# Patient Record
Sex: Female | Born: 1950 | Race: White | Hispanic: No | Marital: Married | Smoking: Never smoker
Health system: Southern US, Community
[De-identification: ages and names within clinical notes are randomized; demographics above are authoritative.]

## PROBLEM LIST (undated history)

## (undated) DIAGNOSIS — N183 Chronic kidney disease, stage 3 (moderate): Secondary | ICD-10-CM

## (undated) DIAGNOSIS — D631 Anemia in chronic kidney disease: Secondary | ICD-10-CM

## (undated) DIAGNOSIS — D8989 Other specified disorders involving the immune mechanism, not elsewhere classified: Secondary | ICD-10-CM

## (undated) DIAGNOSIS — C801 Malignant (primary) neoplasm, unspecified: Secondary | ICD-10-CM

## (undated) DIAGNOSIS — N289 Disorder of kidney and ureter, unspecified: Secondary | ICD-10-CM

## (undated) DIAGNOSIS — I10 Essential (primary) hypertension: Secondary | ICD-10-CM

## (undated) DIAGNOSIS — Z7189 Other specified counseling: Secondary | ICD-10-CM

## (undated) HISTORY — PX: NO PAST SURGERIES: SHX2092

## (undated) HISTORY — DX: Other specified disorders involving the immune mechanism, not elsewhere classified: D89.89

## (undated) HISTORY — DX: Other specified counseling: Z71.89

## (undated) HISTORY — DX: Chronic kidney disease, stage 3 (moderate): N18.3

## (undated) HISTORY — DX: Anemia in chronic kidney disease: D63.1

---

## 2016-09-30 ENCOUNTER — Ambulatory Visit: Payer: Self-pay | Admitting: Hematology & Oncology

## 2016-09-30 ENCOUNTER — Ambulatory Visit: Payer: Medicare (Managed Care)

## 2016-09-30 ENCOUNTER — Ambulatory Visit (HOSPITAL_BASED_OUTPATIENT_CLINIC_OR_DEPARTMENT_OTHER): Payer: Medicare (Managed Care) | Admitting: Hematology & Oncology

## 2016-09-30 ENCOUNTER — Other Ambulatory Visit: Payer: Self-pay

## 2016-09-30 ENCOUNTER — Other Ambulatory Visit (HOSPITAL_BASED_OUTPATIENT_CLINIC_OR_DEPARTMENT_OTHER): Payer: Medicare (Managed Care)

## 2016-09-30 VITALS — BP 128/74 | HR 80 | Temp 97.7°F | Resp 16 | Ht 60.0 in | Wt 95.0 lb

## 2016-09-30 DIAGNOSIS — R634 Abnormal weight loss: Secondary | ICD-10-CM | POA: Diagnosis not present

## 2016-09-30 DIAGNOSIS — D72819 Decreased white blood cell count, unspecified: Secondary | ICD-10-CM | POA: Diagnosis not present

## 2016-09-30 DIAGNOSIS — F0390 Unspecified dementia without behavioral disturbance: Secondary | ICD-10-CM | POA: Diagnosis not present

## 2016-09-30 DIAGNOSIS — D709 Neutropenia, unspecified: Secondary | ICD-10-CM | POA: Diagnosis not present

## 2016-09-30 DIAGNOSIS — C9000 Multiple myeloma not having achieved remission: Secondary | ICD-10-CM | POA: Diagnosis not present

## 2016-09-30 DIAGNOSIS — Z992 Dependence on renal dialysis: Secondary | ICD-10-CM

## 2016-09-30 DIAGNOSIS — D649 Anemia, unspecified: Secondary | ICD-10-CM | POA: Diagnosis not present

## 2016-09-30 DIAGNOSIS — N19 Unspecified kidney failure: Secondary | ICD-10-CM

## 2016-09-30 LAB — CBC WITH DIFFERENTIAL (CANCER CENTER ONLY)
BASO#: 0 10*3/uL (ref 0.0–0.2)
BASO%: 0 % (ref 0.0–2.0)
EOS%: 0 % (ref 0.0–7.0)
Eosinophils Absolute: 0 10*3/uL (ref 0.0–0.5)
HEMATOCRIT: 23.5 % — AB (ref 34.8–46.6)
HGB: 7.7 g/dL — ABNORMAL LOW (ref 11.6–15.9)
LYMPH#: 0.4 10*3/uL — AB (ref 0.9–3.3)
LYMPH%: 25.2 % (ref 14.0–48.0)
MCH: 30 pg (ref 26.0–34.0)
MCHC: 32.8 g/dL (ref 32.0–36.0)
MCV: 91 fL (ref 81–101)
MONO#: 0.2 10*3/uL (ref 0.1–0.9)
MONO%: 11.9 % (ref 0.0–13.0)
NEUT%: 62.9 % (ref 39.6–80.0)
NEUTROS ABS: 0.9 10*3/uL — AB (ref 1.5–6.5)
Platelets: 91 10*3/uL — ABNORMAL LOW (ref 145–400)
RBC: 2.57 10*6/uL — ABNORMAL LOW (ref 3.70–5.32)
RDW: 13.5 % (ref 11.1–15.7)
WBC: 1.4 10*3/uL — ABNORMAL LOW (ref 3.9–10.0)

## 2016-09-30 LAB — CMP (CANCER CENTER ONLY)
ALK PHOS: 245 U/L — AB (ref 26–84)
ALT: 48 U/L — AB (ref 10–47)
AST: 29 U/L (ref 11–38)
Albumin: 3.3 g/dL (ref 3.3–5.5)
BILIRUBIN TOTAL: 0.6 mg/dL (ref 0.20–1.60)
BUN: 42 mg/dL — AB (ref 7–22)
CO2: 27 meq/L (ref 18–33)
CREATININE: 2.2 mg/dL — AB (ref 0.6–1.2)
Calcium: 8.3 mg/dL (ref 8.0–10.3)
Chloride: 98 mEq/L (ref 98–108)
GLUCOSE: 89 mg/dL (ref 73–118)
Potassium: 3.2 mEq/L — ABNORMAL LOW (ref 3.3–4.7)
SODIUM: 138 meq/L (ref 128–145)
Total Protein: 8.7 g/dL — ABNORMAL HIGH (ref 6.4–8.1)

## 2016-09-30 LAB — IRON AND TIBC
%SAT: 59 % — ABNORMAL HIGH (ref 21–57)
Iron: 140 ug/dL (ref 41–142)
TIBC: 240 ug/dL (ref 236–444)
UIBC: 100 ug/dL — AB (ref 120–384)

## 2016-09-30 LAB — FERRITIN

## 2016-09-30 LAB — CHCC SATELLITE - SMEAR

## 2016-09-30 MED ORDER — FAMCICLOVIR 250 MG PO TABS
250.0000 mg | ORAL_TABLET | Freq: Every day | ORAL | 8 refills | Status: DC
Start: 2016-09-30 — End: 2017-07-04

## 2016-09-30 MED ORDER — DEXAMETHASONE 4 MG PO TABS: ORAL_TABLET | ORAL | 1 refills | Status: DC

## 2016-09-30 MED ORDER — MEGESTROL ACETATE 625 MG/5ML PO SUSP
625.0000 mg | Freq: Every day | ORAL | 4 refills | Status: DC
Start: 2016-09-30 — End: 2016-10-25

## 2016-09-30 NOTE — Progress Notes (Signed)
Referral MD  Reason for Referral: IgA  Kappa myeloma                                   ARF - on HD  Chief Complaint  Patient presents with  . New Patient (Initial Visit)  : Patient really cannot give much history.  HPI: Mrs. Gaillard is a very nice 66 year old white female. She actually just got moved down here from [facility]. She apparently has been in decent health until about 2 or 3 weeks ago. She had confusion. She was living with her husband. She was not eating. She's lost quite a bit overweight. She is not urinating much. Patient went to a local hospital. She was found to be in acute renal failure. Studies ultimately show that she had myeloma. She had bony lesions on bone survey. She had a CT scan which confirmed bony lytic lesions. She was hypercalcemic. Her BUN was 190 and her creatinine was 13.  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 She had over 1200 mg/dL of IgA protein. Her Kappa Lightchain was 388 mg/dL. In her urine, her Kappa Lightchain was 8000 mg/L.  She was started on dialysis area and I think she may have had 1 or 2 treatments.  Her son lives down here. He is a Art therapist were for the [employer] police department.  She really cannot give too much history because of progressive dementia over the past couple years.  She had a bone marrow test done. I do not have these results.  She was started on  treatment with Velcade/Cytoxan/Decadron. She's only had 1 or 2 treatments.  Her big problem is the weight loss. She now weighs 95 pounds.  She's not taking much in the way of pain medication.  She was seen orthopedist next week for the compression fractures in her back area and she may be a candidate for kyphoplasty.  She's had no diarrhea. She's had no cough. Again she had a little pneumonia when she was hospitalized up in [place].  She and her husband have moved down here. It sounds like this will be a permanent move for them.  There are, he referred to the [facility] for evaluation and management.  She'll start her dialysis tomorrow. Her last dialysis was on Monday. She really has only been in town for a couple days.   She's had no fever.   She has been transfused. Her family is not sure how much she has been transfused.   She's had no leg swelling.   Overall, I would say performance status is ECOG 2-3.   No past medical history on file.:  No past surgical history on file.:   Current Outpatient Prescriptions:  .  allopurinol (ZYLOPRIM) 100 MG tablet, Take 100 mg by mouth daily., Disp: , Rfl: 11 .  cefUROXime (CEFTIN) 250 MG tablet, TAKE 1 TABLET (250 MG TOTAL) BY MOUTH 2 (TWO) TIMES A DAY FOR 8 DOSES., Disp: , Rfl: 0 .  dexamethasone (DECADRON) 4 MG tablet, Take 5 pills once a week with food., Disp: 100 tablet, Rfl: 1 .  famciclovir (FAMVIR) 250 MG tablet, Take 1 tablet (250 mg total) by mouth daily., Disp: 30 tablet, Rfl: 8 .  megestrol (MEGACE ES) 625 MG/5ML suspension, Take 5 mLs (625 mg total) by mouth daily., Disp: 150 mL, Rfl: 4 .  oxyCODONE-acetaminophen (PERCOCET/ROXICET) 5-325 MG tablet, TAKE 1 TABLET BY MOUTH EVERY 4 HOURS AS NEEDED FOR MODERATE PAIN MAX DAILY AMOUNT 6 TABLETS, Disp: , Rfl: 0 .  Risedronate Sodium (ATELVIA) 35 MG TBEC, Atelvia 35 mg tablet,delayed release  Take 1 tablet every week by oral route., Disp: , Rfl: :  :  Allergies   Allergen  Reactions  . No Known Allergies   :  No family history on file.:  Social History   Social History  . Marital status: Married    Spouse name: N/A  . Number of children: N/A  . Years of education: N/A   Occupational History  . Not on file.   Social History Main Topics  . Smoking status: Not on file  . Smokeless tobacco: Not on file  . Alcohol use Not on file  . Drug use: Unknown  . Sexual activity: Not on file   Other Topics Concern  . Not on file   Social History Narrative  . No narrative on file  :  Pertinent items are noted in HPI.  Exam:Thin, somewhat elderly appearing white female in no obvious distress. Vital signs are temperature 97.7. Pulse 80. Blood pressure 128/74. Weight is 95 pounds. Head exam shows some temporal muscle wasting. She has no adenopathy in the neck. There is no scleral icterus. Lungs are relatively clear bilaterally. Cardiac exam regular rate and rhythm with no murmurs, rubs or bruits. Abdomen is soft. She has no fluid wave. There is no palpable abdominal mass. There is no palpable liver or spleen tip. Back exam shows most logically in the back. She has no tenderness over the spine. Extremities shows some a/renal per lower extremities. She has decent range of motion of her joints. Neurological exam shows no obvious neurological deficits. Skin exam shows some scattered ecchymoses.   Recent Labs  09/30/16 1131  WBC 1.4*  HGB 7.7*  HCT 23.5*  PLT 91*    Recent Labs  09/30/16 1131  NA 138  K 3.2*  CL 98  CO2 27  GLUCOSE 89  BUN 42*  CREATININE 2.2*  CALCIUM 8.3    Blood smear review:  None  Pathology: None     Assessment and Plan:  Mrs. Clinkscales is a 66 year old white female. She has advanced myeloma. She has renal failure. She presented with renal failure. I suspect that she will probably be on dialysis long term.  Is clear that it is her light chains that are the problem. She has a very high level of Kappa  Lightchains. Kappa Lightchains are the sub-type of light chain that really can cause kidney failure. Again, I suspect that she probably is not going to get off of dialysis even if we normalize her light chains.  I think that Velcade/Cytoxan/Decadron would be a very good idea for her. Cytoxan we can use with renal failure. Velcade we can dose for renal failure.  One issue is just her weight loss. She really has lost a tunnel weight. I'll put her on some Megace to see this can help a little bit.  We will try to get started next week. We really need to get started. I would use twice a week Velcade with her. I think this would be helpful to try to bring down the myeloma levels more quickly.  We will have to follow-up on her lab work. She is leukopenic. She is anemic and thrombocytopenic. Again we have to check her blood counts before we treat her. We may have to consider a transfusion.  This is an incredibly complicated case. A lot is going on. She can clearly end of in the hospital with complications.  I spent over an hour with she and her family. There are all very nice. It was really fun talking to them about [facility]. She actually lived close to where I lived when I did  my internal medicine training at the [facility].  We are basically going to have to follow her weekly for right now.

## 2016-10-01 LAB — IGG, IGA, IGM
IGM (IMMUNOGLOBIN M), SRM: 14 mg/dL — AB (ref 26–217)
IgG, Qn, Serum: 300 mg/dL — ABNORMAL LOW (ref 700–1600)

## 2016-10-01 LAB — BETA 2 MICROGLOBULIN, SERUM: Beta-2: 4.3 mg/L — ABNORMAL HIGH (ref 0.6–2.4)

## 2016-10-03 LAB — KAPPA/LAMBDA LIGHT CHAINS
IG KAPPA FREE LIGHT CHAIN: 71.9 mg/L — AB (ref 3.3–19.4)
Ig Lambda Free Light Chain: 1.8 mg/L — ABNORMAL LOW (ref 5.7–26.3)
KAPPA/LAMBDA FLC RATIO: 39.94 — AB (ref 0.26–1.65)

## 2016-10-04 ENCOUNTER — Ambulatory Visit (HOSPITAL_BASED_OUTPATIENT_CLINIC_OR_DEPARTMENT_OTHER): Payer: Medicare (Managed Care)

## 2016-10-04 VITALS — BP 109/65 | HR 101 | Temp 97.8°F | Resp 18

## 2016-10-04 DIAGNOSIS — Z5112 Encounter for antineoplastic immunotherapy: Secondary | ICD-10-CM

## 2016-10-04 DIAGNOSIS — Z5111 Encounter for antineoplastic chemotherapy: Secondary | ICD-10-CM | POA: Diagnosis not present

## 2016-10-04 DIAGNOSIS — C9000 Multiple myeloma not having achieved remission: Secondary | ICD-10-CM

## 2016-10-04 MED ORDER — PALONOSETRON HCL INJECTION 0.25 MG/5ML
INTRAVENOUS | Status: AC
  Filled 2016-10-04: qty 5

## 2016-10-04 MED ORDER — SODIUM CHLORIDE 0.9 % IV SOLN
300.0000 mg/m2 | Freq: Once | INTRAVENOUS | Status: AC
  Administered 2016-10-04: 420 mg via INTRAVENOUS
  Filled 2016-10-04: qty 21

## 2016-10-04 MED ORDER — PALONOSETRON HCL INJECTION 0.25 MG/5ML
0.2500 mg | Freq: Once | INTRAVENOUS | Status: AC
  Administered 2016-10-04: 0.25 mg via INTRAVENOUS

## 2016-10-04 MED ORDER — BORTEZOMIB CHEMO SQ INJECTION 3.5 MG (2.5MG/ML)
1.3000 mg/m2 | Freq: Once | INTRAMUSCULAR | Status: AC
  Administered 2016-10-04: 1.75 mg via SUBCUTANEOUS
  Filled 2016-10-04: qty 1.75

## 2016-10-04 NOTE — Patient Instructions (Signed)
Cyclophosphamide injection What is this medicine? CYCLOPHOSPHAMIDE (sye kloe FOSS fa mide) is a chemotherapy drug. It slows the growth of cancer cells. This medicine is used to treat many types of cancer like lymphoma, myeloma, leukemia, breast cancer, and ovarian cancer, to name a few. This medicine may be used for other purposes; ask your health care provider or pharmacist if you have questions. COMMON BRAND NAME(S): Cytoxan, Neosar What should I tell my health care provider before I take this medicine? They need to know if you have any of these conditions: -blood disorders -history of other chemotherapy -infection -kidney disease -liver disease -recent or ongoing radiation therapy -tumors in the bone marrow -an unusual or allergic reaction to cyclophosphamide, other chemotherapy, other medicines, foods, dyes, or preservatives -pregnant or trying to get pregnant -breast-feeding How should I use this medicine? This drug is usually given as an injection into a vein or muscle or by infusion into a vein. It is administered in a hospital or clinic by a specially trained health care professional. Talk to your pediatrician regarding the use of this medicine in children. Special care may be needed. Overdosage: If you think you have taken too much of this medicine contact a poison control center or emergency room at once. NOTE: This medicine is only for you. Do not share this medicine with others. What if I miss a dose? It is important not to miss your dose. Call your doctor or health care professional if you are unable to keep an appointment. What may interact with this medicine? This medicine may interact with the following medications: -amiodarone -amphotericin B -azathioprine -certain antiviral medicines for HIV or AIDS such as protease inhibitors (e.g., indinavir, ritonavir) and zidovudine -certain blood pressure medications such as benazepril, captopril, enalapril, fosinopril,  lisinopril, moexipril, monopril, perindopril, quinapril, ramipril, trandolapril -certain cancer medications such as anthracyclines (e.g., daunorubicin, doxorubicin), busulfan, cytarabine, paclitaxel, pentostatin, tamoxifen, trastuzumab -certain diuretics such as chlorothiazide, chlorthalidone, hydrochlorothiazide, indapamide, metolazone -certain medicines that treat or prevent blood clots like warfarin -certain muscle relaxants such as succinylcholine -cyclosporine -etanercept -indomethacin -medicines to increase blood counts like filgrastim, pegfilgrastim, sargramostim -medicines used as general anesthesia -metronidazole -natalizumab This list may not describe all possible interactions. Give your health care provider a list of all the medicines, herbs, non-prescription drugs, or dietary supplements you use. Also tell them if you smoke, drink alcohol, or use illegal drugs. Some items may interact with your medicine. What should I watch for while using this medicine? Visit your doctor for checks on your progress. This drug may make you feel generally unwell. This is not uncommon, as chemotherapy can affect healthy cells as well as cancer cells. Report any side effects. Continue your course of treatment even though you feel ill unless your doctor tells you to stop. Drink water or other fluids as directed. Urinate often, even at night. In some cases, you may be given additional medicines to help with side effects. Follow all directions for their use. Call your doctor or health care professional for advice if you get a fever, chills or sore throat, or other symptoms of a cold or flu. Do not treat yourself. This drug decreases your body's ability to fight infections. Try to avoid being around people who are sick. This medicine may increase your risk to bruise or bleed. Call your doctor or health care professional if you notice any unusual bleeding. Be careful brushing and flossing your teeth or using a  toothpick because you may get an infection or bleed   more easily. If you have any dental work done, tell your dentist you are receiving this medicine. You may get drowsy or dizzy. Do not drive, use machinery, or do anything that needs mental alertness until you know how this medicine affects you. Do not become pregnant while taking this medicine or for 1 year after stopping it. Women should inform their doctor if they wish to become pregnant or think they might be pregnant. Men should not father a child while taking this medicine and for 4 months after stopping it. There is a potential for serious side effects to an unborn child. Talk to your health care professional or pharmacist for more information. Do not breast-feed an infant while taking this medicine. This medicine may interfere with the ability to have a child. This medicine has caused ovarian failure in some women. This medicine has caused reduced sperm counts in some men. You should talk with your doctor or health care professional if you are concerned about your fertility. If you are going to have surgery, tell your doctor or health care professional that you have taken this medicine. What side effects may I notice from receiving this medicine? Side effects that you should report to your doctor or health care professional as soon as possible: -allergic reactions like skin rash, itching or hives, swelling of the face, lips, or tongue -low blood counts - this medicine may decrease the number of white blood cells, red blood cells and platelets. You may be at increased risk for infections and bleeding. -signs of infection - fever or chills, cough, sore throat, pain or difficulty passing urine -signs of decreased platelets or bleeding - bruising, pinpoint red spots on the skin, black, tarry stools, blood in the urine -signs of decreased red blood cells - unusually weak or tired, fainting spells, lightheadedness -breathing problems -dark  urine -dizziness -palpitations -swelling of the ankles, feet, hands -trouble passing urine or change in the amount of urine -weight gain -yellowing of the eyes or skin Side effects that usually do not require medical attention (report to your doctor or health care professional if they continue or are bothersome): -changes in nail or skin color -hair loss -missed menstrual periods -mouth sores -nausea, vomiting This list may not describe all possible side effects. Call your doctor for medical advice about side effects. You may report side effects to FDA at 1-800-FDA-1088. Where should I keep my medicine? This drug is given in a hospital or clinic and will not be stored at home. NOTE: This sheet is a summary. It may not cover all possible information. If you have questions about this medicine, talk to your doctor, pharmacist, or health care provider.  2018 Elsevier/Gold Standard (2012-01-27 16:22:58) Bortezomib injection What is this medicine? BORTEZOMIB (bor TEZ oh mib) is a medicine that targets proteins in cancer cells and stops the cancer cells from growing. It is used to treat multiple myeloma and mantle-cell lymphoma. This medicine may be used for other purposes; ask your health care provider or pharmacist if you have questions. COMMON BRAND NAME(S): Velcade What should I tell my health care provider before I take this medicine? They need to know if you have any of these conditions: -diabetes -heart disease -irregular heartbeat -liver disease -on hemodialysis -low blood counts, like low white blood cells, platelets, or hemoglobin -peripheral neuropathy -taking medicine for blood pressure -an unusual or allergic reaction to bortezomib, mannitol, boron, other medicines, foods, dyes, or preservatives -pregnant or trying to get pregnant -breast-feeding How should  I use this medicine? This medicine is for injection into a vein or for injection under the skin. It is given by a  health care professional in a hospital or clinic setting. Talk to your pediatrician regarding the use of this medicine in children. Special care may be needed. Overdosage: If you think you have taken too much of this medicine contact a poison control center or emergency room at once. NOTE: This medicine is only for you. Do not share this medicine with others. What if I miss a dose? It is important not to miss your dose. Call your doctor or health care professional if you are unable to keep an appointment. What may interact with this medicine? This medicine may interact with the following medications: -ketoconazole -rifampin -ritonavir -St. John's Wort This list may not describe all possible interactions. Give your health care provider a list of all the medicines, herbs, non-prescription drugs, or dietary supplements you use. Also tell them if you smoke, drink alcohol, or use illegal drugs. Some items may interact with your medicine. What should I watch for while using this medicine? You may get drowsy or dizzy. Do not drive, use machinery, or do anything that needs mental alertness until you know how this medicine affects you. Do not stand or sit up quickly, especially if you are an older patient. This reduces the risk of dizzy or fainting spells. In some cases, you may be given additional medicines to help with side effects. Follow all directions for their use. Call your doctor or health care professional for advice if you get a fever, chills or sore throat, or other symptoms of a cold or flu. Do not treat yourself. This drug decreases your body's ability to fight infections. Try to avoid being around people who are sick. This medicine may increase your risk to bruise or bleed. Call your doctor or health care professional if you notice any unusual bleeding. You may need blood work done while you are taking this medicine. In some patients, this medicine may cause a serious brain infection that may  cause death. If you have any problems seeing, thinking, speaking, walking, or standing, tell your doctor right away. If you cannot reach your doctor, urgently seek other source of medical care. Check with your doctor or health care professional if you get an attack of severe diarrhea, nausea and vomiting, or if you sweat a lot. The loss of too much body fluid can make it dangerous for you to take this medicine. Do not become pregnant while taking this medicine or for at least 2 months after stopping it. Women should inform their doctor if they wish to become pregnant or think they might be pregnant. Men should not father a child while taking this medicine and for at least 2 months after stopping it. There is a potential for serious side effects to an unborn child. Talk to your health care professional or pharmacist for more information. Do not breast-feed an infant while taking this medicine or for 2 months after stopping it. This medicine may interfere with the ability to have a child. You should talk with your doctor or health care professional if you are concerned about your fertility. What side effects may I notice from receiving this medicine? Side effects that you should report to your doctor or health care professional as soon as possible: -allergic reactions like skin rash, itching or hives, swelling of the face, lips, or tongue -breathing problems -changes in hearing -changes in vision -fast,   irregular heartbeat -feeling faint or lightheaded, falls -pain, tingling, numbness in the hands or feet -right upper belly pain -seizures -swelling of the ankles, feet, hands -unusual bleeding or bruising -unusually weak or tired -vomiting -yellowing of the eyes or skin Side effects that usually do not require medical attention (report to your doctor or health care professional if they continue or are bothersome): -changes in emotions or moods -constipation -diarrhea -loss of  appetite -headache -irritation at site where injected -nausea This list may not describe all possible side effects. Call your doctor for medical advice about side effects. You may report side effects to FDA at 1-800-FDA-1088. Where should I keep my medicine? This drug is given in a hospital or clinic and will not be stored at home. NOTE: This sheet is a summary. It may not cover all possible information. If you have questions about this medicine, talk to your doctor, pharmacist, or health care provider.  2018 Elsevier/Gold Standard (2016-02-11 15:53:51)  

## 2016-10-04 NOTE — Progress Notes (Signed)
OK per Dr Marin Olp to treat patient with Cytoxan/Velcade with low WBC, Hgb & Platelets. dph

## 2016-10-05 ENCOUNTER — Telehealth: Payer: Self-pay

## 2016-10-05 LAB — PROTEIN ELECTROPHORESIS, SERUM, WITH REFLEX
A/G Ratio: 0.8 (ref 0.7–1.7)
ALPHA 2: 0.9 g/dL (ref 0.4–1.0)
Albumin: 3.5 g/dL (ref 2.9–4.4)
Alpha 1: 0.4 g/dL (ref 0.0–0.4)
Beta: 3 g/dL — ABNORMAL HIGH (ref 0.7–1.3)
GAMMA GLOBULIN: 0.2 g/dL — AB (ref 0.4–1.8)
GLOBULIN, TOTAL: 4.5 g/dL — AB (ref 2.2–3.9)
INTERPRETATION(SEE BELOW): 0
M-SPIKE, %: 2 g/dL — AB
Total Protein: 8 g/dL (ref 6.0–8.5)

## 2016-10-05 NOTE — Telephone Encounter (Signed)
Message left on generic VM to contact office re: chemo f/u call for 1st time Cytoxan/Velcade at our office yesterday. (Patient was previously treated with same in [place].) dph

## 2016-10-06 ENCOUNTER — Ambulatory Visit (HOSPITAL_BASED_OUTPATIENT_CLINIC_OR_DEPARTMENT_OTHER): Payer: Medicare (Managed Care)

## 2016-10-06 VITALS — BP 104/60 | HR 92 | Temp 98.2°F | Resp 16

## 2016-10-06 DIAGNOSIS — Z5112 Encounter for antineoplastic immunotherapy: Secondary | ICD-10-CM

## 2016-10-06 DIAGNOSIS — C9000 Multiple myeloma not having achieved remission: Secondary | ICD-10-CM | POA: Diagnosis not present

## 2016-10-06 MED ORDER — BORTEZOMIB CHEMO SQ INJECTION 3.5 MG (2.5MG/ML)
1.3000 mg/m2 | Freq: Once | INTRAMUSCULAR | Status: AC
  Administered 2016-10-06: 1.75 mg via SUBCUTANEOUS
  Filled 2016-10-06: qty 1.75

## 2016-10-06 NOTE — Patient Instructions (Signed)

## 2016-10-07 ENCOUNTER — Ambulatory Visit: Payer: Medicare (Managed Care)

## 2016-10-10 ENCOUNTER — Emergency Department (HOSPITAL_COMMUNITY)
Admission: EM | Admit: 2016-10-10 | Discharge: 2016-10-10 | Disposition: A | Discharge status: Home / Self Care | Payer: Medicare Other | Attending: Physician Assistant | Admitting: Physician Assistant

## 2016-10-10 ENCOUNTER — Encounter (HOSPITAL_COMMUNITY): Payer: Self-pay

## 2016-10-10 ENCOUNTER — Other Ambulatory Visit: Payer: Self-pay | Admitting: *Deleted

## 2016-10-10 DIAGNOSIS — C9000 Multiple myeloma not having achieved remission: Secondary | ICD-10-CM

## 2016-10-10 DIAGNOSIS — R42 Dizziness and giddiness: Secondary | ICD-10-CM | POA: Insufficient documentation

## 2016-10-10 DIAGNOSIS — R531 Weakness: Secondary | ICD-10-CM | POA: Diagnosis present

## 2016-10-10 DIAGNOSIS — E860 Dehydration: Secondary | ICD-10-CM | POA: Insufficient documentation

## 2016-10-10 DIAGNOSIS — I10 Essential (primary) hypertension: Secondary | ICD-10-CM | POA: Diagnosis not present

## 2016-10-10 HISTORY — DX: Malignant (primary) neoplasm, unspecified: C80.1

## 2016-10-10 HISTORY — DX: Disorder of kidney and ureter, unspecified: N28.9

## 2016-10-10 HISTORY — DX: Essential (primary) hypertension: I10

## 2016-10-10 NOTE — Discharge Instructions (Signed)
We think that dialysis overtook fluids today. We are glad you are feeling better. Please follow-up with your physicians.

## 2016-10-10 NOTE — ED Notes (Signed)
ED Provider at bedside. 

## 2016-10-10 NOTE — Progress Notes (Signed)
The note has been done.

## 2016-10-10 NOTE — ED Triage Notes (Addendum)
Pt from dialysis via EMS for dizziness and weakness. Per EMS, pt began dialysis 2 weeks ago with today being pt first full treatment of dialysis. Pt reports she was feeling fine until the end of her treatment when pt began feeling dizzy/faint/and near synocpal. Denies CP, SOB, N/V, or LOC. 100/50, 104 ST, 18 RR, 99% on RA, 123 CBG. Pt A&Ox4. NAD noted. Per paperwork, pt BP 119/73 prior to treatment.

## 2016-10-10 NOTE — ED Provider Notes (Signed)
[facility] DEPT Provider Note   CSN: [id number] Arrival date & time: 10/10/16  1830     History   Chief Complaint Chief Complaint  Patient presents with  . Weakness  . Dizziness    HPI Laurian Edrington is a 66 y.o. female.  HPI   Well Appearing 66 year old female resenting with lightheadedness. Patient recently moved here from [facility]. Recent diagnosis of multiple myeloma and kidney failure. On dialysis for the last 2 weeks. Today was the first full dialysis. At dialysis they took off too much fluid, she became symptomatic. There were closing, and sent her here for further evaluation. They gave back 500 mL of fluid before transferring her.  Patient feels baseline now. Normal vital signs.  Past Medical History:  Diagnosis Date  . Cancer ([place])    multiple myeloma  . Hypertension   . Renal disorder    acute renal failure due to myeloma    There are no active problems to display for this patient.   History reviewed. No pertinent surgical history.  OB History    No data available       Home Medications    Prior to Admission medications   Not on File    Family History No family history on file.  Social History Social History  Substance Use Topics  . Smoking status: Never Smoker  . Smokeless tobacco: Never Used  . Alcohol use No     Allergies   Patient has no known allergies.   Review of Systems Review of Systems  Constitutional: Negative for activity change.  Respiratory: Negative for shortness of breath.   Cardiovascular: Negative for chest pain.  Gastrointestinal: Negative for abdominal pain.     Physical Exam Updated Vital Signs BP 103/68   Pulse 98   Temp 98.2 F (36.8 C) (Oral)   Resp (!) 29   Ht 5' 3"  (1.6 m)   Wt 43.6 kg (96 lb 1.9 oz)   SpO2 100%   BMI 17.03 kg/m   Physical Exam  Constitutional: She is oriented to person, place, and time. She appears well-developed and well-nourished.  HENT:  Head: Normocephalic and  atraumatic.  Eyes: Right eye exhibits no discharge.  Neck:  Port in place with sututres in place  Cardiovascular: Normal rate, regular rhythm and normal heart sounds.   No murmur heard. Pulmonary/Chest: Effort normal and breath sounds normal. She has no wheezes. She has no rales.  Abdominal: Soft. She exhibits no distension. There is no tenderness.  Neurological: She is oriented to person, place, and time.  Skin: Skin is warm and dry. She is not diaphoretic.  Psychiatric: She has a normal mood and affect.  Nursing note and vitals reviewed.    ED Treatments / Results  Labs (all labs ordered are listed, but only abnormal results are displayed) Labs Reviewed  CBG MONITORING, ED    EKG  EKG Interpretation None       Radiology No results found.  Procedures Procedures (including critical care time)  Medications Ordered in ED Medications - No data to display   Initial Impression / Assessment and Plan / ED Course  I have reviewed the triage vital signs and the nursing notes.  Pertinent labs & imaging results that were available during my care of the patient were reviewed by me and considered in my medical decision making (see chart for details).     Well Appearing 66 year old female resenting with lightheadedness. Patient recently moved here from [facility]. Recent diagnosis of multiple myeloma  and kidney failure. On dialysis for the last 2 weeks. Today was the first full dialysis. At dialysis they took off too much fluid, she became symptomatic. There were closing, and sent her here for further evaluation. They gave back 500 mL of fluid before transferring her.  Patient feels baseline now. Normal vital signs.  Will discharge since she is back to [place], no complaints.   Final Clinical Impressions(s) / ED Diagnoses   Final diagnoses:  None    New Prescriptions New Prescriptions   No medications on file     Macarthur Critchley, MD 10/10/16 1952

## 2016-10-11 ENCOUNTER — Ambulatory Visit (HOSPITAL_BASED_OUTPATIENT_CLINIC_OR_DEPARTMENT_OTHER): Payer: Medicare (Managed Care)

## 2016-10-11 ENCOUNTER — Other Ambulatory Visit (HOSPITAL_BASED_OUTPATIENT_CLINIC_OR_DEPARTMENT_OTHER): Payer: Medicare (Managed Care)

## 2016-10-11 VITALS — BP 94/58 | HR 102 | Temp 97.8°F | Resp 18

## 2016-10-11 DIAGNOSIS — C9000 Multiple myeloma not having achieved remission: Secondary | ICD-10-CM

## 2016-10-11 DIAGNOSIS — Z5112 Encounter for antineoplastic immunotherapy: Secondary | ICD-10-CM

## 2016-10-11 DIAGNOSIS — Z5111 Encounter for antineoplastic chemotherapy: Secondary | ICD-10-CM

## 2016-10-11 LAB — CMP (CANCER CENTER ONLY)
ALT(SGPT): 18 U/L (ref 10–47)
AST: 23 U/L (ref 11–38)
Albumin: 3.2 g/dL — ABNORMAL LOW (ref 3.3–5.5)
Alkaline Phosphatase: 474 U/L — ABNORMAL HIGH (ref 26–84)
BUN, Bld: 10 mg/dL (ref 7–22)
CO2: 27 meq/L (ref 18–33)
CREATININE: 1.5 mg/dL — AB (ref 0.6–1.2)
Calcium: 8.7 mg/dL (ref 8.0–10.3)
Chloride: 102 mEq/L (ref 98–108)
GLUCOSE: 111 mg/dL (ref 73–118)
POTASSIUM: 4.2 meq/L (ref 3.3–4.7)
SODIUM: 137 meq/L (ref 128–145)
Total Bilirubin: 0.6 mg/dl (ref 0.20–1.60)
Total Protein: 7.7 g/dL (ref 6.4–8.1)

## 2016-10-11 LAB — CBC WITH DIFFERENTIAL (CANCER CENTER ONLY)
BASO#: 0 10*3/uL (ref 0.0–0.2)
BASO%: 0.4 % (ref 0.0–2.0)
EOS ABS: 0 10*3/uL (ref 0.0–0.5)
EOS%: 0 % (ref 0.0–7.0)
HCT: 25.7 % — ABNORMAL LOW (ref 34.8–46.6)
HGB: 8.3 g/dL — ABNORMAL LOW (ref 11.6–15.9)
LYMPH#: 0.3 10*3/uL — ABNORMAL LOW (ref 0.9–3.3)
LYMPH%: 11.9 % — AB (ref 14.0–48.0)
MCH: 30.6 pg (ref 26.0–34.0)
MCHC: 32.3 g/dL (ref 32.0–36.0)
MCV: 95 fL (ref 81–101)
MONO#: 0.3 10*3/uL (ref 0.1–0.9)
MONO%: 12.3 % (ref 0.0–13.0)
NEUT#: 1.8 10*3/uL (ref 1.5–6.5)
NEUT%: 75.4 % (ref 39.6–80.0)
RBC: 2.71 10*6/uL — AB (ref 3.70–5.32)
RDW: 14.7 % (ref 11.1–15.7)
WBC: 2.4 10*3/uL — AB (ref 3.9–10.0)

## 2016-10-11 MED ORDER — BORTEZOMIB CHEMO SQ INJECTION 3.5 MG (2.5MG/ML)
1.3000 mg/m2 | Freq: Once | INTRAMUSCULAR | Status: AC
  Administered 2016-10-11: 1.75 mg via SUBCUTANEOUS
  Filled 2016-10-11: qty 1.75

## 2016-10-11 MED ORDER — PALONOSETRON HCL INJECTION 0.25 MG/5ML
0.2500 mg | Freq: Once | INTRAVENOUS | Status: AC
  Administered 2016-10-11: 0.25 mg via INTRAVENOUS

## 2016-10-11 MED ORDER — PALONOSETRON HCL INJECTION 0.25 MG/5ML
INTRAVENOUS | Status: AC
  Filled 2016-10-11: qty 5

## 2016-10-11 MED ORDER — SODIUM CHLORIDE 0.9 % IV SOLN
300.0000 mg/m2 | Freq: Once | INTRAVENOUS | Status: AC
  Administered 2016-10-11: 420 mg via INTRAVENOUS
  Filled 2016-10-11: qty 21

## 2016-10-11 NOTE — Patient Instructions (Signed)
Bortezomib injection What is this medicine? BORTEZOMIB (bor TEZ oh mib) is a medicine that targets proteins in cancer cells and stops the cancer cells from growing. It is used to treat multiple myeloma and mantle-cell lymphoma. This medicine may be used for other purposes; ask your health care provider or pharmacist if you have questions. COMMON BRAND NAME(S): Velcade What should I tell my health care provider before I take this medicine? They need to know if you have any of these conditions: -diabetes -heart disease -irregular heartbeat -liver disease -on hemodialysis -low blood counts, like low white blood cells, platelets, or hemoglobin -peripheral neuropathy -taking medicine for blood pressure -an unusual or allergic reaction to bortezomib, mannitol, boron, other medicines, foods, dyes, or preservatives -pregnant or trying to get pregnant -breast-feeding How should I use this medicine? This medicine is for injection into a vein or for injection under the skin. It is given by a health care professional in a hospital or clinic setting. Talk to your pediatrician regarding the use of this medicine in children. Special care may be needed. Overdosage: If you think you have taken too much of this medicine contact a poison control center or emergency room at once. NOTE: This medicine is only for you. Do not share this medicine with others. What if I miss a dose? It is important not to miss your dose. Call your doctor or health care professional if you are unable to keep an appointment. What may interact with this medicine? This medicine may interact with the following medications: -ketoconazole -rifampin -ritonavir -St. John's Wort This list may not describe all possible interactions. Give your health care provider a list of all the medicines, herbs, non-prescription drugs, or dietary supplements you use. Also tell them if you smoke, drink alcohol, or use illegal drugs. Some items may  interact with your medicine. What should I watch for while using this medicine? You may get drowsy or dizzy. Do not drive, use machinery, or do anything that needs mental alertness until you know how this medicine affects you. Do not stand or sit up quickly, especially if you are an older patient. This reduces the risk of dizzy or fainting spells. In some cases, you may be given additional medicines to help with side effects. Follow all directions for their use. Call your doctor or health care professional for advice if you get a fever, chills or sore throat, or other symptoms of a cold or flu. Do not treat yourself. This drug decreases your body's ability to fight infections. Try to avoid being around people who are sick. This medicine may increase your risk to bruise or bleed. Call your doctor or health care professional if you notice any unusual bleeding. You may need blood work done while you are taking this medicine. In some patients, this medicine may cause a serious brain infection that may cause death. If you have any problems seeing, thinking, speaking, walking, or standing, tell your doctor right away. If you cannot reach your doctor, urgently seek other source of medical care. Check with your doctor or health care professional if you get an attack of severe diarrhea, nausea and vomiting, or if you sweat a lot. The loss of too much body fluid can make it dangerous for you to take this medicine. Do not become pregnant while taking this medicine or for at least 2 months after stopping it. Women should inform their doctor if they wish to become pregnant or think they might be pregnant. Men should not  father a child while taking this medicine and for at least 2 months after stopping it. There is a potential for serious side effects to an unborn child. Talk to your health care professional or pharmacist for more information. Do not breast-feed an infant while taking this medicine or for 2 months after  stopping it. This medicine may interfere with the ability to have a child. You should talk with your doctor or health care professional if you are concerned about your fertility. What side effects may I notice from receiving this medicine? Side effects that you should report to your doctor or health care professional as soon as possible: -allergic reactions like skin rash, itching or hives, swelling of the face, lips, or tongue -breathing problems -changes in hearing -changes in vision -fast, irregular heartbeat -feeling faint or lightheaded, falls -pain, tingling, numbness in the hands or feet -right upper belly pain -seizures -swelling of the ankles, feet, hands -unusual bleeding or bruising -unusually weak or tired -vomiting -yellowing of the eyes or skin Side effects that usually do not require medical attention (report to your doctor or health care professional if they continue or are bothersome): -changes in emotions or moods -constipation -diarrhea -loss of appetite -headache -irritation at site where injected -nausea This list may not describe all possible side effects. Call your doctor for medical advice about side effects. You may report side effects to FDA at 1-800-FDA-1088. Where should I keep my medicine? This drug is given in a hospital or clinic and will not be stored at home. NOTE: This sheet is a summary. It may not cover all possible information. If you have questions about this medicine, talk to your doctor, pharmacist, or health care provider.  2018 Elsevier/Gold Standard (2016-02-11 15:53:51) Cyclophosphamide injection What is this medicine? CYCLOPHOSPHAMIDE (sye kloe FOSS fa mide) is a chemotherapy drug. It slows the growth of cancer cells. This medicine is used to treat many types of cancer like lymphoma, myeloma, leukemia, breast cancer, and ovarian cancer, to name a few. This medicine may be used for other purposes; ask your health care provider or  pharmacist if you have questions. COMMON BRAND NAME(S): Cytoxan, Neosar What should I tell my health care provider before I take this medicine? They need to know if you have any of these conditions: -blood disorders -history of other chemotherapy -infection -kidney disease -liver disease -recent or ongoing radiation therapy -tumors in the bone marrow -an unusual or allergic reaction to cyclophosphamide, other chemotherapy, other medicines, foods, dyes, or preservatives -pregnant or trying to get pregnant -breast-feeding How should I use this medicine? This drug is usually given as an injection into a vein or muscle or by infusion into a vein. It is administered in a hospital or clinic by a specially trained health care professional. Talk to your pediatrician regarding the use of this medicine in children. Special care may be needed. Overdosage: If you think you have taken too much of this medicine contact a poison control center or emergency room at once. NOTE: This medicine is only for you. Do not share this medicine with others. What if I miss a dose? It is important not to miss your dose. Call your doctor or health care professional if you are unable to keep an appointment. What may interact with this medicine? This medicine may interact with the following medications: -amiodarone -amphotericin B -azathioprine -certain antiviral medicines for HIV or AIDS such as protease inhibitors (e.g., indinavir, ritonavir) and zidovudine -certain blood pressure medications such as   benazepril, captopril, enalapril, fosinopril, lisinopril, moexipril, monopril, perindopril, quinapril, ramipril, trandolapril -certain cancer medications such as anthracyclines (e.g., daunorubicin, doxorubicin), busulfan, cytarabine, paclitaxel, pentostatin, tamoxifen, trastuzumab -certain diuretics such as chlorothiazide, chlorthalidone, hydrochlorothiazide, indapamide, metolazone -certain medicines that treat or  prevent blood clots like warfarin -certain muscle relaxants such as succinylcholine -cyclosporine -etanercept -indomethacin -medicines to increase blood counts like filgrastim, pegfilgrastim, sargramostim -medicines used as general anesthesia -metronidazole -natalizumab This list may not describe all possible interactions. Give your health care provider a list of all the medicines, herbs, non-prescription drugs, or dietary supplements you use. Also tell them if you smoke, drink alcohol, or use illegal drugs. Some items may interact with your medicine. What should I watch for while using this medicine? Visit your doctor for checks on your progress. This drug may make you feel generally unwell. This is not uncommon, as chemotherapy can affect healthy cells as well as cancer cells. Report any side effects. Continue your course of treatment even though you feel ill unless your doctor tells you to stop. Drink water or other fluids as directed. Urinate often, even at night. In some cases, you may be given additional medicines to help with side effects. Follow all directions for their use. Call your doctor or health care professional for advice if you get a fever, chills or sore throat, or other symptoms of a cold or flu. Do not treat yourself. This drug decreases your body's ability to fight infections. Try to avoid being around people who are sick. This medicine may increase your risk to bruise or bleed. Call your doctor or health care professional if you notice any unusual bleeding. Be careful brushing and flossing your teeth or using a toothpick because you may get an infection or bleed more easily. If you have any dental work done, tell your dentist you are receiving this medicine. You may get drowsy or dizzy. Do not drive, use machinery, or do anything that needs mental alertness until you know how this medicine affects you. Do not become pregnant while taking this medicine or for 1 year after  stopping it. Women should inform their doctor if they wish to become pregnant or think they might be pregnant. Men should not father a child while taking this medicine and for 4 months after stopping it. There is a potential for serious side effects to an unborn child. Talk to your health care professional or pharmacist for more information. Do not breast-feed an infant while taking this medicine. This medicine may interfere with the ability to have a child. This medicine has caused ovarian failure in some women. This medicine has caused reduced sperm counts in some men. You should talk with your doctor or health care professional if you are concerned about your fertility. If you are going to have surgery, tell your doctor or health care professional that you have taken this medicine. What side effects may I notice from receiving this medicine? Side effects that you should report to your doctor or health care professional as soon as possible: -allergic reactions like skin rash, itching or hives, swelling of the face, lips, or tongue -low blood counts - this medicine may decrease the number of white blood cells, red blood cells and platelets. You may be at increased risk for infections and bleeding. -signs of infection - fever or chills, cough, sore throat, pain or difficulty passing urine -signs of decreased platelets or bleeding - bruising, pinpoint red spots on the skin, black, tarry stools, blood in the urine -signs  of decreased red blood cells - unusually weak or tired, fainting spells, lightheadedness -breathing problems -dark urine -dizziness -palpitations -swelling of the ankles, feet, hands -trouble passing urine or change in the amount of urine -weight gain -yellowing of the eyes or skin Side effects that usually do not require medical attention (report to your doctor or health care professional if they continue or are bothersome): -changes in nail or skin color -hair loss -missed  menstrual periods -mouth sores -nausea, vomiting This list may not describe all possible side effects. Call your doctor for medical advice about side effects. You may report side effects to FDA at 1-800-FDA-1088. Where should I keep my medicine? This drug is given in a hospital or clinic and will not be stored at home. NOTE: This sheet is a summary. It may not cover all possible information. If you have questions about this medicine, talk to your doctor, pharmacist, or health care provider.  2018 Elsevier/Gold Standard (2012-01-27 16:22:58)

## 2016-10-13 ENCOUNTER — Ambulatory Visit (HOSPITAL_BASED_OUTPATIENT_CLINIC_OR_DEPARTMENT_OTHER): Payer: Medicare (Managed Care)

## 2016-10-13 ENCOUNTER — Ambulatory Visit: Payer: Self-pay | Admitting: Hematology & Oncology

## 2016-10-13 ENCOUNTER — Other Ambulatory Visit: Payer: Self-pay

## 2016-10-13 VITALS — BP 91/51 | HR 81 | Temp 97.9°F | Resp 16 | Wt 93.2 lb

## 2016-10-13 DIAGNOSIS — C9000 Multiple myeloma not having achieved remission: Secondary | ICD-10-CM | POA: Diagnosis not present

## 2016-10-13 DIAGNOSIS — Z5112 Encounter for antineoplastic immunotherapy: Secondary | ICD-10-CM

## 2016-10-13 MED ORDER — PROCHLORPERAZINE MALEATE 10 MG PO TABS: 10.0000 mg | ORAL_TABLET | Freq: Once | ORAL | Status: DC

## 2016-10-13 MED ORDER — BORTEZOMIB CHEMO SQ INJECTION 3.5 MG (2.5MG/ML)
1.3000 mg/m2 | Freq: Once | INTRAMUSCULAR | Status: AC
  Administered 2016-10-13: 1.75 mg via SUBCUTANEOUS
  Filled 2016-10-13: qty 1.75

## 2016-10-13 MED ORDER — PROCHLORPERAZINE MALEATE 10 MG PO TABS
ORAL_TABLET | ORAL | Status: AC
  Filled 2016-10-13: qty 1

## 2016-10-13 NOTE — Patient Instructions (Signed)
[facility] Discharge Instructions for Patients Receiving Chemotherapy  Today you received the following chemotherapy agents Velcade. To help prevent nausea and vomiting after your treatment, we encourage you to take your nausea medication as directed.  If you develop nausea and vomiting that is not controlled by your nausea medication, call the clinic.   BELOW ARE SYMPTOMS THAT SHOULD BE REPORTED IMMEDIATELY:  *FEVER GREATER THAN 100.5 F  *CHILLS WITH OR WITHOUT FEVER  NAUSEA AND VOMITING THAT IS NOT CONTROLLED WITH YOUR NAUSEA MEDICATION  *UNUSUAL SHORTNESS OF BREATH  *UNUSUAL BRUISING OR BLEEDING  TENDERNESS IN MOUTH AND THROAT WITH OR WITHOUT PRESENCE OF ULCERS  *URINARY PROBLEMS  *BOWEL PROBLEMS  UNUSUAL RASH Items with * indicate a potential emergency and should be followed up as soon as possible.  Feel free to call the clinic you have any questions or concerns. The clinic phone number is [phone].  Please show the CHEMO ALERT CARD at check-in to the Emergency Department and triage nurse.    

## 2016-10-25 ENCOUNTER — Other Ambulatory Visit (HOSPITAL_BASED_OUTPATIENT_CLINIC_OR_DEPARTMENT_OTHER): Payer: Medicare (Managed Care)

## 2016-10-25 ENCOUNTER — Ambulatory Visit (HOSPITAL_BASED_OUTPATIENT_CLINIC_OR_DEPARTMENT_OTHER): Payer: Medicare (Managed Care) | Admitting: Hematology & Oncology

## 2016-10-25 ENCOUNTER — Other Ambulatory Visit (HOSPITAL_COMMUNITY): Payer: Self-pay

## 2016-10-25 ENCOUNTER — Ambulatory Visit (HOSPITAL_BASED_OUTPATIENT_CLINIC_OR_DEPARTMENT_OTHER): Payer: Medicare (Managed Care)

## 2016-10-25 VITALS — BP 99/66 | HR 89 | Temp 97.7°F | Resp 17 | Wt 95.0 lb

## 2016-10-25 DIAGNOSIS — C9000 Multiple myeloma not having achieved remission: Secondary | ICD-10-CM

## 2016-10-25 DIAGNOSIS — Z5112 Encounter for antineoplastic immunotherapy: Secondary | ICD-10-CM | POA: Diagnosis not present

## 2016-10-25 DIAGNOSIS — Z992 Dependence on renal dialysis: Secondary | ICD-10-CM | POA: Diagnosis not present

## 2016-10-25 DIAGNOSIS — Z5111 Encounter for antineoplastic chemotherapy: Secondary | ICD-10-CM

## 2016-10-25 LAB — CMP (CANCER CENTER ONLY)
ALBUMIN: 3.3 g/dL (ref 3.3–5.5)
ALT(SGPT): 13 U/L (ref 10–47)
AST: 21 U/L (ref 11–38)
Alkaline Phosphatase: 391 U/L — ABNORMAL HIGH (ref 26–84)
BUN, Bld: 16 mg/dL (ref 7–22)
CHLORIDE: 103 meq/L (ref 98–108)
CO2: 26 meq/L (ref 18–33)
CREATININE: 1 mg/dL (ref 0.6–1.2)
Calcium: 7.9 mg/dL — ABNORMAL LOW (ref 8.0–10.3)
Glucose, Bld: 96 mg/dL (ref 73–118)
POTASSIUM: 3.3 meq/L (ref 3.3–4.7)
SODIUM: 139 meq/L (ref 128–145)
TOTAL PROTEIN: 6.8 g/dL (ref 6.4–8.1)
Total Bilirubin: 0.9 mg/dl (ref 0.20–1.60)

## 2016-10-25 LAB — CBC WITH DIFFERENTIAL (CANCER CENTER ONLY)
BASO#: 0 10*3/uL (ref 0.0–0.2)
BASO%: 0.8 % (ref 0.0–2.0)
EOS ABS: 0 10*3/uL (ref 0.0–0.5)
EOS%: 0.3 % (ref 0.0–7.0)
HCT: 31.4 % — ABNORMAL LOW (ref 34.8–46.6)
HGB: 10 g/dL — ABNORMAL LOW (ref 11.6–15.9)
LYMPH#: 0.3 10*3/uL — ABNORMAL LOW (ref 0.9–3.3)
LYMPH%: 8.7 % — AB (ref 14.0–48.0)
MCH: 31.2 pg (ref 26.0–34.0)
MCHC: 31.8 g/dL — AB (ref 32.0–36.0)
MCV: 98 fL (ref 81–101)
MONO#: 0.3 10*3/uL (ref 0.1–0.9)
MONO%: 8.7 % (ref 0.0–13.0)
NEUT#: 3.2 10*3/uL (ref 1.5–6.5)
NEUT%: 81.5 % — ABNORMAL HIGH (ref 39.6–80.0)
PLATELETS: 236 10*3/uL (ref 145–400)
RBC: 3.21 10*6/uL — AB (ref 3.70–5.32)
RDW: 18.7 % — ABNORMAL HIGH (ref 11.1–15.7)
WBC: 3.9 10*3/uL (ref 3.9–10.0)

## 2016-10-25 MED ORDER — PALONOSETRON HCL INJECTION 0.25 MG/5ML
0.2500 mg | Freq: Once | INTRAVENOUS | Status: AC
  Administered 2016-10-25: 0.25 mg via INTRAVENOUS

## 2016-10-25 MED ORDER — SODIUM CHLORIDE 0.9 % IV SOLN
300.0000 mg/m2 | Freq: Once | INTRAVENOUS | Status: AC
  Administered 2016-10-25: 420 mg via INTRAVENOUS
  Filled 2016-10-25: qty 21

## 2016-10-25 MED ORDER — BORTEZOMIB CHEMO SQ INJECTION 3.5 MG (2.5MG/ML)
1.3000 mg/m2 | Freq: Once | INTRAMUSCULAR | Status: AC
  Administered 2016-10-25: 1.75 mg via SUBCUTANEOUS
  Filled 2016-10-25: qty 1.75

## 2016-10-25 MED ORDER — PALONOSETRON HCL INJECTION 0.25 MG/5ML
INTRAVENOUS | Status: AC
  Filled 2016-10-25: qty 5

## 2016-10-25 NOTE — Patient Instructions (Signed)
[facility] Discharge Instructions for Patients Receiving Chemotherapy  Today you received the following chemotherapy agents Cytoxan and Velcade.  To help prevent nausea and vomiting after your treatment, we encourage you to take your nausea medication as directed.  NO ZOFRAN FOR 3 DAYS   If you develop nausea and vomiting that is not controlled by your nausea medication, call the clinic.   BELOW ARE SYMPTOMS THAT SHOULD BE REPORTED IMMEDIATELY:  *FEVER GREATER THAN 100.5 F  *CHILLS WITH OR WITHOUT FEVER  NAUSEA AND VOMITING THAT IS NOT CONTROLLED WITH YOUR NAUSEA MEDICATION  *UNUSUAL SHORTNESS OF BREATH  *UNUSUAL BRUISING OR BLEEDING  TENDERNESS IN MOUTH AND THROAT WITH OR WITHOUT PRESENCE OF ULCERS  *URINARY PROBLEMS  *BOWEL PROBLEMS  UNUSUAL RASH Items with * indicate a potential emergency and should be followed up as soon as possible.  Feel free to call the clinic you have any questions or concerns. The clinic phone number is [phone].  Please show the [place] at check-in to the Emergency Department and triage nurse.

## 2016-10-25 NOTE — Progress Notes (Signed)
Hematology and Oncology Follow Up Visit  Jeanne Martinez [id number] 16-Feb-1951 66 y.o. 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 Sparlin is back for her second office visit. We first saw her a few weeks ago. At that time, she had just moved down from [place]. She was diagnosed up in [place] with IgA kappa myeloma. She had renal failure. She is on hemodialysis.  We first saw her, her M spike was 2 g/dL. Her IgA level was 2671 milligrams per deciliter. Her Kappa Lightchain was 7.2 mg/dL.  She sees be doing better. She has not had dialysis now for a week. She is urinating well.  She is not hurting as much.  She really has not gotten sick from treatment to date.  There's been no rashes. She's had no diarrhea. She's had no cough. She's had no shortness of breath. There's been no mouth sores.  Currently, her performance status is ECOG 2.  Medications:  Current Outpatient Prescriptions:  .  allopurinol (ZYLOPRIM) 100 MG tablet, Take 100 mg by mouth daily., Disp: , Rfl: 11 .  dexamethasone (DECADRON) 4 MG tablet, Take 5 pills once a week with food., Disp: 100 tablet, Rfl: 1 .  famciclovir (FAMVIR) 250 MG tablet, Take 1 tablet (250 mg total) by mouth daily., Disp: 30 tablet, Rfl: 8 .  oxyCODONE-acetaminophen (PERCOCET/ROXICET) 5-325 MG tablet, TAKE 1 TABLET BY MOUTH EVERY 4 HOURS AS NEEDED FOR MODERATE PAIN MAX DAILY AMOUNT 6 TABLETS, Disp: , Rfl: 0 .  Risedronate Sodium (ATELVIA) 35 MG TBEC, Atelvia 35 mg tablet,delayed release  Take 1 tablet every week by oral route., Disp: , Rfl:   Allergies:  Allergies  Allergen Reactions  . No Known Allergies     Past Medical History, Surgical history, Social history, and Family History were reviewed and updated.  Review of Systems:  As above  Physical Exam:  weight is 95 lb (43.1 kg). Her oral  temperature is 97.7 F (36.5 C). Her blood pressure is 99/66 and her pulse is 89. Her respiration is 17 and oxygen saturation is 100%.   Wt Readings from Last 3 Encounters:  10/25/16 95 lb (43.1 kg)  10/13/16 93 lb 4 oz (42.3 kg)  10/10/16 96 lb 1.9 oz (43.6 kg)      Head and neck exam shows some temporal muscle wasting. She has no adenopathy in the neck. There is no scleral icterus. Lungs are relatively clear bilaterally. Cardiac exam regular rate and rhythm with no murmurs, rubs or bruits. Abdomen is soft. She has no fluid wave. There is no palpable abdominal mass. There is no palpable liver or spleen tip. Back exam shows most logically in the back. She has no tenderness over the spine. Extremities shows some a/renal per lower extremities. She has decent range of motion of her joints. Neurological exam shows no obvious neurological deficits. Skin exam shows some no petechia. There may be a few ecchymoses.  Lab Results  Component Value Date   WBC 3.9 10/25/2016   HGB 10.0 (L) 10/25/2016   HCT 31.4 (L) 10/25/2016   MCV 98 10/25/2016   PLT 236 10/25/2016     Chemistry      Component Value Date/Time   NA 139 10/25/2016 1211   K 3.3 10/25/2016 1211   CL 103 10/25/2016 1211   CO2 26 10/25/2016 1211  BUN 16 10/25/2016 1211   CREATININE 1.0 10/25/2016 1211      Component Value Date/Time   CALCIUM 7.9 (L) 10/25/2016 1211   ALKPHOS 391 (H) 10/25/2016 1211   AST 21 10/25/2016 1211   ALT 13 10/25/2016 1211   BILITOT 0.90 10/25/2016 1211         Impression and Plan: Ms. Blaney is a 66 year old white female with IgA kappa myeloma. She had renal failure. This does seem to be improving.  I'm glad that her total protein is coming down. Hopefully, this will be a good indicator that the myeloma M spike is improving.  We will continue her on the twice weekly Velcade for right now.  As we see continued improvement in the myeloma, we will hopefully be able to decrease her Velcade to  weekly dosing.  We will plan to get her back in another 3 weeks.   Volanda Napoleon, MD 7/31/20181:22 PM

## 2016-10-26 ENCOUNTER — Ambulatory Visit (HOSPITAL_COMMUNITY)
Admission: RE | Admit: 2016-10-26 | Discharge: 2016-10-26 | Disposition: A | Discharge status: Home / Self Care | Payer: Medicare (Managed Care) | Source: Ambulatory Visit | Attending: Nephrology | Admitting: Nephrology

## 2016-10-26 DIAGNOSIS — Z452 Encounter for adjustment and management of vascular access device: Secondary | ICD-10-CM | POA: Diagnosis present

## 2016-10-26 LAB — KAPPA/LAMBDA LIGHT CHAINS
Ig Kappa Free Light Chain: 11.2 mg/L (ref 3.3–19.4)
Ig Lambda Free Light Chain: 2.5 mg/L — ABNORMAL LOW (ref 5.7–26.3)
Kappa/Lambda FluidC Ratio: 4.48 — ABNORMAL HIGH (ref 0.26–1.65)

## 2016-10-26 LAB — IGG, IGA, IGM
IgA, Qn, Serum: 675 mg/dL — ABNORMAL HIGH (ref 87–352)
IgG, Qn, Serum: 352 mg/dL — ABNORMAL LOW (ref 700–1600)
IgM, Qn, Serum: 11 mg/dL — ABNORMAL LOW (ref 26–217)

## 2016-10-27 ENCOUNTER — Ambulatory Visit: Payer: Medicare (Managed Care)

## 2016-10-28 ENCOUNTER — Ambulatory Visit (HOSPITAL_BASED_OUTPATIENT_CLINIC_OR_DEPARTMENT_OTHER): Payer: Medicare (Managed Care)

## 2016-10-28 ENCOUNTER — Ambulatory Visit: Payer: Medicare (Managed Care)

## 2016-10-28 VITALS — BP 119/68 | HR 79 | Temp 98.0°F | Resp 20

## 2016-10-28 DIAGNOSIS — Z5112 Encounter for antineoplastic immunotherapy: Secondary | ICD-10-CM

## 2016-10-28 DIAGNOSIS — C9000 Multiple myeloma not having achieved remission: Secondary | ICD-10-CM | POA: Diagnosis not present

## 2016-10-28 LAB — PROTEIN ELECTROPHORESIS, SERUM, WITH REFLEX
A/G Ratio: 1.3 (ref 0.7–1.7)
ALBUMIN: 3.6 g/dL (ref 2.9–4.4)
ALPHA 1: 0.3 g/dL (ref 0.0–0.4)
ALPHA 2: 0.8 g/dL (ref 0.4–1.0)
BETA: 1.4 g/dL — AB (ref 0.7–1.3)
Gamma Globulin: 0.3 g/dL — ABNORMAL LOW (ref 0.4–1.8)
Globulin, Total: 2.8 g/dL (ref 2.2–3.9)
Interpretation(See Below): 0
M-Spike, %: 0.5 g/dL — ABNORMAL HIGH
Total Protein: 6.4 g/dL (ref 6.0–8.5)

## 2016-10-28 MED ORDER — PROCHLORPERAZINE MALEATE 10 MG PO TABS
ORAL_TABLET | ORAL | Status: AC
  Filled 2016-10-28: qty 1

## 2016-10-28 MED ORDER — PROCHLORPERAZINE MALEATE 10 MG PO TABS
10.0000 mg | ORAL_TABLET | Freq: Once | ORAL | Status: AC
  Administered 2016-10-28: 10 mg via ORAL

## 2016-10-28 MED ORDER — BORTEZOMIB CHEMO SQ INJECTION 3.5 MG (2.5MG/ML)
1.3000 mg/m2 | Freq: Once | INTRAMUSCULAR | Status: AC
  Administered 2016-10-28: 1.75 mg via SUBCUTANEOUS
  Filled 2016-10-28: qty 1.75

## 2016-10-28 NOTE — Patient Instructions (Signed)

## 2016-10-31 ENCOUNTER — Other Ambulatory Visit (HOSPITAL_COMMUNITY): Payer: Self-pay | Admitting: Specialist

## 2016-10-31 ENCOUNTER — Other Ambulatory Visit: Payer: Self-pay | Admitting: *Deleted

## 2016-10-31 DIAGNOSIS — C9000 Multiple myeloma not having achieved remission: Secondary | ICD-10-CM

## 2016-10-31 DIAGNOSIS — M546 Pain in thoracic spine: Secondary | ICD-10-CM

## 2016-11-01 ENCOUNTER — Other Ambulatory Visit (HOSPITAL_BASED_OUTPATIENT_CLINIC_OR_DEPARTMENT_OTHER): Payer: Medicare (Managed Care)

## 2016-11-01 ENCOUNTER — Ambulatory Visit (HOSPITAL_BASED_OUTPATIENT_CLINIC_OR_DEPARTMENT_OTHER): Payer: Medicare (Managed Care)

## 2016-11-01 VITALS — BP 118/68 | HR 83 | Temp 97.8°F | Resp 16

## 2016-11-01 DIAGNOSIS — Z5111 Encounter for antineoplastic chemotherapy: Secondary | ICD-10-CM | POA: Diagnosis not present

## 2016-11-01 DIAGNOSIS — C9000 Multiple myeloma not having achieved remission: Secondary | ICD-10-CM

## 2016-11-01 DIAGNOSIS — Z5112 Encounter for antineoplastic immunotherapy: Secondary | ICD-10-CM

## 2016-11-01 LAB — CMP (CANCER CENTER ONLY)
ALBUMIN: 3.3 g/dL (ref 3.3–5.5)
ALT(SGPT): 16 U/L (ref 10–47)
AST: 25 U/L (ref 11–38)
Alkaline Phosphatase: 241 U/L — ABNORMAL HIGH (ref 26–84)
BUN, Bld: 19 mg/dL (ref 7–22)
CALCIUM: 8.7 mg/dL (ref 8.0–10.3)
CHLORIDE: 105 meq/L (ref 98–108)
CO2: 27 meq/L (ref 18–33)
CREATININE: 1 mg/dL (ref 0.6–1.2)
Glucose, Bld: 85 mg/dL (ref 73–118)
Potassium: 3.8 mEq/L (ref 3.3–4.7)
SODIUM: 137 meq/L (ref 128–145)
Total Bilirubin: 0.9 mg/dl (ref 0.20–1.60)
Total Protein: 6.3 g/dL — ABNORMAL LOW (ref 6.4–8.1)

## 2016-11-01 LAB — CBC WITH DIFFERENTIAL (CANCER CENTER ONLY)
BASO#: 0 10*3/uL (ref 0.0–0.2)
BASO%: 0.2 % (ref 0.0–2.0)
EOS ABS: 0 10*3/uL (ref 0.0–0.5)
EOS%: 0.7 % (ref 0.0–7.0)
HCT: 33.2 % — ABNORMAL LOW (ref 34.8–46.6)
HEMOGLOBIN: 10.6 g/dL — AB (ref 11.6–15.9)
LYMPH#: 0.5 10*3/uL — ABNORMAL LOW (ref 0.9–3.3)
LYMPH%: 12.1 % — ABNORMAL LOW (ref 14.0–48.0)
MCH: 31.4 pg (ref 26.0–34.0)
MCHC: 31.9 g/dL — AB (ref 32.0–36.0)
MCV: 98 fL (ref 81–101)
MONO#: 0.5 10*3/uL (ref 0.1–0.9)
MONO%: 11.4 % (ref 0.0–13.0)
NEUT%: 75.6 % (ref 39.6–80.0)
NEUTROS ABS: 3.1 10*3/uL (ref 1.5–6.5)
Platelets: 158 10*3/uL (ref 145–400)
RBC: 3.38 10*6/uL — ABNORMAL LOW (ref 3.70–5.32)
RDW: 18.8 % — ABNORMAL HIGH (ref 11.1–15.7)
WBC: 4.1 10*3/uL (ref 3.9–10.0)

## 2016-11-01 MED ORDER — BORTEZOMIB CHEMO SQ INJECTION 3.5 MG (2.5MG/ML)
1.3000 mg/m2 | Freq: Once | INTRAMUSCULAR | Status: AC
  Administered 2016-11-01: 1.75 mg via SUBCUTANEOUS
  Filled 2016-11-01: qty 1.75

## 2016-11-01 MED ORDER — SODIUM CHLORIDE 0.9 % IV SOLN
300.0000 mg/m2 | Freq: Once | INTRAVENOUS | Status: AC
  Administered 2016-11-01: 420 mg via INTRAVENOUS
  Filled 2016-11-01: qty 21

## 2016-11-01 MED ORDER — SODIUM CHLORIDE 0.9 % IV SOLN
Freq: Once | INTRAVENOUS | Status: AC
  Administered 2016-11-01: 13:00:00 via INTRAVENOUS

## 2016-11-01 MED ORDER — DENOSUMAB 120 MG/1.7ML ~~LOC~~ SOLN
120.0000 mg | Freq: Once | SUBCUTANEOUS | Status: AC
  Administered 2016-11-01: 120 mg via SUBCUTANEOUS

## 2016-11-01 MED ORDER — DENOSUMAB 120 MG/1.7ML ~~LOC~~ SOLN
SUBCUTANEOUS | Status: AC
  Filled 2016-11-01: qty 1.7

## 2016-11-01 MED ORDER — PALONOSETRON HCL INJECTION 0.25 MG/5ML
0.2500 mg | Freq: Once | INTRAVENOUS | Status: AC
  Administered 2016-11-01: 0.25 mg via INTRAVENOUS

## 2016-11-01 NOTE — Patient Instructions (Signed)
[facility] Discharge Instructions for Patients Receiving Chemotherapy  Today you received the following chemotherapy agents: Cytoxan and Velcade  To help prevent nausea and vomiting after your treatment, we encourage you to take your nausea medication as ordered per MD.    If you develop nausea and vomiting that is not controlled by your nausea medication, call the clinic.   BELOW ARE SYMPTOMS THAT SHOULD BE REPORTED IMMEDIATELY:  *FEVER GREATER THAN 100.5 F  *CHILLS WITH OR WITHOUT FEVER  NAUSEA AND VOMITING THAT IS NOT CONTROLLED WITH YOUR NAUSEA MEDICATION  *UNUSUAL SHORTNESS OF BREATH  *UNUSUAL BRUISING OR BLEEDING  TENDERNESS IN MOUTH AND THROAT WITH OR WITHOUT PRESENCE OF ULCERS  *URINARY PROBLEMS  *BOWEL PROBLEMS  UNUSUAL RASH Items with * indicate a potential emergency and should be followed up as soon as possible.  Feel free to call the clinic you have any questions or concerns. The clinic phone number is [phone].  Please show the [facility] at check-in to the Emergency Department and triage nurse.  Denosumab injection What is this medicine? DENOSUMAB (den oh sue mab) slows bone breakdown. Prolia is used to treat osteoporosis in women after menopause and in men. Delton See is used to treat a high calcium level due to cancer and to prevent bone fractures and other bone problems caused by multiple myeloma or cancer bone metastases. Delton See is also used to treat giant cell tumor of the bone. This medicine may be used for other purposes; ask your health care provider or pharmacist if you have questions. COMMON BRAND NAME(S): Prolia, XGEVA What should I tell my health care provider before I take this medicine? They need to know if you have any of these conditions: -dental disease -having surgery or tooth extraction -infection -kidney disease -low levels of calcium or Vitamin D in the blood -malnutrition -on hemodialysis -skin conditions or  sensitivity -thyroid or parathyroid disease -an unusual reaction to denosumab, other medicines, foods, dyes, or preservatives -pregnant or trying to get pregnant -breast-feeding How should I use this medicine? This medicine is for injection under the skin. It is given by a health care professional in a hospital or clinic setting. If you are getting Prolia, a special MedGuide will be given to you by the pharmacist with each prescription and refill. Be sure to read this information carefully each time. For Prolia, talk to your pediatrician regarding the use of this medicine in children. Special care may be needed. For Delton See, talk to your pediatrician regarding the use of this medicine in children. While this drug may be prescribed for children as young as 13 years for selected conditions, precautions do apply. Overdosage: If you think you have taken too much of this medicine contact a poison control center or emergency room at once. NOTE: This medicine is only for you. Do not share this medicine with others. What if I miss a dose? It is important not to miss your dose. Call your doctor or health care professional if you are unable to keep an appointment. What may interact with this medicine? Do not take this medicine with any of the following medications: -other medicines containing denosumab This medicine may also interact with the following medications: -medicines that lower your chance of fighting infection -steroid medicines like prednisone or cortisone This list may not describe all possible interactions. Give your health care provider a list of all the medicines, herbs, non-prescription drugs, or dietary supplements you use. Also tell them if you smoke, drink  alcohol, or use illegal drugs. Some items may interact with your medicine. What should I watch for while using this medicine? Visit your doctor or health care professional for regular checks on your progress. Your doctor or health care  professional may order blood tests and other tests to see how you are doing. Call your doctor or health care professional for advice if you get a fever, chills or sore throat, or other symptoms of a cold or flu. Do not treat yourself. This drug may decrease your body's ability to fight infection. Try to avoid being around people who are sick. You should make sure you get enough calcium and vitamin D while you are taking this medicine, unless your doctor tells you not to. Discuss the foods you eat and the vitamins you take with your health care professional. See your dentist regularly. Brush and floss your teeth as directed. Before you have any dental work done, tell your dentist you are receiving this medicine. Do not become pregnant while taking this medicine or for 5 months after stopping it. Talk with your doctor or health care professional about your birth control options while taking this medicine. Women should inform their doctor if they wish to become pregnant or think they might be pregnant. There is a potential for serious side effects to an unborn child. Talk to your health care professional or pharmacist for more information. What side effects may I notice from receiving this medicine? Side effects that you should report to your doctor or health care professional as soon as possible: -allergic reactions like skin rash, itching or hives, swelling of the face, lips, or tongue -bone pain -breathing problems -dizziness -jaw pain, especially after dental work -redness, blistering, peeling of the skin -signs and symptoms of infection like fever or chills; cough; sore throat; pain or trouble passing urine -signs of low calcium like fast heartbeat, muscle cramps or muscle pain; pain, tingling, numbness in the hands or feet; seizures -unusual bleeding or bruising -unusually weak or tired Side effects that usually do not require medical attention (report to your doctor or health care professional  if they continue or are bothersome): -constipation -diarrhea -headache -joint pain -loss of appetite -muscle pain -runny nose -tiredness -upset stomach This list may not describe all possible side effects. Call your doctor for medical advice about side effects. You may report side effects to FDA at 1-800-FDA-1088. Where should I keep my medicine? This medicine is only given in a clinic, doctor's office, or other health care setting and will not be stored at home. NOTE: This sheet is a summary. It may not cover all possible information. If you have questions about this medicine, talk to your doctor, pharmacist, or health care provider.  2018 Elsevier/Gold Standard (2016-04-05 19:17:21)

## 2016-11-02 ENCOUNTER — Encounter (HOSPITAL_COMMUNITY)
Admission: RE | Admit: 2016-11-02 | Discharge: 2016-11-02 | Disposition: A | Discharge status: Home / Self Care | Payer: Medicare (Managed Care) | Source: Ambulatory Visit | Attending: Nephrology | Admitting: Nephrology

## 2016-11-02 DIAGNOSIS — Z4800 Encounter for change or removal of nonsurgical wound dressing: Secondary | ICD-10-CM | POA: Diagnosis present

## 2016-11-03 ENCOUNTER — Ambulatory Visit: Payer: Medicare (Managed Care)

## 2016-11-04 ENCOUNTER — Ambulatory Visit (HOSPITAL_BASED_OUTPATIENT_CLINIC_OR_DEPARTMENT_OTHER): Payer: Medicare (Managed Care)

## 2016-11-04 VITALS — BP 117/65 | HR 81 | Temp 97.8°F | Resp 16

## 2016-11-04 DIAGNOSIS — C9000 Multiple myeloma not having achieved remission: Secondary | ICD-10-CM

## 2016-11-04 DIAGNOSIS — Z5112 Encounter for antineoplastic immunotherapy: Secondary | ICD-10-CM

## 2016-11-04 MED ORDER — BORTEZOMIB CHEMO SQ INJECTION 3.5 MG (2.5MG/ML)
1.3000 mg/m2 | Freq: Once | INTRAMUSCULAR | Status: AC
  Administered 2016-11-04: 1.75 mg via SUBCUTANEOUS
  Filled 2016-11-04: qty 1.75

## 2016-11-04 MED ORDER — PROCHLORPERAZINE MALEATE 10 MG PO TABS
10.0000 mg | ORAL_TABLET | Freq: Once | ORAL | Status: AC
  Administered 2016-11-04: 10 mg via ORAL

## 2016-11-04 MED ORDER — PROCHLORPERAZINE MALEATE 10 MG PO TABS
ORAL_TABLET | ORAL | Status: AC
  Filled 2016-11-04: qty 1

## 2016-11-04 NOTE — Patient Instructions (Signed)

## 2016-11-07 ENCOUNTER — Ambulatory Visit (HOSPITAL_COMMUNITY)
Admission: RE | Admit: 2016-11-07 | Discharge: 2016-11-07 | Disposition: A | Discharge status: Home / Self Care | Payer: Medicare (Managed Care) | Source: Ambulatory Visit | Attending: Specialist | Admitting: Specialist

## 2016-11-09 ENCOUNTER — Encounter (HOSPITAL_COMMUNITY): Payer: Medicare (Managed Care)

## 2016-11-15 ENCOUNTER — Other Ambulatory Visit (HOSPITAL_BASED_OUTPATIENT_CLINIC_OR_DEPARTMENT_OTHER): Payer: Medicare Other

## 2016-11-15 ENCOUNTER — Ambulatory Visit (HOSPITAL_BASED_OUTPATIENT_CLINIC_OR_DEPARTMENT_OTHER): Payer: Medicare (Managed Care)

## 2016-11-15 ENCOUNTER — Ambulatory Visit (HOSPITAL_BASED_OUTPATIENT_CLINIC_OR_DEPARTMENT_OTHER): Payer: Medicare (Managed Care) | Admitting: Hematology & Oncology

## 2016-11-15 VITALS — BP 128/68 | HR 84 | Temp 98.1°F | Resp 18 | Wt 98.0 lb

## 2016-11-15 DIAGNOSIS — Z5111 Encounter for antineoplastic chemotherapy: Secondary | ICD-10-CM | POA: Diagnosis not present

## 2016-11-15 DIAGNOSIS — Z5112 Encounter for antineoplastic immunotherapy: Secondary | ICD-10-CM

## 2016-11-15 DIAGNOSIS — N19 Unspecified kidney failure: Secondary | ICD-10-CM

## 2016-11-15 DIAGNOSIS — C9000 Multiple myeloma not having achieved remission: Secondary | ICD-10-CM

## 2016-11-15 LAB — CMP (CANCER CENTER ONLY)
ALK PHOS: 127 U/L — AB (ref 26–84)
ALT: 18 U/L (ref 10–47)
AST: 27 U/L (ref 11–38)
Albumin: 3.6 g/dL (ref 3.3–5.5)
BILIRUBIN TOTAL: 0.9 mg/dL (ref 0.20–1.60)
BUN: 17 mg/dL (ref 7–22)
CALCIUM: 8.6 mg/dL (ref 8.0–10.3)
CO2: 27 meq/L (ref 18–33)
Chloride: 106 mEq/L (ref 98–108)
Creat: 1 mg/dl (ref 0.6–1.2)
GLUCOSE: 95 mg/dL (ref 73–118)
POTASSIUM: 3.2 meq/L — AB (ref 3.3–4.7)
Sodium: 142 mEq/L (ref 128–145)
Total Protein: 6.6 g/dL (ref 6.4–8.1)

## 2016-11-15 LAB — CBC WITH DIFFERENTIAL (CANCER CENTER ONLY)
BASO#: 0 10*3/uL (ref 0.0–0.2)
BASO%: 0.4 % (ref 0.0–2.0)
EOS ABS: 0 10*3/uL (ref 0.0–0.5)
EOS%: 0.8 % (ref 0.0–7.0)
HEMATOCRIT: 36.3 % (ref 34.8–46.6)
HGB: 11.8 g/dL (ref 11.6–15.9)
LYMPH#: 0.7 10*3/uL — ABNORMAL LOW (ref 0.9–3.3)
LYMPH%: 14.6 % (ref 14.0–48.0)
MCH: 31.4 pg (ref 26.0–34.0)
MCHC: 32.5 g/dL (ref 32.0–36.0)
MCV: 97 fL (ref 81–101)
MONO#: 0.5 10*3/uL (ref 0.1–0.9)
MONO%: 10.1 % (ref 0.0–13.0)
NEUT#: 3.5 10*3/uL (ref 1.5–6.5)
NEUT%: 74.1 % (ref 39.6–80.0)
PLATELETS: 324 10*3/uL (ref 145–400)
RBC: 3.76 10*6/uL (ref 3.70–5.32)
RDW: 17.3 % — AB (ref 11.1–15.7)
WBC: 4.7 10*3/uL (ref 3.9–10.0)

## 2016-11-15 MED ORDER — PALONOSETRON HCL INJECTION 0.25 MG/5ML
INTRAVENOUS | Status: AC
  Filled 2016-11-15: qty 5

## 2016-11-15 MED ORDER — SODIUM CHLORIDE 0.9 % IV SOLN
300.0000 mg/m2 | Freq: Once | INTRAVENOUS | Status: AC
  Administered 2016-11-15: 420 mg via INTRAVENOUS
  Filled 2016-11-15: qty 21

## 2016-11-15 MED ORDER — BORTEZOMIB CHEMO SQ INJECTION 3.5 MG (2.5MG/ML)
1.3000 mg/m2 | Freq: Once | INTRAMUSCULAR | Status: AC
  Administered 2016-11-15: 1.75 mg via SUBCUTANEOUS
  Filled 2016-11-15: qty 1.75

## 2016-11-15 MED ORDER — PALONOSETRON HCL INJECTION 0.25 MG/5ML
0.2500 mg | Freq: Once | INTRAVENOUS | Status: AC
Start: 2016-11-15 — End: 2016-11-15
  Administered 2016-11-15: 0.25 mg via INTRAVENOUS

## 2016-11-15 NOTE — Progress Notes (Signed)
Hematology and Oncology Follow Up Visit  Jeanne Martinez [id number] 25-May-1950 66 y.o. 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 It is incredible to see the "transformation" that she has made. We first saw her, she was using a walker. She was incredibly weak. She was losing weight. She was in pain. I think she required dialysis.  Since starting chemotherapy, she has made a virtual 180 turn around. She comes walking in. She is eating well. She is not requiring dialysis. She is urinating on her own.  Her myeloma studies have responded incredibly well. After 1 cycle, her M spike went down to 0.5 g/dL. Her IgA level went from 2600 mg/dL down to 6 75 mg/dL. Her Kappa Lightchain went from 7.2 mg/dL down to 1.1 mg/dL.  She is hungry all the time. She has had no fever. She has had no rash. She has had no problems with diarrhea. She has had no leg swelling.  Currently, her performance status is ECOG 1.  Medications:  Current Outpatient Prescriptions:  .  allopurinol (ZYLOPRIM) 100 MG tablet, Take 100 mg by mouth daily., Disp: , Rfl: 11 .  dexamethasone (DECADRON) 4 MG tablet, Take 5 pills once a week with food., Disp: 100 tablet, Rfl: 1 .  famciclovir (FAMVIR) 250 MG tablet, Take 1 tablet (250 mg total) by mouth daily., Disp: 30 tablet, Rfl: 8 .  oxyCODONE-acetaminophen (PERCOCET/ROXICET) 5-325 MG tablet, TAKE 1 TABLET BY MOUTH EVERY 4 HOURS AS NEEDED FOR MODERATE PAIN MAX DAILY AMOUNT 6 TABLETS, Disp: , Rfl: 0 .  Risedronate Sodium (ATELVIA) 35 MG TBEC, Atelvia 35 mg tablet,delayed release  Take 1 tablet every week by oral route., Disp: , Rfl:   Allergies:  Allergies  Allergen Reactions  . No Known Allergies     Past Medical History, Surgical history, Social history, and Family History were reviewed and updated.  Review of  Systems:  As above  Physical Exam:  weight is 98 lb (44.5 kg). Her oral temperature is 98.1 F (36.7 C). Her blood pressure is 128/68 and her pulse is 84. Her respiration is 18 and oxygen saturation is 100%.   Wt Readings from Last 3 Encounters:  11/15/16 98 lb (44.5 kg)  11/02/16 98 lb 4 oz (44.6 kg)  10/26/16 95 lb 6 oz (43.3 kg)      Head and neck exam shows some temporal muscle wasting. She has no adenopathy in the neck. There is no scleral icterus. Lungs are relatively clear bilaterally. Cardiac exam regular rate and rhythm with no murmurs, rubs or bruits. Abdomen is soft. She has no fluid wave. There is no palpable abdominal mass. There is no palpable liver or spleen tip. Back exam shows most logically in the back. She has no tenderness over the spine. Extremities shows some a/renal per lower extremities. She has decent range of motion of her joints. Neurological exam shows no obvious neurological deficits. Skin exam shows some no petechia. There may be a few ecchymoses.  Lab Results  Component Value Date   WBC 4.7 11/15/2016   HGB 11.8 11/15/2016   HCT 36.3 11/15/2016   MCV 97 11/15/2016   PLT 324 11/15/2016     Chemistry      Component Value Date/Time   NA 142 11/15/2016 1111   K 3.2 (L) 11/15/2016  1111   CL 106 11/15/2016 1111   CO2 27 11/15/2016 1111   BUN 17 11/15/2016 1111   CREATININE 1.0 11/15/2016 1111      Component Value Date/Time   CALCIUM 8.6 11/15/2016 1111   ALKPHOS 127 (H) 11/15/2016 1111   AST 27 11/15/2016 1111   ALT 18 11/15/2016 1111   BILITOT 0.90 11/15/2016 1111         Impression and Plan: Jeanne Martinez is a 66 year old white female with IgA kappa myeloma. She had renal failure. This does seem to be improving.  I'm absolutely impressed with how well she is responded. Her M spike just 3 weeks ago was already down to 0.5 g/dL. Her IgA level was down to 675 mg/dL. Her Kappa Light chain was down to 1.1 mg/dL.   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  She really wants to go back up to [facility]. That is where her home as. Her family brought her down here because she was so weak and is such bad shape. She has recovered incredibly well.  Before I think we can let her go back to [facility], we have to make sure that she is on an oral regimen that would be effective. I think, in her case, we could consider Ninlaro/Pomalidomide.  She, I'm sure, has a hematologist/oncologist up in [place] that could easily take over.  We'll plan to get her back in another 3-4 weeks.  Volanda Napoleon, MD 8/21/201812:12 PM

## 2016-11-15 NOTE — Patient Instructions (Signed)
Bortezomib injection What is this medicine? BORTEZOMIB (bor TEZ oh mib) is a medicine that targets proteins in cancer cells and stops the cancer cells from growing. It is used to treat multiple myeloma and mantle-cell lymphoma. This medicine may be used for other purposes; ask your health care provider or pharmacist if you have questions. COMMON BRAND NAME(S): Velcade What should I tell my health care provider before I take this medicine? They need to know if you have any of these conditions: -diabetes -heart disease -irregular heartbeat -liver disease -on hemodialysis -low blood counts, like low white blood cells, platelets, or hemoglobin -peripheral neuropathy -taking medicine for blood pressure -an unusual or allergic reaction to bortezomib, mannitol, boron, other medicines, foods, dyes, or preservatives -pregnant or trying to get pregnant -breast-feeding How should I use this medicine? This medicine is for injection into a vein or for injection under the skin. It is given by a health care professional in a hospital or clinic setting. Talk to your pediatrician regarding the use of this medicine in children. Special care may be needed. Overdosage: If you think you have taken too much of this medicine contact a poison control center or emergency room at once. NOTE: This medicine is only for you. Do not share this medicine with others. What if I miss a dose? It is important not to miss your dose. Call your doctor or health care professional if you are unable to keep an appointment. What may interact with this medicine? This medicine may interact with the following medications: -ketoconazole -rifampin -ritonavir -St. John's Wort This list may not describe all possible interactions. Give your health care provider a list of all the medicines, herbs, non-prescription drugs, or dietary supplements you use. Also tell them if you smoke, drink alcohol, or use illegal drugs. Some items may  interact with your medicine. What should I watch for while using this medicine? You may get drowsy or dizzy. Do not drive, use machinery, or do anything that needs mental alertness until you know how this medicine affects you. Do not stand or sit up quickly, especially if you are an older patient. This reduces the risk of dizzy or fainting spells. In some cases, you may be given additional medicines to help with side effects. Follow all directions for their use. Call your doctor or health care professional for advice if you get a fever, chills or sore throat, or other symptoms of a cold or flu. Do not treat yourself. This drug decreases your body's ability to fight infections. Try to avoid being around people who are sick. This medicine may increase your risk to bruise or bleed. Call your doctor or health care professional if you notice any unusual bleeding. You may need blood work done while you are taking this medicine. In some patients, this medicine may cause a serious brain infection that may cause death. If you have any problems seeing, thinking, speaking, walking, or standing, tell your doctor right away. If you cannot reach your doctor, urgently seek other source of medical care. Check with your doctor or health care professional if you get an attack of severe diarrhea, nausea and vomiting, or if you sweat a lot. The loss of too much body fluid can make it dangerous for you to take this medicine. Do not become pregnant while taking this medicine or for at least 2 months after stopping it. Women should inform their doctor if they wish to become pregnant or think they might be pregnant. Men should not  father a child while taking this medicine and for at least 2 months after stopping it. There is a potential for serious side effects to an unborn child. Talk to your health care professional or pharmacist for more information. Do not breast-feed an infant while taking this medicine or for 2 months after  stopping it. This medicine may interfere with the ability to have a child. You should talk with your doctor or health care professional if you are concerned about your fertility. What side effects may I notice from receiving this medicine? Side effects that you should report to your doctor or health care professional as soon as possible: -allergic reactions like skin rash, itching or hives, swelling of the face, lips, or tongue -breathing problems -changes in hearing -changes in vision -fast, irregular heartbeat -feeling faint or lightheaded, falls -pain, tingling, numbness in the hands or feet -right upper belly pain -seizures -swelling of the ankles, feet, hands -unusual bleeding or bruising -unusually weak or tired -vomiting -yellowing of the eyes or skin Side effects that usually do not require medical attention (report to your doctor or health care professional if they continue or are bothersome): -changes in emotions or moods -constipation -diarrhea -loss of appetite -headache -irritation at site where injected -nausea This list may not describe all possible side effects. Call your doctor for medical advice about side effects. You may report side effects to FDA at 1-800-FDA-1088. Where should I keep my medicine? This drug is given in a hospital or clinic and will not be stored at home. NOTE: This sheet is a summary. It may not cover all possible information. If you have questions about this medicine, talk to your doctor, pharmacist, or health care provider.  2018 Elsevier/Gold Standard (2016-02-11 15:53:51) Cyclophosphamide injection What is this medicine? CYCLOPHOSPHAMIDE (sye kloe FOSS fa mide) is a chemotherapy drug. It slows the growth of cancer cells. This medicine is used to treat many types of cancer like lymphoma, myeloma, leukemia, breast cancer, and ovarian cancer, to name a few. This medicine may be used for other purposes; ask your health care provider or  pharmacist if you have questions. COMMON BRAND NAME(S): Cytoxan, Neosar What should I tell my health care provider before I take this medicine? They need to know if you have any of these conditions: -blood disorders -history of other chemotherapy -infection -kidney disease -liver disease -recent or ongoing radiation therapy -tumors in the bone marrow -an unusual or allergic reaction to cyclophosphamide, other chemotherapy, other medicines, foods, dyes, or preservatives -pregnant or trying to get pregnant -breast-feeding How should I use this medicine? This drug is usually given as an injection into a vein or muscle or by infusion into a vein. It is administered in a hospital or clinic by a specially trained health care professional. Talk to your pediatrician regarding the use of this medicine in children. Special care may be needed. Overdosage: If you think you have taken too much of this medicine contact a poison control center or emergency room at once. NOTE: This medicine is only for you. Do not share this medicine with others. What if I miss a dose? It is important not to miss your dose. Call your doctor or health care professional if you are unable to keep an appointment. What may interact with this medicine? This medicine may interact with the following medications: -amiodarone -amphotericin B -azathioprine -certain antiviral medicines for HIV or AIDS such as protease inhibitors (e.g., indinavir, ritonavir) and zidovudine -certain blood pressure medications such as   benazepril, captopril, enalapril, fosinopril, lisinopril, moexipril, monopril, perindopril, quinapril, ramipril, trandolapril -certain cancer medications such as anthracyclines (e.g., daunorubicin, doxorubicin), busulfan, cytarabine, paclitaxel, pentostatin, tamoxifen, trastuzumab -certain diuretics such as chlorothiazide, chlorthalidone, hydrochlorothiazide, indapamide, metolazone -certain medicines that treat or  prevent blood clots like warfarin -certain muscle relaxants such as succinylcholine -cyclosporine -etanercept -indomethacin -medicines to increase blood counts like filgrastim, pegfilgrastim, sargramostim -medicines used as general anesthesia -metronidazole -natalizumab This list may not describe all possible interactions. Give your health care provider a list of all the medicines, herbs, non-prescription drugs, or dietary supplements you use. Also tell them if you smoke, drink alcohol, or use illegal drugs. Some items may interact with your medicine. What should I watch for while using this medicine? Visit your doctor for checks on your progress. This drug may make you feel generally unwell. This is not uncommon, as chemotherapy can affect healthy cells as well as cancer cells. Report any side effects. Continue your course of treatment even though you feel ill unless your doctor tells you to stop. Drink water or other fluids as directed. Urinate often, even at night. In some cases, you may be given additional medicines to help with side effects. Follow all directions for their use. Call your doctor or health care professional for advice if you get a fever, chills or sore throat, or other symptoms of a cold or flu. Do not treat yourself. This drug decreases your body's ability to fight infections. Try to avoid being around people who are sick. This medicine may increase your risk to bruise or bleed. Call your doctor or health care professional if you notice any unusual bleeding. Be careful brushing and flossing your teeth or using a toothpick because you may get an infection or bleed more easily. If you have any dental work done, tell your dentist you are receiving this medicine. You may get drowsy or dizzy. Do not drive, use machinery, or do anything that needs mental alertness until you know how this medicine affects you. Do not become pregnant while taking this medicine or for 1 year after  stopping it. Women should inform their doctor if they wish to become pregnant or think they might be pregnant. Men should not father a child while taking this medicine and for 4 months after stopping it. There is a potential for serious side effects to an unborn child. Talk to your health care professional or pharmacist for more information. Do not breast-feed an infant while taking this medicine. This medicine may interfere with the ability to have a child. This medicine has caused ovarian failure in some women. This medicine has caused reduced sperm counts in some men. You should talk with your doctor or health care professional if you are concerned about your fertility. If you are going to have surgery, tell your doctor or health care professional that you have taken this medicine. What side effects may I notice from receiving this medicine? Side effects that you should report to your doctor or health care professional as soon as possible: -allergic reactions like skin rash, itching or hives, swelling of the face, lips, or tongue -low blood counts - this medicine may decrease the number of white blood cells, red blood cells and platelets. You may be at increased risk for infections and bleeding. -signs of infection - fever or chills, cough, sore throat, pain or difficulty passing urine -signs of decreased platelets or bleeding - bruising, pinpoint red spots on the skin, black, tarry stools, blood in the urine -signs  of decreased red blood cells - unusually weak or tired, fainting spells, lightheadedness -breathing problems -dark urine -dizziness -palpitations -swelling of the ankles, feet, hands -trouble passing urine or change in the amount of urine -weight gain -yellowing of the eyes or skin Side effects that usually do not require medical attention (report to your doctor or health care professional if they continue or are bothersome): -changes in nail or skin color -hair loss -missed  menstrual periods -mouth sores -nausea, vomiting This list may not describe all possible side effects. Call your doctor for medical advice about side effects. You may report side effects to FDA at 1-800-FDA-1088. Where should I keep my medicine? This drug is given in a hospital or clinic and will not be stored at home. NOTE: This sheet is a summary. It may not cover all possible information. If you have questions about this medicine, talk to your doctor, pharmacist, or health care provider.  2018 Elsevier/Gold Standard (2012-01-27 16:22:58)

## 2016-11-16 ENCOUNTER — Ambulatory Visit (HOSPITAL_COMMUNITY)
Admission: RE | Admit: 2016-11-16 | Discharge: 2016-11-16 | Disposition: A | Discharge status: Home / Self Care | Payer: Medicare (Managed Care) | Source: Ambulatory Visit | Attending: Specialist | Admitting: Specialist

## 2016-11-16 ENCOUNTER — Telehealth: Payer: Self-pay | Admitting: *Deleted

## 2016-11-16 DIAGNOSIS — M546 Pain in thoracic spine: Secondary | ICD-10-CM

## 2016-11-16 DIAGNOSIS — M8448XA Pathological fracture, other site, initial encounter for fracture: Secondary | ICD-10-CM | POA: Diagnosis not present

## 2016-11-16 DIAGNOSIS — C9000 Multiple myeloma not having achieved remission: Secondary | ICD-10-CM | POA: Insufficient documentation

## 2016-11-16 LAB — KAPPA/LAMBDA LIGHT CHAINS
IG KAPPA FREE LIGHT CHAIN: 5.8 mg/L (ref 3.3–19.4)
Ig Lambda Free Light Chain: 2.5 mg/L — ABNORMAL LOW (ref 5.7–26.3)
Kappa/Lambda FluidC Ratio: 2.32 — ABNORMAL HIGH (ref 0.26–1.65)

## 2016-11-16 LAB — IGG, IGA, IGM
IGG (IMMUNOGLOBIN G), SERUM: 336 mg/dL — AB (ref 700–1600)
IGM (IMMUNOGLOBIN M), SRM: 7 mg/dL — AB (ref 26–217)
IgA, Qn, Serum: 191 mg/dL (ref 87–352)

## 2016-11-16 NOTE — Telephone Encounter (Signed)
Patient's daughter requesting that this office make a referral to neurology. The patient has no PCP.   The daughter is reporting significant issues with patient memory. She will ask the same questions repeatedly, without remembering the discussion. She forgets to eat and perform other ADLs. She recently went on a trip back home, and has little recollection of the events of that trip.   Reviewed symptoms with Dr Marin Olp. He would like patient referred to Dr Wells Guiles Tat  Order placed into [facility]

## 2016-11-17 ENCOUNTER — Ambulatory Visit: Payer: Medicare (Managed Care)

## 2016-11-17 LAB — PROTEIN ELECTROPHORESIS, SERUM, WITH REFLEX
A/G Ratio: 1.6 (ref 0.7–1.7)
ALBUMIN: 3.6 g/dL (ref 2.9–4.4)
ALPHA 2: 0.8 g/dL (ref 0.4–1.0)
Alpha 1: 0.2 g/dL (ref 0.0–0.4)
BETA: 1 g/dL (ref 0.7–1.3)
GAMMA GLOBULIN: 0.3 g/dL — AB (ref 0.4–1.8)
Globulin, Total: 2.3 g/dL (ref 2.2–3.9)
Total Protein: 5.9 g/dL — ABNORMAL LOW (ref 6.0–8.5)

## 2016-11-18 ENCOUNTER — Ambulatory Visit (HOSPITAL_BASED_OUTPATIENT_CLINIC_OR_DEPARTMENT_OTHER): Payer: Medicare Other

## 2016-11-18 VITALS — BP 120/68 | HR 77 | Temp 98.2°F | Resp 18

## 2016-11-18 DIAGNOSIS — Z5112 Encounter for antineoplastic immunotherapy: Secondary | ICD-10-CM | POA: Diagnosis not present

## 2016-11-18 DIAGNOSIS — C9000 Multiple myeloma not having achieved remission: Secondary | ICD-10-CM

## 2016-11-18 MED ORDER — PROCHLORPERAZINE MALEATE 10 MG PO TABS
10.0000 mg | ORAL_TABLET | Freq: Once | ORAL | Status: AC
  Administered 2016-11-18: 10 mg via ORAL

## 2016-11-18 MED ORDER — PROCHLORPERAZINE MALEATE 10 MG PO TABS
ORAL_TABLET | ORAL | Status: AC
  Filled 2016-11-18: qty 1

## 2016-11-18 MED ORDER — BORTEZOMIB CHEMO SQ INJECTION 3.5 MG (2.5MG/ML)
1.3000 mg/m2 | Freq: Once | INTRAMUSCULAR | Status: AC
  Administered 2016-11-18: 1.75 mg via SUBCUTANEOUS
  Filled 2016-11-18: qty 1.75

## 2016-11-18 NOTE — Patient Instructions (Signed)
[facility] Discharge Instructions for Patients Receiving Chemotherapy  Today you received the following chemotherapy agents:  Velcade  To help prevent nausea and vomiting after your treatment, we encourage you to take your nausea medication as ordered per MD.   If you develop nausea and vomiting that is not controlled by your nausea medication, call the clinic.   BELOW ARE SYMPTOMS THAT SHOULD BE REPORTED IMMEDIATELY:  *FEVER GREATER THAN 100.5 F  *CHILLS WITH OR WITHOUT FEVER  NAUSEA AND VOMITING THAT IS NOT CONTROLLED WITH YOUR NAUSEA MEDICATION  *UNUSUAL SHORTNESS OF BREATH  *UNUSUAL BRUISING OR BLEEDING  TENDERNESS IN MOUTH AND THROAT WITH OR WITHOUT PRESENCE OF ULCERS  *URINARY PROBLEMS  *BOWEL PROBLEMS  UNUSUAL RASH Items with * indicate a potential emergency and should be followed up as soon as possible.  Feel free to call the clinic you have any questions or concerns. The clinic phone number is [phone].  Please show the CHEMO ALERT CARD at check-in to the Emergency Department and triage nurse.   

## 2016-11-21 ENCOUNTER — Other Ambulatory Visit: Payer: Self-pay | Admitting: *Deleted

## 2016-11-21 DIAGNOSIS — C9000 Multiple myeloma not having achieved remission: Secondary | ICD-10-CM

## 2016-11-22 ENCOUNTER — Other Ambulatory Visit (HOSPITAL_BASED_OUTPATIENT_CLINIC_OR_DEPARTMENT_OTHER): Payer: Medicare (Managed Care)

## 2016-11-22 ENCOUNTER — Ambulatory Visit (HOSPITAL_BASED_OUTPATIENT_CLINIC_OR_DEPARTMENT_OTHER): Payer: Medicare (Managed Care)

## 2016-11-22 VITALS — BP 126/69 | HR 90 | Temp 97.9°F | Resp 16

## 2016-11-22 DIAGNOSIS — Z5111 Encounter for antineoplastic chemotherapy: Secondary | ICD-10-CM | POA: Diagnosis not present

## 2016-11-22 DIAGNOSIS — C9000 Multiple myeloma not having achieved remission: Secondary | ICD-10-CM | POA: Diagnosis not present

## 2016-11-22 DIAGNOSIS — Z5112 Encounter for antineoplastic immunotherapy: Secondary | ICD-10-CM

## 2016-11-22 LAB — CBC WITH DIFFERENTIAL (CANCER CENTER ONLY)
BASO#: 0 10*3/uL (ref 0.0–0.2)
BASO%: 0.2 % (ref 0.0–2.0)
EOS%: 0.5 % (ref 0.0–7.0)
Eosinophils Absolute: 0 10*3/uL (ref 0.0–0.5)
HCT: 35.3 % (ref 34.8–46.6)
HGB: 11.6 g/dL (ref 11.6–15.9)
LYMPH#: 0.8 10*3/uL — ABNORMAL LOW (ref 0.9–3.3)
LYMPH%: 13.7 % — AB (ref 14.0–48.0)
MCH: 31.4 pg (ref 26.0–34.0)
MCHC: 32.9 g/dL (ref 32.0–36.0)
MCV: 95 fL (ref 81–101)
MONO#: 0.4 10*3/uL (ref 0.1–0.9)
MONO%: 6.9 % (ref 0.0–13.0)
NEUT#: 4.3 10*3/uL (ref 1.5–6.5)
NEUT%: 78.7 % (ref 39.6–80.0)
PLATELETS: 221 10*3/uL (ref 145–400)
RBC: 3.7 10*6/uL (ref 3.70–5.32)
RDW: 16.4 % — AB (ref 11.1–15.7)
WBC: 5.5 10*3/uL (ref 3.9–10.0)

## 2016-11-22 LAB — CMP (CANCER CENTER ONLY)
ALT: 21 U/L (ref 10–47)
AST: 27 U/L (ref 11–38)
Albumin: 3.4 g/dL (ref 3.3–5.5)
Alkaline Phosphatase: 117 U/L — ABNORMAL HIGH (ref 26–84)
BUN: 21 mg/dL (ref 7–22)
CHLORIDE: 107 meq/L (ref 98–108)
CO2: 29 mEq/L (ref 18–33)
CREATININE: 1 mg/dL (ref 0.6–1.2)
Calcium: 8.1 mg/dL (ref 8.0–10.3)
GLUCOSE: 110 mg/dL (ref 73–118)
POTASSIUM: 3.5 meq/L (ref 3.3–4.7)
SODIUM: 142 meq/L (ref 128–145)
TOTAL PROTEIN: 6.3 g/dL — AB (ref 6.4–8.1)
Total Bilirubin: 0.7 mg/dl (ref 0.20–1.60)

## 2016-11-22 MED ORDER — PALONOSETRON HCL INJECTION 0.25 MG/5ML
0.2500 mg | Freq: Once | INTRAVENOUS | Status: AC
  Administered 2016-11-22: 0.25 mg via INTRAVENOUS

## 2016-11-22 MED ORDER — SODIUM CHLORIDE 0.9 % IV SOLN
300.0000 mg/m2 | Freq: Once | INTRAVENOUS | Status: AC
  Administered 2016-11-22: 420 mg via INTRAVENOUS
  Filled 2016-11-22: qty 21

## 2016-11-22 MED ORDER — SODIUM CHLORIDE 0.9 % IV SOLN
Freq: Once | INTRAVENOUS | Status: AC
  Administered 2016-11-22: 14:00:00 via INTRAVENOUS

## 2016-11-22 MED ORDER — BORTEZOMIB CHEMO SQ INJECTION 3.5 MG (2.5MG/ML)
1.3000 mg/m2 | Freq: Once | INTRAMUSCULAR | Status: AC
  Administered 2016-11-22: 1.75 mg via SUBCUTANEOUS
  Filled 2016-11-22: qty 1.75

## 2016-11-22 MED ORDER — PALONOSETRON HCL INJECTION 0.25 MG/5ML
INTRAVENOUS | Status: AC
  Filled 2016-11-22: qty 5

## 2016-11-22 NOTE — Patient Instructions (Signed)
Bortezomib injection What is this medicine? BORTEZOMIB (bor TEZ oh mib) is a medicine that targets proteins in cancer cells and stops the cancer cells from growing. It is used to treat multiple myeloma and mantle-cell lymphoma. This medicine may be used for other purposes; ask your health care provider or pharmacist if you have questions. COMMON BRAND NAME(S): Velcade What should I tell my health care provider before I take this medicine? They need to know if you have any of these conditions: -diabetes -heart disease -irregular heartbeat -liver disease -on hemodialysis -low blood counts, like low white blood cells, platelets, or hemoglobin -peripheral neuropathy -taking medicine for blood pressure -an unusual or allergic reaction to bortezomib, mannitol, boron, other medicines, foods, dyes, or preservatives -pregnant or trying to get pregnant -breast-feeding How should I use this medicine? This medicine is for injection into a vein or for injection under the skin. It is given by a health care professional in a hospital or clinic setting. Talk to your pediatrician regarding the use of this medicine in children. Special care may be needed. Overdosage: If you think you have taken too much of this medicine contact a poison control center or emergency room at once. NOTE: This medicine is only for you. Do not share this medicine with others. What if I miss a dose? It is important not to miss your dose. Call your doctor or health care professional if you are unable to keep an appointment. What may interact with this medicine? This medicine may interact with the following medications: -ketoconazole -rifampin -ritonavir -St. John's Wort This list may not describe all possible interactions. Give your health care provider a list of all the medicines, herbs, non-prescription drugs, or dietary supplements you use. Also tell them if you smoke, drink alcohol, or use illegal drugs. Some items may  interact with your medicine. What should I watch for while using this medicine? You may get drowsy or dizzy. Do not drive, use machinery, or do anything that needs mental alertness until you know how this medicine affects you. Do not stand or sit up quickly, especially if you are an older patient. This reduces the risk of dizzy or fainting spells. In some cases, you may be given additional medicines to help with side effects. Follow all directions for their use. Call your doctor or health care professional for advice if you get a fever, chills or sore throat, or other symptoms of a cold or flu. Do not treat yourself. This drug decreases your body's ability to fight infections. Try to avoid being around people who are sick. This medicine may increase your risk to bruise or bleed. Call your doctor or health care professional if you notice any unusual bleeding. You may need blood work done while you are taking this medicine. In some patients, this medicine may cause a serious brain infection that may cause death. If you have any problems seeing, thinking, speaking, walking, or standing, tell your doctor right away. If you cannot reach your doctor, urgently seek other source of medical care. Check with your doctor or health care professional if you get an attack of severe diarrhea, nausea and vomiting, or if you sweat a lot. The loss of too much body fluid can make it dangerous for you to take this medicine. Do not become pregnant while taking this medicine or for at least 2 months after stopping it. Women should inform their doctor if they wish to become pregnant or think they might be pregnant. Men should not  father a child while taking this medicine and for at least 2 months after stopping it. There is a potential for serious side effects to an unborn child. Talk to your health care professional or pharmacist for more information. Do not breast-feed an infant while taking this medicine or for 2 months after  stopping it. This medicine may interfere with the ability to have a child. You should talk with your doctor or health care professional if you are concerned about your fertility. What side effects may I notice from receiving this medicine? Side effects that you should report to your doctor or health care professional as soon as possible: -allergic reactions like skin rash, itching or hives, swelling of the face, lips, or tongue -breathing problems -changes in hearing -changes in vision -fast, irregular heartbeat -feeling faint or lightheaded, falls -pain, tingling, numbness in the hands or feet -right upper belly pain -seizures -swelling of the ankles, feet, hands -unusual bleeding or bruising -unusually weak or tired -vomiting -yellowing of the eyes or skin Side effects that usually do not require medical attention (report to your doctor or health care professional if they continue or are bothersome): -changes in emotions or moods -constipation -diarrhea -loss of appetite -headache -irritation at site where injected -nausea This list may not describe all possible side effects. Call your doctor for medical advice about side effects. You may report side effects to FDA at 1-800-FDA-1088. Where should I keep my medicine? This drug is given in a hospital or clinic and will not be stored at home. NOTE: This sheet is a summary. It may not cover all possible information. If you have questions about this medicine, talk to your doctor, pharmacist, or health care provider.  2018 Elsevier/Gold Standard (2016-02-11 15:53:51) Cyclophosphamide injection What is this medicine? CYCLOPHOSPHAMIDE (sye kloe FOSS fa mide) is a chemotherapy drug. It slows the growth of cancer cells. This medicine is used to treat many types of cancer like lymphoma, myeloma, leukemia, breast cancer, and ovarian cancer, to name a few. This medicine may be used for other purposes; ask your health care provider or  pharmacist if you have questions. COMMON BRAND NAME(S): Cytoxan, Neosar What should I tell my health care provider before I take this medicine? They need to know if you have any of these conditions: -blood disorders -history of other chemotherapy -infection -kidney disease -liver disease -recent or ongoing radiation therapy -tumors in the bone marrow -an unusual or allergic reaction to cyclophosphamide, other chemotherapy, other medicines, foods, dyes, or preservatives -pregnant or trying to get pregnant -breast-feeding How should I use this medicine? This drug is usually given as an injection into a vein or muscle or by infusion into a vein. It is administered in a hospital or clinic by a specially trained health care professional. Talk to your pediatrician regarding the use of this medicine in children. Special care may be needed. Overdosage: If you think you have taken too much of this medicine contact a poison control center or emergency room at once. NOTE: This medicine is only for you. Do not share this medicine with others. What if I miss a dose? It is important not to miss your dose. Call your doctor or health care professional if you are unable to keep an appointment. What may interact with this medicine? This medicine may interact with the following medications: -amiodarone -amphotericin B -azathioprine -certain antiviral medicines for HIV or AIDS such as protease inhibitors (e.g., indinavir, ritonavir) and zidovudine -certain blood pressure medications such as   benazepril, captopril, enalapril, fosinopril, lisinopril, moexipril, monopril, perindopril, quinapril, ramipril, trandolapril -certain cancer medications such as anthracyclines (e.g., daunorubicin, doxorubicin), busulfan, cytarabine, paclitaxel, pentostatin, tamoxifen, trastuzumab -certain diuretics such as chlorothiazide, chlorthalidone, hydrochlorothiazide, indapamide, metolazone -certain medicines that treat or  prevent blood clots like warfarin -certain muscle relaxants such as succinylcholine -cyclosporine -etanercept -indomethacin -medicines to increase blood counts like filgrastim, pegfilgrastim, sargramostim -medicines used as general anesthesia -metronidazole -natalizumab This list may not describe all possible interactions. Give your health care provider a list of all the medicines, herbs, non-prescription drugs, or dietary supplements you use. Also tell them if you smoke, drink alcohol, or use illegal drugs. Some items may interact with your medicine. What should I watch for while using this medicine? Visit your doctor for checks on your progress. This drug may make you feel generally unwell. This is not uncommon, as chemotherapy can affect healthy cells as well as cancer cells. Report any side effects. Continue your course of treatment even though you feel ill unless your doctor tells you to stop. Drink water or other fluids as directed. Urinate often, even at night. In some cases, you may be given additional medicines to help with side effects. Follow all directions for their use. Call your doctor or health care professional for advice if you get a fever, chills or sore throat, or other symptoms of a cold or flu. Do not treat yourself. This drug decreases your body's ability to fight infections. Try to avoid being around people who are sick. This medicine may increase your risk to bruise or bleed. Call your doctor or health care professional if you notice any unusual bleeding. Be careful brushing and flossing your teeth or using a toothpick because you may get an infection or bleed more easily. If you have any dental work done, tell your dentist you are receiving this medicine. You may get drowsy or dizzy. Do not drive, use machinery, or do anything that needs mental alertness until you know how this medicine affects you. Do not become pregnant while taking this medicine or for 1 year after  stopping it. Women should inform their doctor if they wish to become pregnant or think they might be pregnant. Men should not father a child while taking this medicine and for 4 months after stopping it. There is a potential for serious side effects to an unborn child. Talk to your health care professional or pharmacist for more information. Do not breast-feed an infant while taking this medicine. This medicine may interfere with the ability to have a child. This medicine has caused ovarian failure in some women. This medicine has caused reduced sperm counts in some men. You should talk with your doctor or health care professional if you are concerned about your fertility. If you are going to have surgery, tell your doctor or health care professional that you have taken this medicine. What side effects may I notice from receiving this medicine? Side effects that you should report to your doctor or health care professional as soon as possible: -allergic reactions like skin rash, itching or hives, swelling of the face, lips, or tongue -low blood counts - this medicine may decrease the number of white blood cells, red blood cells and platelets. You may be at increased risk for infections and bleeding. -signs of infection - fever or chills, cough, sore throat, pain or difficulty passing urine -signs of decreased platelets or bleeding - bruising, pinpoint red spots on the skin, black, tarry stools, blood in the urine -signs  of decreased red blood cells - unusually weak or tired, fainting spells, lightheadedness -breathing problems -dark urine -dizziness -palpitations -swelling of the ankles, feet, hands -trouble passing urine or change in the amount of urine -weight gain -yellowing of the eyes or skin Side effects that usually do not require medical attention (report to your doctor or health care professional if they continue or are bothersome): -changes in nail or skin color -hair loss -missed  menstrual periods -mouth sores -nausea, vomiting This list may not describe all possible side effects. Call your doctor for medical advice about side effects. You may report side effects to FDA at 1-800-FDA-1088. Where should I keep my medicine? This drug is given in a hospital or clinic and will not be stored at home. NOTE: This sheet is a summary. It may not cover all possible information. If you have questions about this medicine, talk to your doctor, pharmacist, or health care provider.  2018 Elsevier/Gold Standard (2012-01-27 16:22:58)

## 2016-11-24 ENCOUNTER — Ambulatory Visit: Payer: Medicare (Managed Care)

## 2016-11-25 ENCOUNTER — Ambulatory Visit (HOSPITAL_BASED_OUTPATIENT_CLINIC_OR_DEPARTMENT_OTHER): Payer: Medicare (Managed Care)

## 2016-11-25 VITALS — BP 141/81 | HR 86 | Temp 98.0°F | Resp 16

## 2016-11-25 DIAGNOSIS — C9000 Multiple myeloma not having achieved remission: Secondary | ICD-10-CM

## 2016-11-25 DIAGNOSIS — Z5112 Encounter for antineoplastic immunotherapy: Secondary | ICD-10-CM | POA: Diagnosis not present

## 2016-11-25 MED ORDER — PROCHLORPERAZINE MALEATE 10 MG PO TABS
10.0000 mg | ORAL_TABLET | Freq: Once | ORAL | Status: AC
  Administered 2016-11-25: 10 mg via ORAL

## 2016-11-25 MED ORDER — BORTEZOMIB CHEMO SQ INJECTION 3.5 MG (2.5MG/ML)
1.3000 mg/m2 | Freq: Once | INTRAMUSCULAR | Status: AC
  Administered 2016-11-25: 1.75 mg via SUBCUTANEOUS
  Filled 2016-11-25: qty 1.75

## 2016-11-25 MED ORDER — PROCHLORPERAZINE MALEATE 10 MG PO TABS
ORAL_TABLET | ORAL | Status: AC
  Filled 2016-11-25: qty 1

## 2016-11-25 NOTE — Patient Instructions (Addendum)
[facility] Discharge Instructions for Patients Receiving Chemotherapy  Today you received the following chemotherapy agents Velcade,   To help prevent nausea and vomiting after your treatment, we encourage you to take your nausea medication    If you develop nausea and vomiting that is not controlled by your nausea medication, call the clinic.   BELOW ARE SYMPTOMS THAT SHOULD BE REPORTED IMMEDIATELY:  *FEVER GREATER THAN 100.5 F  *CHILLS WITH OR WITHOUT FEVER  NAUSEA AND VOMITING THAT IS NOT CONTROLLED WITH YOUR NAUSEA MEDICATION  *UNUSUAL SHORTNESS OF BREATH  *UNUSUAL BRUISING OR BLEEDING  TENDERNESS IN MOUTH AND THROAT WITH OR WITHOUT PRESENCE OF ULCERS  *URINARY PROBLEMS  *BOWEL PROBLEMS  UNUSUAL RASH Items with * indicate a potential emergency and should be followed up as soon as possible.  Feel free to call the clinic you have any questions or concerns. The clinic phone number is [phone].  Please show the [facility] at check-in to the Emergency Department and triage nurse.

## 2016-12-06 ENCOUNTER — Ambulatory Visit (HOSPITAL_BASED_OUTPATIENT_CLINIC_OR_DEPARTMENT_OTHER): Payer: Medicare (Managed Care)

## 2016-12-06 ENCOUNTER — Other Ambulatory Visit (HOSPITAL_BASED_OUTPATIENT_CLINIC_OR_DEPARTMENT_OTHER): Payer: Medicare Other

## 2016-12-06 ENCOUNTER — Ambulatory Visit (HOSPITAL_BASED_OUTPATIENT_CLINIC_OR_DEPARTMENT_OTHER): Payer: Medicare (Managed Care) | Admitting: Family

## 2016-12-06 VITALS — BP 139/80 | HR 87 | Temp 98.1°F | Resp 16 | Wt 103.0 lb

## 2016-12-06 DIAGNOSIS — Z5112 Encounter for antineoplastic immunotherapy: Secondary | ICD-10-CM

## 2016-12-06 DIAGNOSIS — C9000 Multiple myeloma not having achieved remission: Secondary | ICD-10-CM

## 2016-12-06 DIAGNOSIS — M545 Low back pain: Secondary | ICD-10-CM | POA: Diagnosis not present

## 2016-12-06 DIAGNOSIS — N19 Unspecified kidney failure: Secondary | ICD-10-CM

## 2016-12-06 LAB — CMP (CANCER CENTER ONLY)
ALK PHOS: 91 U/L — AB (ref 26–84)
ALT: 32 U/L (ref 10–47)
AST: 33 U/L (ref 11–38)
Albumin: 3.6 g/dL (ref 3.3–5.5)
BUN, Bld: 16 mg/dL (ref 7–22)
CALCIUM: 8.2 mg/dL (ref 8.0–10.3)
CO2: 28 mEq/L (ref 18–33)
Chloride: 103 mEq/L (ref 98–108)
Creat: 1.3 mg/dl — ABNORMAL HIGH (ref 0.6–1.2)
Glucose, Bld: 100 mg/dL (ref 73–118)
POTASSIUM: 3.7 meq/L (ref 3.3–4.7)
Sodium: 140 mEq/L (ref 128–145)
TOTAL PROTEIN: 6.2 g/dL — AB (ref 6.4–8.1)
Total Bilirubin: 0.7 mg/dl (ref 0.20–1.60)

## 2016-12-06 LAB — CBC WITH DIFFERENTIAL (CANCER CENTER ONLY)
BASO#: 0 10*3/uL (ref 0.0–0.2)
BASO%: 0.2 % (ref 0.0–2.0)
EOS%: 0.4 % (ref 0.0–7.0)
Eosinophils Absolute: 0 10*3/uL (ref 0.0–0.5)
HEMATOCRIT: 33.4 % — AB (ref 34.8–46.6)
HGB: 11.1 g/dL — ABNORMAL LOW (ref 11.6–15.9)
LYMPH#: 0.6 10*3/uL — AB (ref 0.9–3.3)
LYMPH%: 11.5 % — ABNORMAL LOW (ref 14.0–48.0)
MCH: 31.3 pg (ref 26.0–34.0)
MCHC: 33.2 g/dL (ref 32.0–36.0)
MCV: 94 fL (ref 81–101)
MONO#: 0.5 10*3/uL (ref 0.1–0.9)
MONO%: 8.6 % (ref 0.0–13.0)
NEUT%: 79.3 % (ref 39.6–80.0)
NEUTROS ABS: 4.3 10*3/uL (ref 1.5–6.5)
Platelets: 321 10*3/uL (ref 145–400)
RBC: 3.55 10*6/uL — AB (ref 3.70–5.32)
RDW: 16.1 % — ABNORMAL HIGH (ref 11.1–15.7)
WBC: 5.5 10*3/uL (ref 3.9–10.0)

## 2016-12-06 MED ORDER — BORTEZOMIB CHEMO SQ INJECTION 3.5 MG (2.5MG/ML)
1.3000 mg/m2 | Freq: Once | INTRAMUSCULAR | Status: AC
  Administered 2016-12-06: 1.75 mg via SUBCUTANEOUS
  Filled 2016-12-06: qty 1.75

## 2016-12-06 MED ORDER — PALONOSETRON HCL INJECTION 0.25 MG/5ML
INTRAVENOUS | Status: AC
  Filled 2016-12-06: qty 5

## 2016-12-06 MED ORDER — SODIUM CHLORIDE 0.9 % IV SOLN
300.0000 mg/m2 | Freq: Once | INTRAVENOUS | Status: AC
  Administered 2016-12-06: 420 mg via INTRAVENOUS
  Filled 2016-12-06: qty 21

## 2016-12-06 MED ORDER — DENOSUMAB 120 MG/1.7ML ~~LOC~~ SOLN
120.0000 mg | Freq: Once | SUBCUTANEOUS | Status: AC
  Administered 2016-12-06: 120 mg via SUBCUTANEOUS

## 2016-12-06 MED ORDER — PALONOSETRON HCL INJECTION 0.25 MG/5ML
0.2500 mg | Freq: Once | INTRAVENOUS | Status: AC
  Administered 2016-12-06: 0.25 mg via INTRAVENOUS

## 2016-12-06 NOTE — Progress Notes (Signed)
Hematology and Oncology Follow Up Visit  Jeanne Martinez [id number] 1951-01-04 66 y.o. 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 She is doing quite well and since starting the daily decadron, has had a boost in energy and appetite. Her weight is up another 5 lbs.  She has tolerated treatment nicely so far and will receive cycle 4 today as planned.  Last month her M-spike was not detected, IgA level 191 mg/dL and kappa light chain 5.8 mg/L. Her creatinine today is 1.3 and BUN 16. I will forward her labs and note to nephrology.  She denies fever, chills, n/v, cough, rash, dizziness, SOB, chest pain, palpitations, abdominal pain or changes in bowel or bladder habits.  She has some puffiness on the tops of her feet. No redness or edema. Pedal pulses are +1.  She is maintaining a good appetite and is staying well hydrated.  She states that her lower back pain is much better and has not been bothering her. She is ambulating without assistance. No falls or syncopal episodes.  She is excited to be going to visit family in [place] in a few weeks (during her off week from treatment).   ECOG Performance Status: 1 - Symptomatic but completely ambulatory  Medications:  Allergies as of 12/06/2016      Reactions   No Known Allergies       Medication List       Accurate as of 12/06/16 10:10 PM. Always use your most recent med list.          allopurinol 100 MG tablet Commonly known as:  ZYLOPRIM Take 100 mg by mouth daily.   dexamethasone 4 MG tablet Commonly known as:  DECADRON Take 5 pills once a week with food.   famciclovir 250 MG tablet Commonly known as:  FAMVIR Take 1 tablet (250 mg total) by mouth daily.   oxyCODONE-acetaminophen 5-325 MG tablet Commonly known  as:  PERCOCET/ROXICET TAKE 1 TABLET BY MOUTH EVERY 4 HOURS AS NEEDED FOR MODERATE PAIN MAX DAILY AMOUNT 6 TABLETS       Allergies:  Allergies  Allergen Reactions  . No Known Allergies     Past Medical History, Surgical history, Social history, and Family History were reviewed and updated.  Review of Systems: All other 10 point review of systems is negative.   Physical Exam:  weight is 103 lb (46.7 kg). Her oral temperature is 98.1 F (36.7 C). Her blood pressure is 139/80 and her pulse is 87. Her respiration is 16 and oxygen saturation is 100%.   Wt Readings from Last 3 Encounters:  12/06/16 103 lb (46.7 kg)  11/15/16 98 lb (44.5 kg)  11/02/16 98 lb 4 oz (44.6 kg)    Ocular: Sclerae unicteric, pupils equal, round and reactive to light Ear-nose-throat: Oropharynx clear, dentition fair Lymphatic: No cervical, supraclavicular or axillary adenopathy Lungs no rales or rhonchi, good excursion bilaterally Heart regular rate and rhythm, no murmur appreciated Abd soft, nontender, positive bowel sounds, no liver or spleen tip palpated on exam, no fluid wave  MSK no focal spinal tenderness, no joint edema Neuro: non-focal, well-oriented, appropriate affect Breasts: Deferred   Lab Results  Component Value Date   WBC 5.5 12/06/2016   HGB 11.1 (L) 12/06/2016   HCT 33.4 (L)  12/06/2016   MCV 94 12/06/2016   PLT 321 12/06/2016   Lab Results  Component Value Date   FERRITIN 1,412 (H) 09/30/2016   IRON 140 09/30/2016   TIBC 240 09/30/2016   UIBC 100 (L) 09/30/2016   IRONPCTSAT 59 (H) 09/30/2016   Lab Results  Component Value Date   RBC 3.55 (L) 12/06/2016   Lab Results  Component Value Date   KAPLAMBRATIO 2.32 (H) 11/15/2016   Lab Results  Component Value Date   IGGSERUM 336 (L) 11/15/2016   IGMSERUM 7 (L) 11/15/2016   Lab Results  Component Value Date   MSPIKE Not Observed 11/15/2016     Chemistry      Component Value Date/Time   NA 140 12/06/2016 1118   K 3.7  12/06/2016 1118   CL 103 12/06/2016 1118   CO2 28 12/06/2016 1118   BUN 16 12/06/2016 1118   CREATININE 1.3 (H) 12/06/2016 1118      Component Value Date/Time   CALCIUM 8.2 12/06/2016 1118   ALKPHOS 91 (H) 12/06/2016 1118   AST 33 12/06/2016 1118   ALT 32 12/06/2016 1118   BILITOT 0.70 12/06/2016 1118      Impression and Plan: Jeanne Martinez is a very pleasant 66 yo caucasian female with IgA Kappa Myeloma with renal failure due to light chain deposition. She has had a wonderful response to treatment so far and her counts have consistently to improve. We will continue on her same regimen for now and proceed with cycle 4 as planned.  At some point we will look at doing a bone marrow biopsy.  She has her current treatment and appointment schedule and will contact our office with any questions or concerns.  She has a wonderful family that is very supportive. We can certainly see her sooner if need be.   Eliezer Bottom, NP 9/11/201810:10 PM

## 2016-12-06 NOTE — Patient Instructions (Signed)
[place] [facility] Discharge Instructions for Patients Receiving Chemotherapy  Today you received the following chemotherapy agents Cytoxan, Velcade  To help prevent nausea and vomiting after your treatment, we encourage you to take your nausea medication    If you develop nausea and vomiting that is not controlled by your nausea medication, call the clinic.   BELOW ARE SYMPTOMS THAT SHOULD BE REPORTED IMMEDIATELY:  *FEVER GREATER THAN 100.5 F  *CHILLS WITH OR WITHOUT FEVER  NAUSEA AND VOMITING THAT IS NOT CONTROLLED WITH YOUR NAUSEA MEDICATION  *UNUSUAL SHORTNESS OF BREATH  *UNUSUAL BRUISING OR BLEEDING  TENDERNESS IN MOUTH AND THROAT WITH OR WITHOUT PRESENCE OF ULCERS  *URINARY PROBLEMS  *BOWEL PROBLEMS  UNUSUAL RASH Items with * indicate a potential emergency and should be followed up as soon as possible.  Feel free to call the clinic you have any questions or concerns. The clinic phone number is [phone].  Please show the [facility] at check-in to the Emergency Department and triage nurse.Denosumab injection What is this medicine? DENOSUMAB (den oh sue mab) slows bone breakdown. Prolia is used to treat osteoporosis in women after menopause and in men. Delton See is used to treat a high calcium level due to cancer and to prevent bone fractures and other bone problems caused by multiple myeloma or cancer bone metastases. Delton See is also used to treat giant cell tumor of the bone. This medicine may be used for other purposes; ask your health care provider or pharmacist if you have questions. COMMON BRAND NAME(S): Prolia, XGEVA What should I tell my health care provider before I take this medicine? They need to know if you have any of these conditions: -dental disease -having surgery or tooth extraction -infection -kidney disease -low levels of calcium or Vitamin D in the blood -malnutrition -on hemodialysis -skin conditions or sensitivity -thyroid or  parathyroid disease -an unusual reaction to denosumab, other medicines, foods, dyes, or preservatives -pregnant or trying to get pregnant -breast-feeding How should I use this medicine? This medicine is for injection under the skin. It is given by a health care professional in a hospital or clinic setting. If you are getting Prolia, a special MedGuide will be given to you by the pharmacist with each prescription and refill. Be sure to read this information carefully each time. For Prolia, talk to your pediatrician regarding the use of this medicine in children. Special care may be needed. For Delton See, talk to your pediatrician regarding the use of this medicine in children. While this drug may be prescribed for children as young as 13 years for selected conditions, precautions do apply. Overdosage: If you think you have taken too much of this medicine contact a poison control center or emergency room at once. NOTE: This medicine is only for you. Do not share this medicine with others. What if I miss a dose? It is important not to miss your dose. Call your doctor or health care professional if you are unable to keep an appointment. What may interact with this medicine? Do not take this medicine with any of the following medications: -other medicines containing denosumab This medicine may also interact with the following medications: -medicines that lower your chance of fighting infection -steroid medicines like prednisone or cortisone This list may not describe all possible interactions. Give your health care provider a list of all the medicines, herbs, non-prescription drugs, or dietary supplements you use. Also tell them if you smoke, drink alcohol, or use illegal drugs. Some items  may interact with your medicine. What should I watch for while using this medicine? Visit your doctor or health care professional for regular checks on your progress. Your doctor or health care professional may order  blood tests and other tests to see how you are doing. Call your doctor or health care professional for advice if you get a fever, chills or sore throat, or other symptoms of a cold or flu. Do not treat yourself. This drug may decrease your body's ability to fight infection. Try to avoid being around people who are sick. You should make sure you get enough calcium and vitamin D while you are taking this medicine, unless your doctor tells you not to. Discuss the foods you eat and the vitamins you take with your health care professional. See your dentist regularly. Brush and floss your teeth as directed. Before you have any dental work done, tell your dentist you are receiving this medicine. Do not become pregnant while taking this medicine or for 5 months after stopping it. Talk with your doctor or health care professional about your birth control options while taking this medicine. Women should inform their doctor if they wish to become pregnant or think they might be pregnant. There is a potential for serious side effects to an unborn child. Talk to your health care professional or pharmacist for more information. What side effects may I notice from receiving this medicine? Side effects that you should report to your doctor or health care professional as soon as possible: -allergic reactions like skin rash, itching or hives, swelling of the face, lips, or tongue -bone pain -breathing problems -dizziness -jaw pain, especially after dental work -redness, blistering, peeling of the skin -signs and symptoms of infection like fever or chills; cough; sore throat; pain or trouble passing urine -signs of low calcium like fast heartbeat, muscle cramps or muscle pain; pain, tingling, numbness in the hands or feet; seizures -unusual bleeding or bruising -unusually weak or tired Side effects that usually do not require medical attention (report to your doctor or health care professional if they continue or are  bothersome): -constipation -diarrhea -headache -joint pain -loss of appetite -muscle pain -runny nose -tiredness -upset stomach This list may not describe all possible side effects. Call your doctor for medical advice about side effects. You may report side effects to FDA at 1-800-FDA-1088. Where should I keep my medicine? This medicine is only given in a clinic, doctor's office, or other health care setting and will not be stored at home. NOTE: This sheet is a summary. It may not cover all possible information. If you have questions about this medicine, talk to your doctor, pharmacist, or health care provider.  2018 Elsevier/Gold Standard (2016-04-05 19:17:21)

## 2016-12-07 LAB — KAPPA/LAMBDA LIGHT CHAINS
IG KAPPA FREE LIGHT CHAIN: 4.9 mg/L (ref 3.3–19.4)
IG LAMBDA FREE LIGHT CHAIN: 2.2 mg/L — AB (ref 5.7–26.3)
KAPPA/LAMBDA FLC RATIO: 2.23 — AB (ref 0.26–1.65)

## 2016-12-07 LAB — IGG, IGA, IGM
IGG (IMMUNOGLOBIN G), SERUM: 278 mg/dL — AB (ref 700–1600)
IgA, Qn, Serum: 63 mg/dL — ABNORMAL LOW (ref 87–352)
IgM, Qn, Serum: <5 mg/dL — ABNORMAL LOW (ref 26–217)

## 2016-12-08 ENCOUNTER — Ambulatory Visit (HOSPITAL_BASED_OUTPATIENT_CLINIC_OR_DEPARTMENT_OTHER): Payer: Medicare (Managed Care)

## 2016-12-08 ENCOUNTER — Other Ambulatory Visit: Payer: Self-pay | Admitting: Hematology & Oncology

## 2016-12-08 ENCOUNTER — Ambulatory Visit: Payer: Medicare (Managed Care)

## 2016-12-08 VITALS — BP 134/82 | HR 86 | Temp 98.1°F | Resp 16

## 2016-12-08 DIAGNOSIS — Z5112 Encounter for antineoplastic immunotherapy: Secondary | ICD-10-CM | POA: Diagnosis not present

## 2016-12-08 DIAGNOSIS — C9000 Multiple myeloma not having achieved remission: Secondary | ICD-10-CM

## 2016-12-08 MED ORDER — PROCHLORPERAZINE MALEATE 10 MG PO TABS
10.0000 mg | ORAL_TABLET | Freq: Once | ORAL | Status: AC
  Administered 2016-12-08: 10 mg via ORAL

## 2016-12-08 MED ORDER — BORTEZOMIB CHEMO SQ INJECTION 3.5 MG (2.5MG/ML)
1.3000 mg/m2 | Freq: Once | INTRAMUSCULAR | Status: AC
  Administered 2016-12-08: 1.75 mg via SUBCUTANEOUS
  Filled 2016-12-08: qty 1.75

## 2016-12-08 MED ORDER — PROCHLORPERAZINE MALEATE 10 MG PO TABS
ORAL_TABLET | ORAL | Status: AC
Start: 2016-12-08 — End: ?
  Filled 2016-12-08: qty 1

## 2016-12-08 NOTE — Patient Instructions (Signed)

## 2016-12-09 ENCOUNTER — Ambulatory Visit: Payer: Medicare (Managed Care)

## 2016-12-09 LAB — PROTEIN ELECTROPHORESIS, SERUM, WITH REFLEX
A/G RATIO SPE: 1.6 (ref 0.7–1.7)
ALPHA 1: 0.3 g/dL (ref 0.0–0.4)
Albumin: 3.6 g/dL (ref 2.9–4.4)
Alpha 2: 0.7 g/dL (ref 0.4–1.0)
Beta: 1 g/dL (ref 0.7–1.3)
Gamma Globulin: 0.3 g/dL — ABNORMAL LOW (ref 0.4–1.8)
Globulin, Total: 2.3 g/dL (ref 2.2–3.9)
INTERPRETATION(SEE BELOW): 0
M-SPIKE, %: 0.2 g/dL — AB
Total Protein: 5.9 g/dL — ABNORMAL LOW (ref 6.0–8.5)

## 2016-12-12 ENCOUNTER — Other Ambulatory Visit: Payer: Self-pay

## 2016-12-12 DIAGNOSIS — C9000 Multiple myeloma not having achieved remission: Secondary | ICD-10-CM

## 2016-12-13 ENCOUNTER — Other Ambulatory Visit (HOSPITAL_BASED_OUTPATIENT_CLINIC_OR_DEPARTMENT_OTHER): Payer: Medicare (Managed Care)

## 2016-12-13 ENCOUNTER — Ambulatory Visit (HOSPITAL_BASED_OUTPATIENT_CLINIC_OR_DEPARTMENT_OTHER): Payer: Medicare (Managed Care)

## 2016-12-13 VITALS — BP 134/84 | HR 80 | Temp 98.3°F | Resp 16 | Wt 103.0 lb

## 2016-12-13 DIAGNOSIS — Z5111 Encounter for antineoplastic chemotherapy: Secondary | ICD-10-CM | POA: Diagnosis not present

## 2016-12-13 DIAGNOSIS — C9000 Multiple myeloma not having achieved remission: Secondary | ICD-10-CM

## 2016-12-13 DIAGNOSIS — Z5112 Encounter for antineoplastic immunotherapy: Secondary | ICD-10-CM

## 2016-12-13 LAB — CBC WITH DIFFERENTIAL (CANCER CENTER ONLY)
BASO#: 0 10*3/uL (ref 0.0–0.2)
BASO%: 0.3 % (ref 0.0–2.0)
EOS%: 1.1 % (ref 0.0–7.0)
Eosinophils Absolute: 0 10*3/uL (ref 0.0–0.5)
HEMATOCRIT: 35.2 % (ref 34.8–46.6)
HEMOGLOBIN: 11.6 g/dL (ref 11.6–15.9)
LYMPH#: 0.8 10*3/uL — AB (ref 0.9–3.3)
LYMPH%: 21.5 % (ref 14.0–48.0)
MCH: 30.6 pg (ref 26.0–34.0)
MCHC: 33 g/dL (ref 32.0–36.0)
MCV: 93 fL (ref 81–101)
MONO#: 0.5 10*3/uL (ref 0.1–0.9)
MONO%: 14.4 % — ABNORMAL HIGH (ref 0.0–13.0)
NEUT#: 2.3 10*3/uL (ref 1.5–6.5)
NEUT%: 62.7 % (ref 39.6–80.0)
Platelets: 178 10*3/uL (ref 145–400)
RBC: 3.79 10*6/uL (ref 3.70–5.32)
RDW: 15.7 % (ref 11.1–15.7)
WBC: 3.7 10*3/uL — ABNORMAL LOW (ref 3.9–10.0)

## 2016-12-13 LAB — CMP (CANCER CENTER ONLY)
ALBUMIN: 3.6 g/dL (ref 3.3–5.5)
ALK PHOS: 86 U/L — AB (ref 26–84)
ALT: 29 U/L (ref 10–47)
AST: 30 U/L (ref 11–38)
BUN, Bld: 21 mg/dL (ref 7–22)
CALCIUM: 9.8 mg/dL (ref 8.0–10.3)
CO2: 28 meq/L (ref 18–33)
Chloride: 103 mEq/L (ref 98–108)
Creat: 1.3 mg/dl — ABNORMAL HIGH (ref 0.6–1.2)
Glucose, Bld: 87 mg/dL (ref 73–118)
POTASSIUM: 4.2 meq/L (ref 3.3–4.7)
Sodium: 144 mEq/L (ref 128–145)
Total Bilirubin: 0.6 mg/dl (ref 0.20–1.60)
Total Protein: 6.6 g/dL (ref 6.4–8.1)

## 2016-12-13 MED ORDER — PALONOSETRON HCL INJECTION 0.25 MG/5ML
0.2500 mg | Freq: Once | INTRAVENOUS | Status: AC
  Administered 2016-12-13: 0.25 mg via INTRAVENOUS

## 2016-12-13 MED ORDER — BORTEZOMIB CHEMO SQ INJECTION 3.5 MG (2.5MG/ML)
1.3000 mg/m2 | Freq: Once | INTRAMUSCULAR | Status: AC
  Administered 2016-12-13: 1.75 mg via SUBCUTANEOUS
  Filled 2016-12-13: qty 0.7

## 2016-12-13 MED ORDER — PALONOSETRON HCL INJECTION 0.25 MG/5ML
INTRAVENOUS | Status: AC
  Filled 2016-12-13: qty 5

## 2016-12-13 MED ORDER — SODIUM CHLORIDE 0.9 % IV SOLN
300.0000 mg/m2 | Freq: Once | INTRAVENOUS | Status: AC
  Administered 2016-12-13: 420 mg via INTRAVENOUS
  Filled 2016-12-13: qty 21

## 2016-12-13 NOTE — Patient Instructions (Signed)
Bortezomib injection What is this medicine? BORTEZOMIB (bor TEZ oh mib) is a medicine that targets proteins in cancer cells and stops the cancer cells from growing. It is used to treat multiple myeloma and mantle-cell lymphoma. This medicine may be used for other purposes; ask your health care provider or pharmacist if you have questions. COMMON BRAND NAME(S): Velcade What should I tell my health care provider before I take this medicine? They need to know if you have any of these conditions: -diabetes -heart disease -irregular heartbeat -liver disease -on hemodialysis -low blood counts, like low white blood cells, platelets, or hemoglobin -peripheral neuropathy -taking medicine for blood pressure -an unusual or allergic reaction to bortezomib, mannitol, boron, other medicines, foods, dyes, or preservatives -pregnant or trying to get pregnant -breast-feeding How should I use this medicine? This medicine is for injection into a vein or for injection under the skin. It is given by a health care professional in a hospital or clinic setting. Talk to your pediatrician regarding the use of this medicine in children. Special care may be needed. Overdosage: If you think you have taken too much of this medicine contact a poison control center or emergency room at once. NOTE: This medicine is only for you. Do not share this medicine with others. What if I miss a dose? It is important not to miss your dose. Call your doctor or health care professional if you are unable to keep an appointment. What may interact with this medicine? This medicine may interact with the following medications: -ketoconazole -rifampin -ritonavir -St. John's Wort This list may not describe all possible interactions. Give your health care provider a list of all the medicines, herbs, non-prescription drugs, or dietary supplements you use. Also tell them if you smoke, drink alcohol, or use illegal drugs. Some items may  interact with your medicine. What should I watch for while using this medicine? You may get drowsy or dizzy. Do not drive, use machinery, or do anything that needs mental alertness until you know how this medicine affects you. Do not stand or sit up quickly, especially if you are an older patient. This reduces the risk of dizzy or fainting spells. In some cases, you may be given additional medicines to help with side effects. Follow all directions for their use. Call your doctor or health care professional for advice if you get a fever, chills or sore throat, or other symptoms of a cold or flu. Do not treat yourself. This drug decreases your body's ability to fight infections. Try to avoid being around people who are sick. This medicine may increase your risk to bruise or bleed. Call your doctor or health care professional if you notice any unusual bleeding. You may need blood work done while you are taking this medicine. In some patients, this medicine may cause a serious brain infection that may cause death. If you have any problems seeing, thinking, speaking, walking, or standing, tell your doctor right away. If you cannot reach your doctor, urgently seek other source of medical care. Check with your doctor or health care professional if you get an attack of severe diarrhea, nausea and vomiting, or if you sweat a lot. The loss of too much body fluid can make it dangerous for you to take this medicine. Do not become pregnant while taking this medicine or for at least 2 months after stopping it. Women should inform their doctor if they wish to become pregnant or think they might be pregnant. Men should not  father a child while taking this medicine and for at least 2 months after stopping it. There is a potential for serious side effects to an unborn child. Talk to your health care professional or pharmacist for more information. Do not breast-feed an infant while taking this medicine or for 2 months after  stopping it. This medicine may interfere with the ability to have a child. You should talk with your doctor or health care professional if you are concerned about your fertility. What side effects may I notice from receiving this medicine? Side effects that you should report to your doctor or health care professional as soon as possible: -allergic reactions like skin rash, itching or hives, swelling of the face, lips, or tongue -breathing problems -changes in hearing -changes in vision -fast, irregular heartbeat -feeling faint or lightheaded, falls -pain, tingling, numbness in the hands or feet -right upper belly pain -seizures -swelling of the ankles, feet, hands -unusual bleeding or bruising -unusually weak or tired -vomiting -yellowing of the eyes or skin Side effects that usually do not require medical attention (report to your doctor or health care professional if they continue or are bothersome): -changes in emotions or moods -constipation -diarrhea -loss of appetite -headache -irritation at site where injected -nausea This list may not describe all possible side effects. Call your doctor for medical advice about side effects. You may report side effects to FDA at 1-800-FDA-1088. Where should I keep my medicine? This drug is given in a hospital or clinic and will not be stored at home. NOTE: This sheet is a summary. It may not cover all possible information. If you have questions about this medicine, talk to your doctor, pharmacist, or health care provider.  2018 Elsevier/Gold Standard (2016-02-11 15:53:51) Cyclophosphamide injection What is this medicine? CYCLOPHOSPHAMIDE (sye kloe FOSS fa mide) is a chemotherapy drug. It slows the growth of cancer cells. This medicine is used to treat many types of cancer like lymphoma, myeloma, leukemia, breast cancer, and ovarian cancer, to name a few. This medicine may be used for other purposes; ask your health care provider or  pharmacist if you have questions. COMMON BRAND NAME(S): Cytoxan, Neosar What should I tell my health care provider before I take this medicine? They need to know if you have any of these conditions: -blood disorders -history of other chemotherapy -infection -kidney disease -liver disease -recent or ongoing radiation therapy -tumors in the bone marrow -an unusual or allergic reaction to cyclophosphamide, other chemotherapy, other medicines, foods, dyes, or preservatives -pregnant or trying to get pregnant -breast-feeding How should I use this medicine? This drug is usually given as an injection into a vein or muscle or by infusion into a vein. It is administered in a hospital or clinic by a specially trained health care professional. Talk to your pediatrician regarding the use of this medicine in children. Special care may be needed. Overdosage: If you think you have taken too much of this medicine contact a poison control center or emergency room at once. NOTE: This medicine is only for you. Do not share this medicine with others. What if I miss a dose? It is important not to miss your dose. Call your doctor or health care professional if you are unable to keep an appointment. What may interact with this medicine? This medicine may interact with the following medications: -amiodarone -amphotericin B -azathioprine -certain antiviral medicines for HIV or AIDS such as protease inhibitors (e.g., indinavir, ritonavir) and zidovudine -certain blood pressure medications such as   benazepril, captopril, enalapril, fosinopril, lisinopril, moexipril, monopril, perindopril, quinapril, ramipril, trandolapril -certain cancer medications such as anthracyclines (e.g., daunorubicin, doxorubicin), busulfan, cytarabine, paclitaxel, pentostatin, tamoxifen, trastuzumab -certain diuretics such as chlorothiazide, chlorthalidone, hydrochlorothiazide, indapamide, metolazone -certain medicines that treat or  prevent blood clots like warfarin -certain muscle relaxants such as succinylcholine -cyclosporine -etanercept -indomethacin -medicines to increase blood counts like filgrastim, pegfilgrastim, sargramostim -medicines used as general anesthesia -metronidazole -natalizumab This list may not describe all possible interactions. Give your health care provider a list of all the medicines, herbs, non-prescription drugs, or dietary supplements you use. Also tell them if you smoke, drink alcohol, or use illegal drugs. Some items may interact with your medicine. What should I watch for while using this medicine? Visit your doctor for checks on your progress. This drug may make you feel generally unwell. This is not uncommon, as chemotherapy can affect healthy cells as well as cancer cells. Report any side effects. Continue your course of treatment even though you feel ill unless your doctor tells you to stop. Drink water or other fluids as directed. Urinate often, even at night. In some cases, you may be given additional medicines to help with side effects. Follow all directions for their use. Call your doctor or health care professional for advice if you get a fever, chills or sore throat, or other symptoms of a cold or flu. Do not treat yourself. This drug decreases your body's ability to fight infections. Try to avoid being around people who are sick. This medicine may increase your risk to bruise or bleed. Call your doctor or health care professional if you notice any unusual bleeding. Be careful brushing and flossing your teeth or using a toothpick because you may get an infection or bleed more easily. If you have any dental work done, tell your dentist you are receiving this medicine. You may get drowsy or dizzy. Do not drive, use machinery, or do anything that needs mental alertness until you know how this medicine affects you. Do not become pregnant while taking this medicine or for 1 year after  stopping it. Women should inform their doctor if they wish to become pregnant or think they might be pregnant. Men should not father a child while taking this medicine and for 4 months after stopping it. There is a potential for serious side effects to an unborn child. Talk to your health care professional or pharmacist for more information. Do not breast-feed an infant while taking this medicine. This medicine may interfere with the ability to have a child. This medicine has caused ovarian failure in some women. This medicine has caused reduced sperm counts in some men. You should talk with your doctor or health care professional if you are concerned about your fertility. If you are going to have surgery, tell your doctor or health care professional that you have taken this medicine. What side effects may I notice from receiving this medicine? Side effects that you should report to your doctor or health care professional as soon as possible: -allergic reactions like skin rash, itching or hives, swelling of the face, lips, or tongue -low blood counts - this medicine may decrease the number of white blood cells, red blood cells and platelets. You may be at increased risk for infections and bleeding. -signs of infection - fever or chills, cough, sore throat, pain or difficulty passing urine -signs of decreased platelets or bleeding - bruising, pinpoint red spots on the skin, black, tarry stools, blood in the urine -signs  of decreased red blood cells - unusually weak or tired, fainting spells, lightheadedness -breathing problems -dark urine -dizziness -palpitations -swelling of the ankles, feet, hands -trouble passing urine or change in the amount of urine -weight gain -yellowing of the eyes or skin Side effects that usually do not require medical attention (report to your doctor or health care professional if they continue or are bothersome): -changes in nail or skin color -hair loss -missed  menstrual periods -mouth sores -nausea, vomiting This list may not describe all possible side effects. Call your doctor for medical advice about side effects. You may report side effects to FDA at 1-800-FDA-1088. Where should I keep my medicine? This drug is given in a hospital or clinic and will not be stored at home. NOTE: This sheet is a summary. It may not cover all possible information. If you have questions about this medicine, talk to your doctor, pharmacist, or health care provider.  2018 Elsevier/Gold Standard (2012-01-27 16:22:58)

## 2016-12-15 ENCOUNTER — Ambulatory Visit: Payer: Medicare (Managed Care)

## 2016-12-16 ENCOUNTER — Ambulatory Visit: Payer: Medicare (Managed Care)

## 2017-01-03 ENCOUNTER — Encounter: Payer: Self-pay | Admitting: Neurology

## 2017-01-03 ENCOUNTER — Other Ambulatory Visit (HOSPITAL_BASED_OUTPATIENT_CLINIC_OR_DEPARTMENT_OTHER): Payer: 59

## 2017-01-03 ENCOUNTER — Other Ambulatory Visit: Payer: Medicare Other

## 2017-01-03 ENCOUNTER — Ambulatory Visit (HOSPITAL_BASED_OUTPATIENT_CLINIC_OR_DEPARTMENT_OTHER): Payer: 59

## 2017-01-03 ENCOUNTER — Ambulatory Visit (HOSPITAL_BASED_OUTPATIENT_CLINIC_OR_DEPARTMENT_OTHER): Payer: 59 | Admitting: Hematology & Oncology

## 2017-01-03 VITALS — BP 137/70 | HR 86 | Temp 97.6°F | Resp 16 | Wt 100.0 lb

## 2017-01-03 DIAGNOSIS — N19 Unspecified kidney failure: Secondary | ICD-10-CM | POA: Diagnosis not present

## 2017-01-03 DIAGNOSIS — C9000 Multiple myeloma not having achieved remission: Secondary | ICD-10-CM

## 2017-01-03 DIAGNOSIS — Z5111 Encounter for antineoplastic chemotherapy: Secondary | ICD-10-CM | POA: Diagnosis not present

## 2017-01-03 DIAGNOSIS — Z5112 Encounter for antineoplastic immunotherapy: Secondary | ICD-10-CM

## 2017-01-03 LAB — CBC WITH DIFFERENTIAL (CANCER CENTER ONLY)
BASO#: 0 10*3/uL (ref 0.0–0.2)
BASO%: 0.2 % (ref 0.0–2.0)
EOS ABS: 0 10*3/uL (ref 0.0–0.5)
EOS%: 0.5 % (ref 0.0–7.0)
HEMATOCRIT: 32.8 % — AB (ref 34.8–46.6)
HEMOGLOBIN: 10.8 g/dL — AB (ref 11.6–15.9)
LYMPH#: 0.9 10*3/uL (ref 0.9–3.3)
LYMPH%: 14.5 % (ref 14.0–48.0)
MCH: 30.9 pg (ref 26.0–34.0)
MCHC: 32.9 g/dL (ref 32.0–36.0)
MCV: 94 fL (ref 81–101)
MONO#: 0.4 10*3/uL (ref 0.1–0.9)
MONO%: 6.5 % (ref 0.0–13.0)
NEUT#: 4.6 10*3/uL (ref 1.5–6.5)
NEUT%: 78.3 % (ref 39.6–80.0)
PLATELETS: 237 10*3/uL (ref 145–400)
RBC: 3.5 10*6/uL — AB (ref 3.70–5.32)
RDW: 15.5 % (ref 11.1–15.7)
WBC: 5.9 10*3/uL (ref 3.9–10.0)

## 2017-01-03 LAB — CMP (CANCER CENTER ONLY)
ALBUMIN: 3.8 g/dL (ref 3.3–5.5)
ALK PHOS: 59 U/L (ref 26–84)
ALT(SGPT): 20 U/L (ref 10–47)
AST: 26 U/L (ref 11–38)
BUN, Bld: 18 mg/dL (ref 7–22)
CALCIUM: 8.3 mg/dL (ref 8.0–10.3)
CHLORIDE: 105 meq/L (ref 98–108)
CO2: 28 mEq/L (ref 18–33)
Creat: 0.7 mg/dl (ref 0.6–1.2)
Glucose, Bld: 96 mg/dL (ref 73–118)
Sodium: 143 mEq/L (ref 128–145)
TOTAL PROTEIN: 6.4 g/dL (ref 6.4–8.1)
Total Bilirubin: 0.6 mg/dl (ref 0.20–1.60)

## 2017-01-03 MED ORDER — SODIUM CHLORIDE 0.9 % IV SOLN
Freq: Once | INTRAVENOUS | Status: AC
  Administered 2017-01-03: 13:00:00 via INTRAVENOUS

## 2017-01-03 MED ORDER — BORTEZOMIB CHEMO SQ INJECTION 3.5 MG (2.5MG/ML)
1.3000 mg/m2 | Freq: Once | INTRAMUSCULAR | Status: AC
  Administered 2017-01-03: 1.75 mg via SUBCUTANEOUS
  Filled 2017-01-03: qty 1.75

## 2017-01-03 MED ORDER — PALONOSETRON HCL INJECTION 0.25 MG/5ML
0.2500 mg | Freq: Once | INTRAVENOUS | Status: AC
  Administered 2017-01-03: 0.25 mg via INTRAVENOUS

## 2017-01-03 MED ORDER — PALONOSETRON HCL INJECTION 0.25 MG/5ML
INTRAVENOUS | Status: AC
  Filled 2017-01-03: qty 5

## 2017-01-03 MED ORDER — SODIUM CHLORIDE 0.9 % IV SOLN
300.0000 mg/m2 | Freq: Once | INTRAVENOUS | Status: AC
  Administered 2017-01-03: 420 mg via INTRAVENOUS
  Filled 2017-01-03: qty 21

## 2017-01-03 MED ORDER — POTASSIUM CHLORIDE CRYS ER 20 MEQ PO TBCR: EXTENDED_RELEASE_TABLET | ORAL | 2 refills | Status: DC

## 2017-01-03 NOTE — Progress Notes (Addendum)
Hematology and Oncology Follow Up Visit  Raylan Troiani [id number] [birth year] 66 y.o. 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 Mazor is here today with her family for follow-up. She is doing pretty nicely. She and her husband actually went back up to [facility] for a couple of weeks. They had a good time up there.  She has responded incredibly well to treatment. We first saw her, her creatinine was 2.2. She basically came in a wheelchair. Her alkaline phosphatase was 474.  Her M spike now is 0.2 g/dL. We first saw her, her M spike was 2 g/dL. Her IgA level is 63 mg/dL. Her Kappa light chain was 0.5 mg/dL.  Again she has done incredibly well.  She has tolerated treatment nicely. She's had no nausea or vomiting.  She's had no headache. She's had no mouth sores. She's had no diarrhea. There's been no hematuria. She's had no leg swelling.  Overall, her performance status is ECOG 1.   Medications:  Allergies as of 01/03/2017      Reactions   No Known Allergies       Medication List       Accurate as of 01/03/17 11:44 AM. Always use your most recent med list.          allopurinol 100 MG tablet Commonly known as:  ZYLOPRIM Take 100 mg by mouth daily.   dexamethasone 4 MG tablet Commonly known as:  DECADRON Take 5 pills once a week with food.   famciclovir 250 MG tablet Commonly known as:  FAMVIR Take 1 tablet (250 mg total) by mouth daily.   oxyCODONE-acetaminophen 5-325 MG tablet Commonly known as:  PERCOCET/ROXICET TAKE 1 TABLET BY MOUTH EVERY 4 HOURS AS NEEDED FOR MODERATE PAIN MAX DAILY AMOUNT 6 TABLETS       Allergies:  Allergies  Allergen Reactions  . No Known Allergies     Past Medical History, Surgical history, Social history, and Family History were reviewed and updated.  Review of Systems: As stated in the interim  history  Physical Exam:  weight is 100 lb (45.4 kg). Her oral temperature is 97.6 F (36.4 C). Her blood pressure is 137/70 and her pulse is 86. Her respiration is 16 and oxygen saturation is 100%.   Wt Readings from Last 3 Encounters:  01/03/17 100 lb (45.4 kg)  12/13/16 103 lb (46.7 kg)  12/06/16 103 lb (46.7 kg)    Well-developed and well-nourished white female. Head and neck exam shows no ocular or oral lesions. There are no palpable cervical or supraclavicular lymph nodes. Lungs are clear bilaterally. Cardiac exam regular rate and rhythm with no murmurs, rubs or bruits. Abdomen is soft. She has good bowel sounds. There is no fluid wave. There is no palpable liver or spleen tip. Back exam shows no tenderness over the spine, ribs or hips. Extremities shows no clubbing, cyanosis or edema. Neurological exam shows no focal neurological deficits. Skin exam shows no rashes, ecchymoses or petechia.    Lab Results  Component Value Date   WBC 5.9 01/03/2017   HGB 10.8 (L) 01/03/2017   HCT 32.8 (L) 01/03/2017   MCV 94 01/03/2017   PLT 237 01/03/2017   Lab Results  Component Value Date   FERRITIN 1,412 (H) 09/30/2016   IRON 140 09/30/2016   TIBC 240 09/30/2016   UIBC 100 (L) 09/30/2016  IRONPCTSAT 59 (H) 09/30/2016   Lab Results  Component Value Date   RBC 3.50 (L) 01/03/2017   Lab Results  Component Value Date   KAPLAMBRATIO 2.23 (H) 12/06/2016   Lab Results  Component Value Date   IGGSERUM 278 (L) 12/06/2016   IGMSERUM <5 (L) 12/06/2016   Lab Results  Component Value Date   MSPIKE 0.2 (H) 12/06/2016     Chemistry      Component Value Date/Time   NA 143 01/03/2017 1055   K 2.9 repeated (LL) 01/03/2017 1055   CL 105 01/03/2017 1055   CO2 28 01/03/2017 1055   BUN 18 01/03/2017 1055   CREATININE 0.7 01/03/2017 1055      Component Value Date/Time   CALCIUM 8.3 01/03/2017 1055   ALKPHOS 59 01/03/2017 1055   AST 26 01/03/2017 1055   ALT 20 01/03/2017 1055   BILITOT  0.60 01/03/2017 1055      Impression and Plan: Ms. Bovee is a very pleasant 66 yo caucasian female with IgA Kappa myeloma with renal failure due to light chain deposition.   Again, she has responded incredibly well.  I think that we have to consider a home or a biopsy on her. I know that she had one up in [place]. Somehow, we'll have to try to find the report. I would have to think that given her myeloma values, that she should have a pretty much normal bone marrow.  I told she and her family that this bone marrow test will help [place] decide if we can make changes to her protocol and how this might affect her being able to go back home to [facility].  When I first saw her, I never thought that she would be a candidate for stem cell transplant. However, given her marked improvement, she might be a candidate for stem cell transplantation. This would definitely require a long talk with she and her family.  We will see about scheduling the bone marrow test in about 2 or 3 weeks.  We will have to replace her potassium. We will call the potassium in for her.  I will plan to see her back in 3 or 4 weeks. Volanda Napoleon, MD 10/9/201811:44 AM

## 2017-01-03 NOTE — Addendum Note (Signed)
Addended by: Burney Gauze R on: 01/03/2017 01:48 PM   Modules accepted: Orders

## 2017-01-03 NOTE — Patient Instructions (Signed)
[facility] Discharge Instructions for Patients Receiving Chemotherapy  Today you received the following chemotherapy agents:  Cytoxan and Velcade  To help prevent nausea and vomiting after your treatment, we encourage you to take your nausea medication as ordered per MD.    If you develop nausea and vomiting that is not controlled by your nausea medication, call the clinic.   BELOW ARE SYMPTOMS THAT SHOULD BE REPORTED IMMEDIATELY:  *FEVER GREATER THAN 100.5 F  *CHILLS WITH OR WITHOUT FEVER  NAUSEA AND VOMITING THAT IS NOT CONTROLLED WITH YOUR NAUSEA MEDICATION  *UNUSUAL SHORTNESS OF BREATH  *UNUSUAL BRUISING OR BLEEDING  TENDERNESS IN MOUTH AND THROAT WITH OR WITHOUT PRESENCE OF ULCERS  *URINARY PROBLEMS  *BOWEL PROBLEMS  UNUSUAL RASH Items with * indicate a potential emergency and should be followed up as soon as possible.  Feel free to call the clinic should you have any questions or concerns. The clinic phone number is [phone].  Please show the [place] at check-in to the Emergency Department and triage nurse.

## 2017-01-04 LAB — KAPPA/LAMBDA LIGHT CHAINS
Ig Kappa Free Light Chain: 5.1 mg/L (ref 3.3–19.4)
Ig Lambda Free Light Chain: 2.3 mg/L — ABNORMAL LOW (ref 5.7–26.3)
Kappa/Lambda FluidC Ratio: 2.22 — ABNORMAL HIGH (ref 0.26–1.65)

## 2017-01-04 LAB — IGG, IGA, IGM
IGM (IMMUNOGLOBIN M), SRM: 6 mg/dL — AB (ref 26–217)
IgA, Qn, Serum: 17 mg/dL — ABNORMAL LOW (ref 87–352)
IgG, Qn, Serum: 248 mg/dL — ABNORMAL LOW (ref 700–1600)

## 2017-01-09 LAB — PROTEIN ELECTROPHORESIS, SERUM, WITH REFLEX
A/G RATIO SPE: 1.6 (ref 0.7–1.7)
ALBUMIN: 3.7 g/dL (ref 2.9–4.4)
Alpha 1: 0.3 g/dL (ref 0.0–0.4)
Alpha 2: 0.8 g/dL (ref 0.4–1.0)
BETA: 1 g/dL (ref 0.7–1.3)
GAMMA GLOBULIN: 0.2 g/dL — AB (ref 0.4–1.8)
GLOBULIN, TOTAL: 2.3 g/dL (ref 2.2–3.9)
Interpretation(See Below): 0
M-Spike, %: 0.2 g/dL — ABNORMAL HIGH
TOTAL PROTEIN: 6 g/dL (ref 6.0–8.5)

## 2017-01-10 ENCOUNTER — Ambulatory Visit (HOSPITAL_BASED_OUTPATIENT_CLINIC_OR_DEPARTMENT_OTHER): Payer: 59

## 2017-01-10 ENCOUNTER — Other Ambulatory Visit (HOSPITAL_BASED_OUTPATIENT_CLINIC_OR_DEPARTMENT_OTHER): Payer: 59

## 2017-01-10 VITALS — BP 126/81 | HR 97 | Temp 97.9°F | Resp 18

## 2017-01-10 DIAGNOSIS — Z5112 Encounter for antineoplastic immunotherapy: Secondary | ICD-10-CM | POA: Diagnosis not present

## 2017-01-10 DIAGNOSIS — C9000 Multiple myeloma not having achieved remission: Secondary | ICD-10-CM | POA: Diagnosis not present

## 2017-01-10 LAB — CBC WITH DIFFERENTIAL (CANCER CENTER ONLY)
BASO#: 0 10*3/uL (ref 0.0–0.2)
BASO%: 0.4 % (ref 0.0–2.0)
EOS%: 0.7 % (ref 0.0–7.0)
Eosinophils Absolute: 0 10*3/uL (ref 0.0–0.5)
HCT: 35.4 % (ref 34.8–46.6)
HGB: 11.5 g/dL — ABNORMAL LOW (ref 11.6–15.9)
LYMPH#: 1 10*3/uL (ref 0.9–3.3)
LYMPH%: 17.3 % (ref 14.0–48.0)
MCH: 30.5 pg (ref 26.0–34.0)
MCHC: 32.5 g/dL (ref 32.0–36.0)
MCV: 94 fL (ref 81–101)
MONO#: 0.5 10*3/uL (ref 0.1–0.9)
MONO%: 8.2 % (ref 0.0–13.0)
NEUT%: 73.4 % (ref 39.6–80.0)
NEUTROS ABS: 4.2 10*3/uL (ref 1.5–6.5)
PLATELETS: 208 10*3/uL (ref 145–400)
RBC: 3.77 10*6/uL (ref 3.70–5.32)
RDW: 15 % (ref 11.1–15.7)
WBC: 5.7 10*3/uL (ref 3.9–10.0)

## 2017-01-10 LAB — CMP (CANCER CENTER ONLY)
ALT(SGPT): 18 U/L (ref 10–47)
AST: 26 U/L (ref 11–38)
Albumin: 3.8 g/dL (ref 3.3–5.5)
Alkaline Phosphatase: 61 U/L (ref 26–84)
BILIRUBIN TOTAL: 0.6 mg/dL (ref 0.20–1.60)
BUN: 20 mg/dL (ref 7–22)
CHLORIDE: 107 meq/L (ref 98–108)
CO2: 27 meq/L (ref 18–33)
CREATININE: 1 mg/dL (ref 0.6–1.2)
Calcium: 9 mg/dL (ref 8.0–10.3)
GLUCOSE: 100 mg/dL (ref 73–118)
Potassium: 4.5 mEq/L (ref 3.3–4.7)
SODIUM: 145 meq/L (ref 128–145)
Total Protein: 6.7 g/dL (ref 6.4–8.1)

## 2017-01-10 MED ORDER — PROCHLORPERAZINE MALEATE 10 MG PO TABS
10.0000 mg | ORAL_TABLET | Freq: Once | ORAL | Status: AC
  Administered 2017-01-10: 10 mg via ORAL

## 2017-01-10 MED ORDER — PROCHLORPERAZINE MALEATE 10 MG PO TABS
ORAL_TABLET | ORAL | Status: AC
  Filled 2017-01-10: qty 1

## 2017-01-10 MED ORDER — BORTEZOMIB CHEMO SQ INJECTION 3.5 MG (2.5MG/ML)
1.3000 mg/m2 | Freq: Once | INTRAMUSCULAR | Status: AC
  Administered 2017-01-10: 1.75 mg via SUBCUTANEOUS
  Filled 2017-01-10: qty 1.75

## 2017-01-10 NOTE — Patient Instructions (Signed)
[facility] Discharge Instructions for Patients Receiving Chemotherapy  Today you received the following chemotherapy agents Velcade To help prevent nausea and vomiting after your treatment, we encourage you to take your nausea medication as prescribed.   If you develop nausea and vomiting that is not controlled by your nausea medication, call the clinic.   BELOW ARE SYMPTOMS THAT SHOULD BE REPORTED IMMEDIATELY:  *FEVER GREATER THAN 100.5 F  *CHILLS WITH OR WITHOUT FEVER  NAUSEA AND VOMITING THAT IS NOT CONTROLLED WITH YOUR NAUSEA MEDICATION  *UNUSUAL SHORTNESS OF BREATH  *UNUSUAL BRUISING OR BLEEDING  TENDERNESS IN MOUTH AND THROAT WITH OR WITHOUT PRESENCE OF ULCERS  *URINARY PROBLEMS  *BOWEL PROBLEMS  UNUSUAL RASH Items with * indicate a potential emergency and should be followed up as soon as possible.  Feel free to call the clinic should you have any questions or concerns. The clinic phone number is [phone].  Please show the CHEMO ALERT CARD at check-in to the Emergency Department and triage nurse.   

## 2017-01-19 ENCOUNTER — Other Ambulatory Visit: Payer: Self-pay | Admitting: General Surgery

## 2017-01-20 ENCOUNTER — Other Ambulatory Visit: Payer: Self-pay | Admitting: Radiology

## 2017-01-20 NOTE — Progress Notes (Signed)
The patient is seen in neurologic consultation at the request of Ennever, Rudell Cobb, MD for the evaluation of memory.  The patient is accompanied by daughter, son and husband who supplements the history.  Son states that in May patient had intractable back pain and ended up in hospital.  While in hospital, she experienced MS change (was living in [place]).  She was ultimately dx with multiple myeloma and renal failure.  She doesn't remember that 6-7 weeks.  Patient moved here but still has some "memory gaps."  Family doesn't know if there was memory issues prior as the family didn't see the patient.  Patient and husband live independently here in a townhome.  Pt has been here since the beginning of July but she goes back and forth to [place].  Pt drives back there herself.  Pt would state that her memory is normal but acknowledges it wasn't in the hospital.  Her family states that her memory is not good and conversations are circular.  Primary caregiver is patient.    Living situation:  Pt lives with their spouse.  The patient does not do the finances in the home; patient does state that she has her own accounts that she balances.  The patient does drive.   There have not been any motor vehicle accidents in the recent years.  The patient does cook but they mostly eat out.   The stovetop has not been left on accidentally.  ADL's:  The patient is able to perform her own ADL's. The family monitors medication usage and family sets up medications.  Family started doing that after she got out of the hospital.  The patients bladder and bowel are under good control.   Behavior:    There have been no behavioral changes over the years.    Identified concerns:  Family and patient are concerned about inability to maintain adequate nutrition but they don't think that is medication related; they are most concerned about short term memory issues and repetition of things  Neuroimaging of the brain has not  previously been performed.  Had CT scan of brain at [facility] in [place] and just told that had evidence of myeloma in the skull.    Allergies  Allergen Reactions  . Milk-Related Compounds Anaphylaxis    Throat closes   . No Known Allergies     Current Outpatient Prescriptions on File Prior to Visit  Medication Sig Dispense Refill  . dexamethasone (DECADRON) 4 MG tablet Take 5 pills once a week with food. 100 tablet 1  . famciclovir (FAMVIR) 250 MG tablet Take 1 tablet (250 mg total) by mouth daily. 30 tablet 8  . potassium chloride SA (K-DUR,KLOR-CON) 20 MEQ tablet Take 2 pills twice a day for 5 days and then one pill twice a day. (Patient taking differently: Take 20 mEq by mouth daily. ) 100 tablet 2   No current facility-administered medications on file prior to visit.     Past Medical History:  Diagnosis Date  . Cancer ([place])    multiple myeloma  . Hypertension    patient denies  . Renal disorder    acute renal failure due to myeloma    Past Surgical History:  Procedure Laterality Date  . NO PAST SURGERIES      Social History   Social History  . Marital status: Married    Spouse name: N/A  . Number of children: N/A  . Years of education: N/A   Occupational History  .  retired     Designer, television/film set   Social History Main Topics  . Smoking status: Never Smoker  . Smokeless tobacco: Never Used  . Alcohol use No  . Drug use: No  . Sexual activity: Not on file   Other Topics Concern  . Not on file   Social History Narrative  . No narrative on file    Family Status  Relation Status  . Mother Deceased  . Father Deceased  . Brother Alive  . Brother Alive  . Daughter Alive  . Son Alive    ROS:  A complete 10 system ROS was obtained and was unremarkable except as above.   VITALS:   Vitals:   01/24/17 0911  BP: 130/80  Pulse: 96  SpO2: 98%  Weight: 101 lb (45.8 kg)  Height: 5' 2"  (1.575 m)   HEENT:  Normocephalic, atraumatic. The  mucous membranes are moist. The superficial temporal arteries are without ropiness or tenderness. Cardiovascular: Regular rate and rhythm. Lungs: Clear to auscultation bilaterally. Neck: There are no carotid bruits noted bilaterally.  NEUROLOGICAL:  Orientation:  Montreal Cognitive Assessment  01/24/2017  Visuospatial/ Executive (0/5) 3  Naming (0/3) 3  Attention: Read list of digits (0/2) 2  Attention: Read list of letters (0/1) 1  Attention: Serial 7 subtraction starting at 100 (0/3) 1  Language: Repeat phrase (0/2) 2  Language : Fluency (0/1) 0  Abstraction (0/2) 2  Delayed Recall (0/5) 0  Orientation (0/6) 5  Total 19  Adjusted Score (based on education) 19   Cranial nerves: There is good facial symmetry. The pupils are equal round and reactive to light bilaterally. Funduscopic exam reveals clear disc margins bilaterally. Extraocular muscles are intact and visual fields are full to confrontational testing. Speech is fluent and clear. Soft palate rises symmetrically and there is no tongue deviation. Hearing is intact to conversational tone. Tone: Tone is good throughout. Sensation: Sensation is intact to light touch and pinprick throughout. Vibration is intact at the bilateral big toe. There is no extinction with double simultaneous stimulation. There is no sensory dermatomal level identified. Coordination:  The patient has no difficulty with RAM's or FNF bilaterally. Motor: Strength is 5/5 in the bilateral upper and lower extremities. There is no pronator drift.  There are no fasciculations noted. DTR's: Deep tendon reflexes are 3/4 at the bilateral biceps, triceps, brachioradialis, patella and achilles.  Plantar responses are downgoing bilaterally. Gait and Station: The patient is able to ambulate without difficulty. The patient is able to heel toe walk without any difficulty. The patient is able to ambulate in a tandem fashion. The patient is able to stand in the Romberg  position.  Labs:    Chemistry      Component Value Date/Time   NA 145 01/10/2017 1058   K 4.5 01/10/2017 1058   CL 107 01/10/2017 1058   CO2 27 01/10/2017 1058   BUN 20 01/10/2017 1058   CREATININE 1.0 01/10/2017 1058      Component Value Date/Time   CALCIUM 9.0 01/10/2017 1058   ALKPHOS 61 01/10/2017 1058   AST 26 01/10/2017 1058   ALT 18 01/10/2017 1058   BILITOT 0.60 01/10/2017 1058     No results found for: VITAMINB12      IMPRESSIONS/RECOMMENDATIONS:  1.  encephalopathy  -There is 2 possiblities.  One is that she had/has steroid induced psychosis/delirium.  The other, and more likely possiblity, is that she has baseline dementia with superimposed steroid induced delirium.  I  talked to them about lab tests and MRI brain.  Patient refused.  I discussed neurocognitive testing in detail but the patient refused.  Discussed driving.  She is driving back and forth to [place].  She should have a driving evaluation and if she refuses, she should not be driving.  She refuses all of this.  She became very frustrated.  I offered her another opinion but she refused that.  She and her family can let me know if they change their mind.  Her son and daughter in law were very thankful to me.  They could probably use some social work resources but the patient left the office too fast to provide them.  Much greater than 50% of this visit was spent in counseling and coordinating care.  Total face to face time:  60 min  Cc:  Volanda Napoleon, MD

## 2017-01-23 ENCOUNTER — Encounter (HOSPITAL_COMMUNITY): Payer: Self-pay

## 2017-01-23 ENCOUNTER — Ambulatory Visit (HOSPITAL_COMMUNITY)
Admission: RE | Admit: 2017-01-23 | Discharge: 2017-01-23 | Disposition: A | Discharge status: Home / Self Care | Payer: Medicare (Managed Care) | Source: Ambulatory Visit | Attending: Hematology & Oncology | Admitting: Hematology & Oncology

## 2017-01-23 DIAGNOSIS — Z9889 Other specified postprocedural states: Secondary | ICD-10-CM | POA: Insufficient documentation

## 2017-01-23 DIAGNOSIS — N178 Other acute kidney failure: Secondary | ICD-10-CM | POA: Diagnosis not present

## 2017-01-23 DIAGNOSIS — D7589 Other specified diseases of blood and blood-forming organs: Secondary | ICD-10-CM | POA: Diagnosis not present

## 2017-01-23 DIAGNOSIS — C9000 Multiple myeloma not having achieved remission: Secondary | ICD-10-CM

## 2017-01-23 DIAGNOSIS — I10 Essential (primary) hypertension: Secondary | ICD-10-CM | POA: Insufficient documentation

## 2017-01-23 DIAGNOSIS — Z79899 Other long term (current) drug therapy: Secondary | ICD-10-CM | POA: Insufficient documentation

## 2017-01-23 DIAGNOSIS — D649 Anemia, unspecified: Secondary | ICD-10-CM | POA: Diagnosis not present

## 2017-01-23 DIAGNOSIS — Z91011 Allergy to milk products: Secondary | ICD-10-CM | POA: Diagnosis not present

## 2017-01-23 DIAGNOSIS — R897 Abnormal histological findings in specimens from other organs, systems and tissues: Secondary | ICD-10-CM | POA: Insufficient documentation

## 2017-01-23 LAB — CBC WITH DIFFERENTIAL/PLATELET
Basophils Absolute: 0 10*3/uL (ref 0.0–0.1)
Basophils Relative: 0 %
EOS ABS: 0 10*3/uL (ref 0.0–0.7)
EOS PCT: 1 %
HCT: 36.2 % (ref 36.0–46.0)
Hemoglobin: 11.9 g/dL — ABNORMAL LOW (ref 12.0–15.0)
LYMPHS ABS: 1.2 10*3/uL (ref 0.7–4.0)
Lymphocytes Relative: 21 %
MCH: 29.9 pg (ref 26.0–34.0)
MCHC: 32.9 g/dL (ref 30.0–36.0)
MCV: 91 fL (ref 78.0–100.0)
MONO ABS: 0.5 10*3/uL (ref 0.1–1.0)
Monocytes Relative: 8 %
Neutro Abs: 4.1 10*3/uL (ref 1.7–7.7)
Neutrophils Relative %: 70 %
PLATELETS: 236 10*3/uL (ref 150–400)
RBC: 3.98 MIL/uL (ref 3.87–5.11)
RDW: 15.3 % (ref 11.5–15.5)
WBC: 5.9 10*3/uL (ref 4.0–10.5)

## 2017-01-23 LAB — PROTIME-INR
INR: 0.91
Prothrombin Time: 12.2 seconds (ref 11.4–15.2)

## 2017-01-23 MED ORDER — FENTANYL CITRATE (PF) 100 MCG/2ML IJ SOLN
INTRAMUSCULAR | Status: AC
  Filled 2017-01-23: qty 4

## 2017-01-23 MED ORDER — NALOXONE HCL 0.4 MG/ML IJ SOLN
INTRAMUSCULAR | Status: AC
  Filled 2017-01-23: qty 1

## 2017-01-23 MED ORDER — SODIUM CHLORIDE 0.9 % IV SOLN
INTRAVENOUS | Status: DC
  Administered 2017-01-23: 09:00:00 via INTRAVENOUS

## 2017-01-23 MED ORDER — FLUMAZENIL 0.5 MG/5ML IV SOLN
INTRAVENOUS | Status: AC
  Filled 2017-01-23: qty 5

## 2017-01-23 MED ORDER — FENTANYL CITRATE (PF) 100 MCG/2ML IJ SOLN
INTRAMUSCULAR | Status: AC | PRN
  Administered 2017-01-23 (×2): 50 ug via INTRAVENOUS

## 2017-01-23 MED ORDER — MIDAZOLAM HCL 2 MG/2ML IJ SOLN
INTRAMUSCULAR | Status: AC | PRN
  Administered 2017-01-23 (×2): 1 mg via INTRAVENOUS

## 2017-01-23 MED ORDER — LIDOCAINE-EPINEPHRINE 1 %-1:100000 IJ SOLN
INTRAMUSCULAR | Status: AC | PRN
  Administered 2017-01-23: 10 mL via INTRADERMAL

## 2017-01-23 MED ORDER — MIDAZOLAM HCL 2 MG/2ML IJ SOLN
INTRAMUSCULAR | Status: AC
  Filled 2017-01-23: qty 4

## 2017-01-23 NOTE — Procedures (Signed)
Pre-procedure Diagnosis: Multiple myeloma Post-procedure Diagnosis: Same  Technically successful CT guided bone marrow aspiration and biopsy of left iliac crest.   Complications: None Immediate  EBL: None  SignedSandi Mariscal Pager: [phone] 01/23/2017, 11:32 AM

## 2017-01-23 NOTE — Discharge Instructions (Signed)
Bone Marrow Aspiration and Bone Marrow Biopsy, Adult, Care After This sheet gives you information about how to care for yourself after your procedure. Your health care provider may also give you more specific instructions. If you have problems or questions, contact your health care provider. What can I expect after the procedure? After the procedure, it is common to have:  Mild pain and tenderness.  Swelling.  Bruising.  Follow these instructions at home:  Take over-the-counter or prescription medicines only as told by your health care provider.  Do not take baths, swim, or use a hot tub until your health care provider approves. Ask if you can take a shower or have a sponge bath.  You may shower tomorrow 01/24/17.  Follow instructions from your health care provider about how to take care of the puncture site. Make sure you: ? Wash your hands with soap and water before you change your bandage (dressing). If soap and water are not available, use hand sanitizer. ? Change your dressing as told by your health care provider.  You may remove dressing tomorrow 01/24/17.  Check your puncture siteevery day for signs of infection. Check for: ? More redness, swelling, or pain. ? More fluid or blood. ? Warmth. ? Pus or a bad smell.  Return to your normal activities as told by your health care provider. Ask your health care provider what activities are safe for you.  Do not drive for 24 hours if you were given a medicine to help you relax (sedative).  Keep all follow-up visits as told by your health care provider. This is important. Contact a health care provider if:  You have more redness, swelling, or pain around the puncture site.  You have more fluid or blood coming from the puncture site.  Your puncture site feels warm to the touch.  You have pus or a bad smell coming from the puncture site.  You have a fever.  Your pain is not controlled with medicine. This information is not  intended to replace advice given to you by your health care provider. Make sure you discuss any questions you have with your health care provider. Document Released: 10/01/2004 Document Revised: 10/02/2015 Document Reviewed: 08/26/2015 Elsevier Interactive Patient Education  2018 [place].  Moderate Conscious Sedation, Adult, Care After These instructions provide you with information about caring for yourself after your procedure. Your health care provider may also give you more specific instructions. Your treatment has been planned according to current medical practices, but problems sometimes occur. Call your health care provider if you have any problems or questions after your procedure. What can I expect after the procedure? After your procedure, it is common:  To feel sleepy for several hours.  To feel clumsy and have poor balance for several hours.  To have poor judgment for several hours.  To vomit if you eat too soon.  Follow these instructions at home: For at least 24 hours after the procedure:   Do not: ? Participate in activities where you could fall or become injured. ? Drive. ? Use heavy machinery. ? Drink alcohol. ? Take sleeping pills or medicines that cause drowsiness. ? Make important decisions or sign legal documents. ? Take care of children on your own.  Rest. Eating and drinking  Follow the diet recommended by your health care provider.  If you vomit: ? Drink water, juice, or soup when you can drink without vomiting. ? Make sure you have little or no nausea before eating solid  foods. General instructions  Have a responsible adult stay with you until you are awake and alert.  Take over-the-counter and prescription medicines only as told by your health care provider.  If you smoke, do not smoke without supervision.  Keep all follow-up visits as told by your health care provider. This is important. Contact a health care provider if:  You keep  feeling nauseous or you keep vomiting.  You feel light-headed.  You develop a rash.  You have a fever. Get help right away if:  You have trouble breathing. This information is not intended to replace advice given to you by your health care provider. Make sure you discuss any questions you have with your health care provider. Document Released: 01/02/2013 Document Revised: 08/17/2015 Document Reviewed: 07/04/2015 Elsevier Interactive Patient Education  [employer].

## 2017-01-23 NOTE — Sedation Documentation (Signed)
Patient denies pain and is resting comfortably.  

## 2017-01-23 NOTE — H&P (Signed)
Chief Complaint: Patient was seen in consultation today for bone marrow biopsy at the request of Ennever,Peter R  Referring Physician(s): Ennever,Peter R  Supervising Physician: Sandi Mariscal  Patient Status: [name]  History of Present Illness: Jeanne Martinez is a 66 y.o. female with IgA kappa myeloma. She was originally diagnosed in [place] and had prior bone marrow biopsy up there. She has been receiving treatment from Dr. Marin Olp and is now referred for bone marrow biopsy. PMhx, meds, labs, imaging reviewed. Has been NPO this am. Family at bedside.  Past Medical History:  Diagnosis Date  . Cancer ([place])    multiple myeloma  . Hypertension   . Renal disorder    acute renal failure due to myeloma    History reviewed. No pertinent surgical history.  Allergies: Milk-related compounds and No known allergies  Medications: Prior to Admission medications   Medication Sig Start Date End Date Taking? Authorizing Provider  famciclovir (FAMVIR) 250 MG tablet Take 1 tablet (250 mg total) by mouth daily. 09/30/16  Yes Ennever, Rudell Cobb, MD  potassium chloride SA (K-DUR,KLOR-CON) 20 MEQ tablet Take 2 pills twice a day for 5 days and then one pill twice a day. 01/03/17  Yes Ennever, Rudell Cobb, MD  dexamethasone (DECADRON) 4 MG tablet Take 5 pills once a week with food. 09/30/16   Volanda Napoleon, MD  oxyCODONE-acetaminophen (PERCOCET/ROXICET) 5-325 MG tablet TAKE 1 TABLET BY MOUTH EVERY 4 HOURS AS NEEDED FOR MODERATE PAIN MAX DAILY AMOUNT 6 TABLETS 09/15/16   [provider]     History reviewed. No pertinent family history.  Social History   Social History  . Marital status: Married    Spouse name: N/A  . Number of children: N/A  . Years of education: N/A   Social History Main Topics  . Smoking status: Never Smoker  . Smokeless tobacco: Never Used  . Alcohol use No  . Drug use: No  . Sexual activity: Not Asked   Other Topics Concern  . None   Social  History Narrative  . None    Review of Systems: A 12 point ROS discussed and pertinent positives are indicated in the HPI above.  All other systems are negative.  Review of Systems  Vital Signs: BP (!) 159/85 (BP Location: Right Arm)   Pulse 89   Temp 97.7 F (36.5 C) (Oral)   Resp 16   SpO2 100%   Physical Exam  Constitutional: She is oriented to person, place, and time. She appears well-developed. No distress.  HENT:  Head: Normocephalic.  Mouth/Throat: Oropharynx is clear and moist.  Neck: Normal range of motion. No JVD present.  Cardiovascular: Normal rate, regular rhythm and normal heart sounds.   Pulmonary/Chest: Effort normal and breath sounds normal. No respiratory distress.  Neurological: She is alert and oriented to person, place, and time.  Skin: Skin is warm and dry.  Psychiatric: She has a normal mood and affect.    Imaging: No results found.  Labs:  CBC:  Recent Labs  12/13/16 1320 01/03/17 1055 01/10/17 1058 01/23/17 0909  WBC 3.7* 5.9 5.7 5.9  HGB 11.6 10.8* 11.5* 11.9*  HCT 35.2 32.8* 35.4 36.2  PLT 178 237 208 236    COAGS:  Recent Labs  01/23/17 0909  INR 0.91    BMP:  Recent Labs  12/06/16 1118 12/13/16 1320 01/03/17 1055 01/10/17 1058  NA 140 144 143 145  K 3.7 4.2 2.9 repeated* 4.5  CL 103 103 105  107  CO2 28 28 28 27   GLUCOSE 100 87 96 100  BUN 16 21 18 20   CALCIUM 8.2 9.8 8.3 9.0  CREATININE 1.3* 1.3* 0.7 1.0    LIVER FUNCTION TESTS:  Recent Labs  12/06/16 1118 12/13/16 1320 01/03/17 1055 01/10/17 1058  BILITOT 0.70 0.60 0.60 0.60  AST 33 30 26 26   ALT 32 29 20 18   ALKPHOS 91* 86* 59 61  PROT 5.9*  6.2* 6.6 6.0  6.4 6.7  ALBUMIN 3.6 3.6 3.8 3.8    TUMOR MARKERS: No results for input(s): AFPTM, CEA, CA199, CHROMGRNA in the last 8760 hours.  Assessment and Plan: Myeloma For CT guided bone marrow biopsy Labs ok Risks and benefits discussed with the patient including, but not limited to bleeding,  infection, damage to adjacent structures or low yield requiring additional tests. All of the patient's questions were answered, patient is agreeable to proceed. Consent signed and in chart.    Thank you for this interesting consult.  I greatly enjoyed meeting Harlo Jaso and look forward to participating in their care.  A copy of this report was sent to the requesting provider on this date.  Electronically Signed: Ascencion Dike, PA-C 01/23/2017, 9:55 AM   I spent a total of 20 minutes in face to face in clinical consultation, greater than 50% of which was counseling/coordinating care for bone marrow biopsy

## 2017-01-24 ENCOUNTER — Other Ambulatory Visit (HOSPITAL_BASED_OUTPATIENT_CLINIC_OR_DEPARTMENT_OTHER): Payer: No Typology Code available for payment source

## 2017-01-24 ENCOUNTER — Ambulatory Visit: Payer: Medicare Other | Admitting: Hematology & Oncology

## 2017-01-24 ENCOUNTER — Ambulatory Visit: Payer: Medicare Other

## 2017-01-24 ENCOUNTER — Ambulatory Visit (INDEPENDENT_AMBULATORY_CARE_PROVIDER_SITE_OTHER): Payer: Medicare (Managed Care) | Admitting: Neurology

## 2017-01-24 ENCOUNTER — Other Ambulatory Visit: Payer: Medicare Other

## 2017-01-24 ENCOUNTER — Ambulatory Visit (HOSPITAL_BASED_OUTPATIENT_CLINIC_OR_DEPARTMENT_OTHER): Payer: No Typology Code available for payment source | Admitting: Hematology & Oncology

## 2017-01-24 ENCOUNTER — Encounter: Payer: Self-pay | Admitting: Neurology

## 2017-01-24 ENCOUNTER — Ambulatory Visit (HOSPITAL_BASED_OUTPATIENT_CLINIC_OR_DEPARTMENT_OTHER): Payer: Medicare (Managed Care)

## 2017-01-24 VITALS — BP 150/85 | HR 98 | Temp 98.2°F | Resp 16 | Wt 100.0 lb

## 2017-01-24 VITALS — BP 130/80 | HR 96 | Ht 62.0 in | Wt 101.0 lb

## 2017-01-24 DIAGNOSIS — C9000 Multiple myeloma not having achieved remission: Secondary | ICD-10-CM

## 2017-01-24 DIAGNOSIS — N19 Unspecified kidney failure: Secondary | ICD-10-CM | POA: Diagnosis not present

## 2017-01-24 DIAGNOSIS — G9340 Encephalopathy, unspecified: Secondary | ICD-10-CM

## 2017-01-24 DIAGNOSIS — Z5111 Encounter for antineoplastic chemotherapy: Secondary | ICD-10-CM

## 2017-01-24 DIAGNOSIS — Z5112 Encounter for antineoplastic immunotherapy: Secondary | ICD-10-CM | POA: Diagnosis not present

## 2017-01-24 LAB — CBC WITH DIFFERENTIAL (CANCER CENTER ONLY)
BASO#: 0 10*3/uL (ref 0.0–0.2)
BASO%: 0 % (ref 0.0–2.0)
EOS ABS: 0 10*3/uL (ref 0.0–0.5)
EOS%: 0 % (ref 0.0–7.0)
HCT: 32.5 % — ABNORMAL LOW (ref 34.8–46.6)
HGB: 10.8 g/dL — ABNORMAL LOW (ref 11.6–15.9)
LYMPH#: 0.9 10*3/uL (ref 0.9–3.3)
LYMPH%: 8.6 % — AB (ref 14.0–48.0)
MCH: 30.3 pg (ref 26.0–34.0)
MCHC: 33.2 g/dL (ref 32.0–36.0)
MCV: 91 fL (ref 81–101)
MONO#: 0.3 10*3/uL (ref 0.1–0.9)
MONO%: 3.2 % (ref 0.0–13.0)
NEUT#: 8.7 10*3/uL — ABNORMAL HIGH (ref 1.5–6.5)
NEUT%: 88.2 % — AB (ref 39.6–80.0)
PLATELETS: 262 10*3/uL (ref 145–400)
RBC: 3.57 10*6/uL — AB (ref 3.70–5.32)
RDW: 14.9 % (ref 11.1–15.7)
WBC: 9.8 10*3/uL (ref 3.9–10.0)

## 2017-01-24 LAB — CMP (CANCER CENTER ONLY)
ALT(SGPT): 21 U/L (ref 10–47)
AST: 25 U/L (ref 11–38)
Albumin: 3.8 g/dL (ref 3.3–5.5)
Alkaline Phosphatase: 49 U/L (ref 26–84)
BILIRUBIN TOTAL: 0.6 mg/dL (ref 0.20–1.60)
BUN, Bld: 23 mg/dL — ABNORMAL HIGH (ref 7–22)
CHLORIDE: 104 meq/L (ref 98–108)
CO2: 25 mEq/L (ref 18–33)
CREATININE: 0.9 mg/dL (ref 0.6–1.2)
Calcium: 8 mg/dL (ref 8.0–10.3)
Glucose, Bld: 158 mg/dL — ABNORMAL HIGH (ref 73–118)
POTASSIUM: 4.4 meq/L (ref 3.3–4.7)
SODIUM: 148 meq/L — AB (ref 128–145)
TOTAL PROTEIN: 6.6 g/dL (ref 6.4–8.1)

## 2017-01-24 MED ORDER — BORTEZOMIB CHEMO SQ INJECTION 3.5 MG (2.5MG/ML)
1.3000 mg/m2 | Freq: Once | INTRAMUSCULAR | Status: AC
  Administered 2017-01-24: 1.75 mg via SUBCUTANEOUS
  Filled 2017-01-24: qty 1.75

## 2017-01-24 MED ORDER — PALONOSETRON HCL INJECTION 0.25 MG/5ML
0.2500 mg | Freq: Once | INTRAVENOUS | Status: AC
  Administered 2017-01-24: 0.25 mg via INTRAVENOUS

## 2017-01-24 MED ORDER — PALONOSETRON HCL INJECTION 0.25 MG/5ML
INTRAVENOUS | Status: AC
  Filled 2017-01-24: qty 5

## 2017-01-24 MED ORDER — SODIUM CHLORIDE 0.9 % IV SOLN
400.0000 mg/m2 | Freq: Once | INTRAVENOUS | Status: AC
  Administered 2017-01-24: 540 mg via INTRAVENOUS
  Filled 2017-01-24: qty 27

## 2017-01-24 MED ORDER — SODIUM CHLORIDE 0.9 % IV SOLN
Freq: Once | INTRAVENOUS | Status: AC
  Administered 2017-01-24: 14:00:00 via INTRAVENOUS

## 2017-01-24 NOTE — Progress Notes (Signed)
Hematology and Oncology Follow Up Visit  Jeanne Martinez [id number] [birth year] 66 y.o. 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  She had her bone marrow biopsy done yesterday. I'm very surprised that he got the results back already. However, the results are fantastic. The pathology report ([id number]) shows a normocellular marrow with only 1% plasma cells The cytogenetics and FISH are pending.  I am absolutely impressed with how well she has done. We first saw her back in July, she came in in a wheelchair. She had renal failure. She was on dialysis. She has responded beautifully.  I do not believe that she is a good candidate for stem cell transplantation. As such, we will get her on maintenance therapy.  She and her husband go to [facility] all the time. She drives.  She has had no nausea or vomiting. She's had no change in bowel or bladder habits. She's had no cough. There's been no bleeding. She's had no leg swelling.  Her last myeloma studies showed an M spike of 0.2 g/dL. Her IgA level was 17 mg/dL. Her Kappa Light chain was 0.5 mg/dL.   She is eating well. She has had no diarrhea.  Overall, her performance status is ECOG 1.  Medications:  Allergies as of 01/24/2017      Reactions   Milk-related Compounds Anaphylaxis   Throat closes    No Known Allergies       Medication List       Accurate as of 01/24/17  1:40 PM. Always use your most recent med list.          dexamethasone 4 MG tablet Commonly known as:  DECADRON Take 5 pills once a week with food.   famciclovir 250 MG tablet Commonly known as:  FAMVIR Take 1 tablet (250 mg total) by mouth daily.   potassium chloride SA 20 MEQ tablet Commonly known as:  K-DUR,KLOR-CON Take 2 pills  twice a day for 5 days and then one pill twice a day.       Allergies:  Allergies  Allergen Reactions  . Milk-Related Compounds Anaphylaxis    Throat closes   . No Known Allergies     Past Medical History, Surgical history, Social history, and Family History were reviewed and updated.  Review of Systems: As stated in the interim history  Physical Exam:  weight is 100 lb (45.4 kg). Her oral temperature is 98.2 F (36.8 C). Her blood pressure is 150/85 (abnormal) and her pulse is 98. Her respiration is 16 and oxygen saturation is 100%.   Wt Readings from Last 3 Encounters:  01/24/17 100 lb (45.4 kg)  01/24/17 101 lb (45.8 kg)  01/03/17 100 lb (45.4 kg)     Well-developed and well-nourished white female. She is a little bit thin. Head and neck exam shows no ocular or oral lesions. She has no palpable cervical or supraclavicular lymph nodes. Lungs are clear bilaterally. Cardiac exam regular rate and rhythm with no murmurs, rubs or bruits. Abdomen is soft. She has good bowel sounds. There is no fluid wave. There is no palpable liver or spleen tip. Back exam shows no tenderness over the spine, ribs or hips. Extremities shows no clubbing, cyanosis or edema. Neurological exam shows no focal neurological deficits.  Skin exam shows no rashes, ecchymoses or petechia.  Lab Results  Component Value Date   WBC 9.8 01/24/2017   HGB 10.8 (L) 01/24/2017   HCT 32.5 (L) 01/24/2017   MCV 91 01/24/2017   PLT 262 01/24/2017   Lab Results  Component Value Date   FERRITIN 1,412 (H) 09/30/2016   IRON 140 09/30/2016   TIBC 240 09/30/2016   UIBC 100 (L) 09/30/2016   IRONPCTSAT 59 (H) 09/30/2016   Lab Results  Component Value Date   RBC 3.57 (L) 01/24/2017   Lab Results  Component Value Date   KAPLAMBRATIO 2.22 (H) 01/03/2017   Lab Results  Component Value Date   IGGSERUM 248 (L) 01/03/2017   IGMSERUM 6 (L) 01/03/2017   Lab Results  Component Value Date   MSPIKE 0.2 (H) 01/03/2017      Chemistry      Component Value Date/Time   NA 148 (H) 01/24/2017 1301   K 4.4 01/24/2017 1301   CL 104 01/24/2017 1301   CO2 25 01/24/2017 1301   BUN 23 (H) 01/24/2017 1301   CREATININE 0.9 01/24/2017 1301      Component Value Date/Time   CALCIUM 8.0 01/24/2017 1301   ALKPHOS 49 01/24/2017 1301   AST 25 01/24/2017 1301   ALT 21 01/24/2017 1301   BILITOT 0.60 01/24/2017 1301      Impression and Plan: Jeanne Martinez is a very pleasant 66 yo caucasian female with IgA Kappa myeloma with renal failure due to light chain deposition.   Again, she has responded incredibly well.   I'm glad that we did the bone marrow test. I'm sure that she had a bone marrow test while living in [place] but I cannot find any of the results in the records that we have.  At this point, I think we should consider her for some maintenance therapy. I think Cytoxan/Velcade would be perfect for maintenance therapy.   I think that we can treat her every other week for right now.We will get her through the holidays. After the holidays if everything looks stable, then we might consider every third week.  I stillI have not totally discounted the possibility of a stem cell transplant. I will see what her cytogenetics and FISH    I spent about 40 minutes with she and her family. I answered their questions. I reviewed the lab work and bone marrow results. They are all very excited that she is done so well.    Volanda Napoleon, MD 10/30/20181:40 PM

## 2017-01-24 NOTE — Patient Instructions (Signed)
[facility] Discharge Instructions for Patients Receiving Chemotherapy  Today you received the following chemotherapy agents:  Velcade and Cytoxan  To help prevent nausea and vomiting after your treatment, we encourage you to take your nausea medication as ordered per MD.    If you develop nausea and vomiting that is not controlled by your nausea medication, call the clinic.   BELOW ARE SYMPTOMS THAT SHOULD BE REPORTED IMMEDIATELY:  *FEVER GREATER THAN 100.5 F  *CHILLS WITH OR WITHOUT FEVER  NAUSEA AND VOMITING THAT IS NOT CONTROLLED WITH YOUR NAUSEA MEDICATION  *UNUSUAL SHORTNESS OF BREATH  *UNUSUAL BRUISING OR BLEEDING  TENDERNESS IN MOUTH AND THROAT WITH OR WITHOUT PRESENCE OF ULCERS  *URINARY PROBLEMS  *BOWEL PROBLEMS  UNUSUAL RASH Items with * indicate a potential emergency and should be followed up as soon as possible.  Feel free to call the clinic should you have any questions or concerns. The clinic phone number is [phone].  Please show the [facility] at check-in to the Emergency Department and triage nurse.

## 2017-01-25 ENCOUNTER — Ambulatory Visit: Payer: Medicare Other | Admitting: Neurology

## 2017-01-25 LAB — PROTEIN ELECTROPHORESIS, SERUM, WITH REFLEX
A/G Ratio: 1.5 (ref 0.7–1.7)
ALPHA 2: 0.9 g/dL (ref 0.4–1.0)
Albumin: 3.8 g/dL (ref 2.9–4.4)
Alpha 1: 0.3 g/dL (ref 0.0–0.4)
BETA: 1 g/dL (ref 0.7–1.3)
GAMMA GLOBULIN: 0.3 g/dL — AB (ref 0.4–1.8)
GLOBULIN, TOTAL: 2.5 g/dL (ref 2.2–3.9)
Total Protein: 6.3 g/dL (ref 6.0–8.5)

## 2017-01-25 LAB — KAPPA/LAMBDA LIGHT CHAINS
IG KAPPA FREE LIGHT CHAIN: 5.5 mg/L (ref 3.3–19.4)
IG LAMBDA FREE LIGHT CHAIN: 2.1 mg/L — AB (ref 5.7–26.3)
Kappa/Lambda FluidC Ratio: 2.62 — ABNORMAL HIGH (ref 0.26–1.65)

## 2017-01-25 LAB — IGG, IGA, IGM
IGA/IMMUNOGLOBULIN A, SERUM: 12 mg/dL — AB (ref 87–352)
IGG (IMMUNOGLOBIN G), SERUM: 290 mg/dL — AB (ref 700–1600)
IGM (IMMUNOGLOBIN M), SRM: 9 mg/dL — AB (ref 26–217)

## 2017-01-31 ENCOUNTER — Ambulatory Visit: Payer: Medicare Other

## 2017-01-31 ENCOUNTER — Other Ambulatory Visit: Payer: Medicare Other

## 2017-01-31 ENCOUNTER — Encounter: Payer: Self-pay | Admitting: Hematology & Oncology

## 2017-02-02 ENCOUNTER — Other Ambulatory Visit: Payer: Medicare Other

## 2017-02-02 ENCOUNTER — Ambulatory Visit: Payer: Medicare Other

## 2017-02-06 ENCOUNTER — Ambulatory Visit: Payer: Medicare Other

## 2017-02-06 ENCOUNTER — Ambulatory Visit: Payer: No Typology Code available for payment source | Admitting: Family

## 2017-02-06 ENCOUNTER — Other Ambulatory Visit: Payer: Medicare Other

## 2017-02-08 ENCOUNTER — Ambulatory Visit: Payer: No Typology Code available for payment source

## 2017-02-08 ENCOUNTER — Other Ambulatory Visit (HOSPITAL_BASED_OUTPATIENT_CLINIC_OR_DEPARTMENT_OTHER): Payer: Medicare (Managed Care)

## 2017-02-08 ENCOUNTER — Encounter: Payer: Self-pay | Admitting: Family

## 2017-02-08 ENCOUNTER — Other Ambulatory Visit: Payer: Self-pay

## 2017-02-08 ENCOUNTER — Ambulatory Visit (HOSPITAL_BASED_OUTPATIENT_CLINIC_OR_DEPARTMENT_OTHER): Payer: Medicare (Managed Care) | Admitting: Family

## 2017-02-08 VITALS — BP 135/69 | HR 110 | Temp 97.7°F | Resp 18 | Wt 101.0 lb

## 2017-02-08 DIAGNOSIS — N179 Acute kidney failure, unspecified: Secondary | ICD-10-CM

## 2017-02-08 DIAGNOSIS — C9000 Multiple myeloma not having achieved remission: Secondary | ICD-10-CM

## 2017-02-08 LAB — CBC WITH DIFFERENTIAL (CANCER CENTER ONLY)
BASO#: 0 10*3/uL (ref 0.0–0.2)
BASO%: 0 % (ref 0.0–2.0)
EOS%: 0 % (ref 0.0–7.0)
Eosinophils Absolute: 0 10*3/uL (ref 0.0–0.5)
HCT: 30.3 % — ABNORMAL LOW (ref 34.8–46.6)
HEMOGLOBIN: 10.2 g/dL — AB (ref 11.6–15.9)
LYMPH#: 0.4 10*3/uL — ABNORMAL LOW (ref 0.9–3.3)
LYMPH%: 5.9 % — ABNORMAL LOW (ref 14.0–48.0)
MCH: 30.9 pg (ref 26.0–34.0)
MCHC: 33.7 g/dL (ref 32.0–36.0)
MCV: 92 fL (ref 81–101)
MONO#: 0.1 10*3/uL (ref 0.1–0.9)
MONO%: 1.9 % (ref 0.0–13.0)
NEUT%: 92.2 % — ABNORMAL HIGH (ref 39.6–80.0)
NEUTROS ABS: 6.2 10*3/uL (ref 1.5–6.5)
Platelets: 255 10*3/uL (ref 145–400)
RBC: 3.3 10*6/uL — AB (ref 3.70–5.32)
RDW: 14.6 % (ref 11.1–15.7)
WBC: 6.8 10*3/uL (ref 3.9–10.0)

## 2017-02-08 LAB — CMP (CANCER CENTER ONLY)
ALBUMIN: 3.8 g/dL (ref 3.3–5.5)
ALT(SGPT): 22 U/L (ref 10–47)
AST: 22 U/L (ref 11–38)
Alkaline Phosphatase: 52 U/L (ref 26–84)
BILIRUBIN TOTAL: 0.9 mg/dL (ref 0.20–1.60)
BUN, Bld: 22 mg/dL (ref 7–22)
CALCIUM: 9.2 mg/dL (ref 8.0–10.3)
CHLORIDE: 104 meq/L (ref 98–108)
CO2: 26 meq/L (ref 18–33)
Creat: 1.2 mg/dl (ref 0.6–1.2)
GLUCOSE: 168 mg/dL — AB (ref 73–118)
Potassium: 3.7 mEq/L (ref 3.3–4.7)
SODIUM: 147 meq/L — AB (ref 128–145)
Total Protein: 6.9 g/dL (ref 6.4–8.1)

## 2017-02-08 LAB — LACTATE DEHYDROGENASE: LDH: 222 U/L (ref 125–245)

## 2017-02-08 NOTE — Progress Notes (Signed)
Hematology and Oncology Follow Up Visit  Jeanne Martinez [id number] 1950-05-11 66 y.o. 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 She is doing well and has no complaints at this time. She has had a wonderful response so far to treatment. Her Hgb is stable at 10.3 with an MCV of 92. BUD and creatinine are stable and calcium is 9.2 with total protein of 6.9.  M-spike in October was 0.3 g/dL, IgA level was 12 mg/dL and kappa light chain was 2.1 mg/L.  No lymphadenopathy found on exam. No bleeding, bruising or petechiae.  No fever, chills, n/v, cough, rash, dizziness, SOB, chest pain, palpitations, abdominal pain or changes in bowel or bladder habits.  No swelling, tenderness, numbness or tingling in her extremities. No c/o pain.  She has had some weakness in her legs but states that she is walking daily in her neighborhood for exercise and strengthening.  She has a good appetite and is staying well hydrated. Her weight is up 1 lb since her last visit.   ECOG Performance Status: 1 - Symptomatic but completely ambulatory  Medications:  Allergies as of 02/08/2017      Reactions   Milk-related Compounds Anaphylaxis   Throat closes    No Known Allergies       Medication List        Accurate as of 02/08/17  1:34 PM. Always use your most recent med list.          dexamethasone 4 MG tablet Commonly known as:  DECADRON Take 5 pills once a week with food.   famciclovir 250 MG tablet Commonly known as:  FAMVIR Take 1 tablet (250 mg total) by mouth daily.   potassium chloride SA 20 MEQ tablet Commonly known as:  K-DUR,KLOR-CON Take 2 pills twice a day for 5 days and then one pill twice a day.       Allergies:  Allergies  Allergen Reactions  .  Milk-Related Compounds Anaphylaxis    Throat closes   . No Known Allergies     Past Medical History, Surgical history, Social history, and Family History were reviewed and updated.  Review of Systems: All other 10 point review of systems is negative.   Physical Exam:  weight is 101 lb (45.8 kg). Her oral temperature is 97.7 F (36.5 C). Her blood pressure is 135/69 and her pulse is 110 (abnormal). Her respiration is 18 and oxygen saturation is 100%.   Wt Readings from Last 3 Encounters:  02/08/17 101 lb (45.8 kg)  01/24/17 100 lb (45.4 kg)  01/24/17 101 lb (45.8 kg)    Ocular: Sclerae unicteric, pupils equal, round and reactive to light Ear-nose-throat: Oropharynx clear, dentition fair Lymphatic: No cervical, supraclavicular or axillary adenopathy Lungs no rales or rhonchi, good excursion bilaterally Heart regular rate and rhythm, no murmur appreciated Abd soft, nontender, positive bowel sounds, no liver or spleen tip palpated on exam, no fluid wave  MSK no focal spinal tenderness, no joint edema Neuro: non-focal, well-oriented, appropriate affect Breasts: Deferred   Lab Results  Component Value Date   WBC 6.8 02/08/2017   HGB 10.2 (L) 02/08/2017   HCT 30.3 (L) 02/08/2017   MCV 92 02/08/2017   PLT 255 02/08/2017   Lab Results  Component Value  Date   FERRITIN 1,412 (H) 09/30/2016   IRON 140 09/30/2016   TIBC 240 09/30/2016   UIBC 100 (L) 09/30/2016   IRONPCTSAT 59 (H) 09/30/2016   Lab Results  Component Value Date   RBC 3.30 (L) 02/08/2017   Lab Results  Component Value Date   KAPLAMBRATIO 2.62 (H) 01/24/2017   Lab Results  Component Value Date   IGGSERUM 290 (L) 01/24/2017   IGMSERUM 9 (L) 01/24/2017   Lab Results  Component Value Date   MSPIKE Not Observed 01/24/2017     Chemistry      Component Value Date/Time   NA 148 (H) 01/24/2017 1301   K 4.4 01/24/2017 1301   CL 104 01/24/2017 1301   CO2 25 01/24/2017 1301   BUN 23 (H) 01/24/2017 1301    CREATININE 0.9 01/24/2017 1301      Component Value Date/Time   CALCIUM 8.0 01/24/2017 1301   ALKPHOS 49 01/24/2017 1301   AST 25 01/24/2017 1301   ALT 21 01/24/2017 1301   BILITOT 0.60 01/24/2017 1301      Impression and Plan: Jeanne Martinez is a very pleasant 66 yo caucasian female with IgA kappa myeloma with 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 She has responded nicely to treatment and is doing quite well.  I spoke with Dr. Marin Olp and we will give her 2 weeks off for Thanksgiving so she can go up to [facility] and see her family. No treatment today, we will resume on 11/27.  She is in agreement with the plan and will contact our office with any questions or concerns. We can certainly see her sooner if need be.  Eliezer Bottom, NP 11/14/20181:34 PM

## 2017-02-09 LAB — IGG, IGA, IGM
IGM (IMMUNOGLOBIN M), SRM: 8 mg/dL — AB (ref 26–217)
IgA, Qn, Serum: 11 mg/dL — ABNORMAL LOW (ref 87–352)
IgG, Qn, Serum: 278 mg/dL — ABNORMAL LOW (ref 700–1600)

## 2017-02-09 LAB — KAPPA/LAMBDA LIGHT CHAINS
Ig Kappa Free Light Chain: 9.6 mg/L (ref 3.3–19.4)
Ig Lambda Free Light Chain: 2 mg/L — ABNORMAL LOW (ref 5.7–26.3)
Kappa/Lambda FluidC Ratio: 4.8 — ABNORMAL HIGH (ref 0.26–1.65)

## 2017-02-10 ENCOUNTER — Encounter (HOSPITAL_COMMUNITY): Payer: Self-pay

## 2017-02-10 LAB — PROTEIN ELECTROPHORESIS, SERUM, WITH REFLEX
A/G Ratio: 2.2 — ABNORMAL HIGH (ref 0.7–1.7)
ALPHA 1: 0.2 g/dL (ref 0.0–0.4)
Albumin: 4.3 g/dL (ref 2.9–4.4)
Alpha 2: 0.7 g/dL (ref 0.4–1.0)
Beta: 0.9 g/dL (ref 0.7–1.3)
GAMMA GLOBULIN: 0.2 g/dL — AB (ref 0.4–1.8)
Globulin, Total: 2 g/dL — ABNORMAL LOW (ref 2.2–3.9)
TOTAL PROTEIN: 6.3 g/dL (ref 6.0–8.5)

## 2017-02-10 LAB — TISSUE HYBRIDIZATION (BONE MARROW)-NCBH

## 2017-02-10 LAB — CHROMOSOME ANALYSIS, BONE MARROW

## 2017-02-21 ENCOUNTER — Other Ambulatory Visit (HOSPITAL_BASED_OUTPATIENT_CLINIC_OR_DEPARTMENT_OTHER): Payer: 59

## 2017-02-21 ENCOUNTER — Ambulatory Visit (HOSPITAL_BASED_OUTPATIENT_CLINIC_OR_DEPARTMENT_OTHER): Payer: 59 | Admitting: Hematology & Oncology

## 2017-02-21 ENCOUNTER — Other Ambulatory Visit: Payer: Self-pay

## 2017-02-21 ENCOUNTER — Encounter: Payer: Self-pay | Admitting: Hematology & Oncology

## 2017-02-21 ENCOUNTER — Ambulatory Visit (HOSPITAL_BASED_OUTPATIENT_CLINIC_OR_DEPARTMENT_OTHER): Payer: 59

## 2017-02-21 VITALS — BP 120/71 | HR 78 | Temp 98.3°F | Resp 17 | Wt 99.8 lb

## 2017-02-21 DIAGNOSIS — G629 Polyneuropathy, unspecified: Secondary | ICD-10-CM | POA: Diagnosis not present

## 2017-02-21 DIAGNOSIS — C9000 Multiple myeloma not having achieved remission: Secondary | ICD-10-CM

## 2017-02-21 DIAGNOSIS — Z5112 Encounter for antineoplastic immunotherapy: Secondary | ICD-10-CM

## 2017-02-21 DIAGNOSIS — Z5111 Encounter for antineoplastic chemotherapy: Secondary | ICD-10-CM

## 2017-02-21 DIAGNOSIS — N179 Acute kidney failure, unspecified: Secondary | ICD-10-CM | POA: Diagnosis not present

## 2017-02-21 LAB — CBC WITH DIFFERENTIAL (CANCER CENTER ONLY)
BASO#: 0 10*3/uL (ref 0.0–0.2)
BASO%: 0.3 % (ref 0.0–2.0)
EOS ABS: 0.1 10*3/uL (ref 0.0–0.5)
EOS%: 0.7 % (ref 0.0–7.0)
HCT: 30.4 % — ABNORMAL LOW (ref 34.8–46.6)
HGB: 10 g/dL — ABNORMAL LOW (ref 11.6–15.9)
LYMPH#: 0.7 10*3/uL — ABNORMAL LOW (ref 0.9–3.3)
LYMPH%: 6.8 % — AB (ref 14.0–48.0)
MCH: 30.4 pg (ref 26.0–34.0)
MCHC: 32.9 g/dL (ref 32.0–36.0)
MCV: 92 fL (ref 81–101)
MONO#: 0.7 10*3/uL (ref 0.1–0.9)
MONO%: 6.8 % (ref 0.0–13.0)
NEUT#: 8.3 10*3/uL — ABNORMAL HIGH (ref 1.5–6.5)
NEUT%: 85.4 % — ABNORMAL HIGH (ref 39.6–80.0)
PLATELETS: 308 10*3/uL (ref 145–400)
RBC: 3.29 10*6/uL — AB (ref 3.70–5.32)
RDW: 14.2 % (ref 11.1–15.7)
WBC: 9.7 10*3/uL (ref 3.9–10.0)

## 2017-02-21 LAB — CMP (CANCER CENTER ONLY)
ALT(SGPT): 18 U/L (ref 10–47)
AST: 21 U/L (ref 11–38)
Albumin: 3.5 g/dL (ref 3.3–5.5)
Alkaline Phosphatase: 68 U/L (ref 26–84)
BUN: 22 mg/dL (ref 7–22)
CHLORIDE: 106 meq/L (ref 98–108)
CO2: 26 meq/L (ref 18–33)
CREATININE: 1.2 mg/dL (ref 0.6–1.2)
Calcium: 9 mg/dL (ref 8.0–10.3)
GLUCOSE: 112 mg/dL (ref 73–118)
Potassium: 3.4 mEq/L (ref 3.3–4.7)
SODIUM: 143 meq/L (ref 128–145)
TOTAL PROTEIN: 7.2 g/dL (ref 6.4–8.1)
Total Bilirubin: 0.7 mg/dl (ref 0.20–1.60)

## 2017-02-21 LAB — LACTATE DEHYDROGENASE: LDH: 191 U/L (ref 125–245)

## 2017-02-21 MED ORDER — PALONOSETRON HCL INJECTION 0.25 MG/5ML
0.2500 mg | Freq: Once | INTRAVENOUS | Status: AC
  Administered 2017-02-21: 0.25 mg via INTRAVENOUS

## 2017-02-21 MED ORDER — BORTEZOMIB CHEMO SQ INJECTION 3.5 MG (2.5MG/ML)
1.3000 mg/m2 | Freq: Once | INTRAMUSCULAR | Status: AC
  Administered 2017-02-21: 1.75 mg via SUBCUTANEOUS
  Filled 2017-02-21: qty 1.75

## 2017-02-21 MED ORDER — PALONOSETRON HCL INJECTION 0.25 MG/5ML
INTRAVENOUS | Status: AC
  Filled 2017-02-21: qty 5

## 2017-02-21 MED ORDER — SODIUM CHLORIDE 0.9 % IV SOLN
400.0000 mg/m2 | Freq: Once | INTRAVENOUS | Status: AC
  Administered 2017-02-21: 540 mg via INTRAVENOUS
  Filled 2017-02-21: qty 27

## 2017-02-21 NOTE — Progress Notes (Signed)
Hematology and Oncology Follow Up Visit  Jeanne Martinez [id number] 10/21/1950 66 y.o. 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  She comes in with her husband.  Two of her brothers came in.  One from [place] and one from [place].  She has done incredibly well.  Her last myeloma studies did not show a monoclonal spike.  Her IgA level was down to 11 mg/dL.  Her kappa light chain was 1 mg/dL.  She had a very nice Thanksgiving.  She actually is going up to [facility] today.  She has had some tingling in the fingers of her left hand.  This is just in the tips.  I am not sure if this is from her chemotherapy.  She has had no problems with nausea or vomiting.  She has had no change in bowel or bladder habits.  There is been no diarrhea.  She is urinating well.  She has had no leg swelling.  There is been no rashes.  She had no cough.  There is no shortness of breath.  There is been no mouth sores.  She has had no headache.  She has had no bleeding.   ECOG Performance Status: 1 - Symptomatic but completely ambulatory  Medications:  Allergies as of 02/21/2017      Reactions   Milk-related Compounds Anaphylaxis   Throat closes    No Known Allergies       Medication List        Accurate as of 02/21/17 12:12 PM. Always use your most recent med list.          dexamethasone 4 MG tablet Commonly known as:  DECADRON Take 5 pills once a week with food.   famciclovir 250 MG tablet Commonly known as:  FAMVIR Take 1 tablet (250 mg total) by mouth daily.   potassium chloride SA 20 MEQ tablet Commonly known as:  K-DUR,KLOR-CON Take 20 mEq by mouth every other day.       Allergies:  Allergies  Allergen Reactions  . Milk-Related Compounds Anaphylaxis    Throat  closes   . No Known Allergies     Past Medical History, Surgical history, Social history, and Family History were reviewed and updated.  Review of Systems: As stated in the interim history  Physical Exam:  weight is 99 lb 12.8 oz (45.3 kg). Her oral temperature is 98.3 F (36.8 C). Her blood pressure is 120/71 and her pulse is 78. Her respiration is 17 and oxygen saturation is 100%.   Wt Readings from Last 3 Encounters:  02/21/17 99 lb 12.8 oz (45.3 kg)  02/08/17 101 lb (45.8 kg)  01/24/17 100 lb (45.4 kg)    Physical Exam  Constitutional: She is oriented to person, place, and time.  HENT:  Head: Normocephalic and atraumatic.  Mouth/Throat: Oropharynx is clear and moist.  Eyes: EOM are normal. Pupils are equal, round, and reactive to light.  Neck: Normal range of motion.  Cardiovascular: Normal rate, regular rhythm and normal heart sounds.  Pulmonary/Chest: Effort normal and breath sounds normal.  Abdominal: Soft. Bowel sounds are normal.  Musculoskeletal: Normal range of motion. She exhibits no edema, tenderness or deformity.  Lymphadenopathy:    She has no cervical adenopathy.  Neurological: She is alert and oriented  to person, place, and time.  Skin: Skin is warm and dry. No rash noted. No erythema.  Psychiatric: She has a normal mood and affect. Her behavior is normal. Judgment and thought content normal.  Vitals reviewed.    Lab Results  Component Value Date   WBC 9.7 02/21/2017   HGB 10.0 (L) 02/21/2017   HCT 30.4 (L) 02/21/2017   MCV 92 02/21/2017   PLT 308 02/21/2017   Lab Results  Component Value Date   FERRITIN 1,412 (H) 09/30/2016   IRON 140 09/30/2016   TIBC 240 09/30/2016   UIBC 100 (L) 09/30/2016   IRONPCTSAT 59 (H) 09/30/2016   Lab Results  Component Value Date   RBC 3.29 (L) 02/21/2017   Lab Results  Component Value Date   KAPLAMBRATIO 4.80 (H) 02/08/2017   Lab Results  Component Value Date   IGGSERUM 278 (L) 02/08/2017   IGMSERUM 8  (L) 02/08/2017   Lab Results  Component Value Date   MSPIKE Not Observed 02/08/2017     Chemistry      Component Value Date/Time   NA 143 02/21/2017 1118   K 3.4 02/21/2017 1118   CL 106 02/21/2017 1118   CO2 26 02/21/2017 1118   BUN 22 02/21/2017 1118   CREATININE 1.2 02/21/2017 1118      Component Value Date/Time   CALCIUM 9.0 02/21/2017 1118   ALKPHOS 68 02/21/2017 1118   AST 21 02/21/2017 1118   ALT 18 02/21/2017 1118   BILITOT 0.70 02/21/2017 1118      Impression and Plan: Jeanne Martinez is a very pleasant 66 yo caucasian female with IgA kappa myeloma with 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 She has responded nicely to treatment and is doing quite well.   Again, I think because her response is been so good, we can move her treatments out to every 3 weeks.  This will make things little bit easier for her.  She can now get through the Christmas holidays.  I will plan to get her back in 3 weeks.  I do not see a need for any scans.  I did tell her to make sure she takes her potassium daily.  I also recommended that she take some extra magnesium.  This may help with the neuropathy.   Volanda Napoleon, MD 11/27/201812:12 PM

## 2017-02-21 NOTE — Patient Instructions (Signed)
[facility] Discharge Instructions for Patients Receiving Chemotherapy  Today you received the following chemotherapy agents Cytoxan/Velcade   To help prevent nausea and vomiting after your treatment, we encourage you to take your nausea medication as prescribed.   If you develop nausea and vomiting that is not controlled by your nausea medication, call the clinic.   BELOW ARE SYMPTOMS THAT SHOULD BE REPORTED IMMEDIATELY:  *FEVER GREATER THAN 100.5 F  *CHILLS WITH OR WITHOUT FEVER  NAUSEA AND VOMITING THAT IS NOT CONTROLLED WITH YOUR NAUSEA MEDICATION  *UNUSUAL SHORTNESS OF BREATH  *UNUSUAL BRUISING OR BLEEDING  TENDERNESS IN MOUTH AND THROAT WITH OR WITHOUT PRESENCE OF ULCERS  *URINARY PROBLEMS  *BOWEL PROBLEMS  UNUSUAL RASH Items with * indicate a potential emergency and should be followed up as soon as possible.  Feel free to call the clinic should you have any questions or concerns. The clinic phone number is [phone].  Please show the CHEMO ALERT CARD at check-in to the Emergency Department and triage nurse.   

## 2017-03-07 ENCOUNTER — Ambulatory Visit: Payer: No Typology Code available for payment source

## 2017-03-07 ENCOUNTER — Ambulatory Visit: Payer: No Typology Code available for payment source | Admitting: Hematology & Oncology

## 2017-03-07 ENCOUNTER — Other Ambulatory Visit: Payer: No Typology Code available for payment source

## 2017-03-08 ENCOUNTER — Other Ambulatory Visit: Payer: No Typology Code available for payment source

## 2017-03-08 ENCOUNTER — Ambulatory Visit: Payer: No Typology Code available for payment source | Admitting: Hematology & Oncology

## 2017-03-08 ENCOUNTER — Ambulatory Visit: Payer: Medicare (Managed Care)

## 2017-03-14 ENCOUNTER — Ambulatory Visit: Payer: Self-pay | Admitting: Hematology & Oncology

## 2017-03-14 ENCOUNTER — Ambulatory Visit: Payer: Medicare (Managed Care)

## 2017-03-14 ENCOUNTER — Other Ambulatory Visit: Payer: Medicare (Managed Care)

## 2017-03-17 ENCOUNTER — Ambulatory Visit (HOSPITAL_BASED_OUTPATIENT_CLINIC_OR_DEPARTMENT_OTHER): Payer: Medicare (Managed Care) | Admitting: Hematology & Oncology

## 2017-03-17 ENCOUNTER — Other Ambulatory Visit: Payer: Self-pay

## 2017-03-17 ENCOUNTER — Other Ambulatory Visit (HOSPITAL_BASED_OUTPATIENT_CLINIC_OR_DEPARTMENT_OTHER): Payer: Medicare (Managed Care)

## 2017-03-17 ENCOUNTER — Ambulatory Visit (HOSPITAL_BASED_OUTPATIENT_CLINIC_OR_DEPARTMENT_OTHER): Payer: Medicare (Managed Care)

## 2017-03-17 ENCOUNTER — Encounter: Payer: Self-pay | Admitting: Hematology & Oncology

## 2017-03-17 VITALS — BP 133/81 | HR 85 | Temp 97.8°F | Resp 16 | Wt 103.0 lb

## 2017-03-17 DIAGNOSIS — C9000 Multiple myeloma not having achieved remission: Secondary | ICD-10-CM

## 2017-03-17 DIAGNOSIS — Z5111 Encounter for antineoplastic chemotherapy: Secondary | ICD-10-CM | POA: Diagnosis not present

## 2017-03-17 DIAGNOSIS — N179 Acute kidney failure, unspecified: Secondary | ICD-10-CM

## 2017-03-17 DIAGNOSIS — Z5112 Encounter for antineoplastic immunotherapy: Secondary | ICD-10-CM | POA: Diagnosis not present

## 2017-03-17 LAB — CBC WITH DIFFERENTIAL (CANCER CENTER ONLY)
BASO#: 0 10*3/uL (ref 0.0–0.2)
BASO%: 0.3 % (ref 0.0–2.0)
EOS ABS: 0.2 10*3/uL (ref 0.0–0.5)
EOS%: 2.3 % (ref 0.0–7.0)
HCT: 29.6 % — ABNORMAL LOW (ref 34.8–46.6)
HEMOGLOBIN: 9.5 g/dL — AB (ref 11.6–15.9)
LYMPH#: 0.8 10*3/uL — ABNORMAL LOW (ref 0.9–3.3)
LYMPH%: 11.2 % — AB (ref 14.0–48.0)
MCH: 31.5 pg (ref 26.0–34.0)
MCHC: 32.1 g/dL (ref 32.0–36.0)
MCV: 98 fL (ref 81–101)
MONO#: 0.7 10*3/uL (ref 0.1–0.9)
MONO%: 10.2 % (ref 0.0–13.0)
NEUT#: 5.5 10*3/uL (ref 1.5–6.5)
NEUT%: 76 % (ref 39.6–80.0)
PLATELETS: 240 10*3/uL (ref 145–400)
RBC: 3.02 10*6/uL — ABNORMAL LOW (ref 3.70–5.32)
RDW: 13.5 % (ref 11.1–15.7)
WBC: 7.3 10*3/uL (ref 3.9–10.0)

## 2017-03-17 LAB — CMP (CANCER CENTER ONLY)
ALBUMIN: 3.6 g/dL (ref 3.3–5.5)
ALT(SGPT): 21 U/L (ref 10–47)
AST: 21 U/L (ref 11–38)
Alkaline Phosphatase: 52 U/L (ref 26–84)
BUN, Bld: 23 mg/dL — ABNORMAL HIGH (ref 7–22)
CHLORIDE: 106 meq/L (ref 98–108)
CO2: 27 meq/L (ref 18–33)
CREATININE: 1.1 mg/dL (ref 0.6–1.2)
Calcium: 9.1 mg/dL (ref 8.0–10.3)
Glucose, Bld: 76 mg/dL (ref 73–118)
Potassium: 4.4 mEq/L (ref 3.3–4.7)
SODIUM: 148 meq/L — AB (ref 128–145)
Total Bilirubin: 0.6 mg/dl (ref 0.20–1.60)
Total Protein: 6.3 g/dL — ABNORMAL LOW (ref 6.4–8.1)

## 2017-03-17 LAB — FERRITIN: FERRITIN: 220 ng/mL (ref 9–269)

## 2017-03-17 LAB — IRON AND TIBC
%SAT: 27 % (ref 21–57)
Iron: 82 ug/dL (ref 41–142)
TIBC: 303 ug/dL (ref 236–444)
UIBC: 221 ug/dL (ref 120–384)

## 2017-03-17 MED ORDER — SODIUM CHLORIDE 0.9 % IV SOLN
400.0000 mg/m2 | Freq: Once | INTRAVENOUS | Status: AC
  Administered 2017-03-17: 540 mg via INTRAVENOUS
  Filled 2017-03-17: qty 27

## 2017-03-17 MED ORDER — PALONOSETRON HCL INJECTION 0.25 MG/5ML
INTRAVENOUS | Status: AC
  Filled 2017-03-17: qty 5

## 2017-03-17 MED ORDER — POTASSIUM CHLORIDE CRYS ER 20 MEQ PO TBCR: 20.0000 meq | EXTENDED_RELEASE_TABLET | ORAL | 4 refills | Status: DC

## 2017-03-17 MED ORDER — BORTEZOMIB CHEMO SQ INJECTION 3.5 MG (2.5MG/ML)
1.3000 mg/m2 | Freq: Once | INTRAMUSCULAR | Status: AC
  Administered 2017-03-17: 1.75 mg via SUBCUTANEOUS
  Filled 2017-03-17: qty 1.75

## 2017-03-17 MED ORDER — PALONOSETRON HCL INJECTION 0.25 MG/5ML
0.2500 mg | Freq: Once | INTRAVENOUS | Status: AC
  Administered 2017-03-17: 0.25 mg via INTRAVENOUS

## 2017-03-17 MED ORDER — DENOSUMAB 120 MG/1.7ML ~~LOC~~ SOLN
SUBCUTANEOUS | Status: AC
  Filled 2017-03-17: qty 1.7

## 2017-03-17 MED ORDER — DENOSUMAB 120 MG/1.7ML ~~LOC~~ SOLN
120.0000 mg | Freq: Once | SUBCUTANEOUS | Status: AC
  Administered 2017-03-17: 120 mg via SUBCUTANEOUS

## 2017-03-17 NOTE — Patient Instructions (Signed)
Denosumab injection What is this medicine? DENOSUMAB (den oh sue mab) slows bone breakdown. Prolia is used to treat osteoporosis in women after menopause and in men. Delton See is used to treat a high calcium level due to cancer and to prevent bone fractures and other bone problems caused by multiple myeloma or cancer bone metastases. Delton See is also used to treat giant cell tumor of the bone. This medicine may be used for other purposes; ask your health care provider or pharmacist if you have questions. COMMON BRAND NAME(S): Prolia, XGEVA What should I tell my health care provider before I take this medicine? They need to know if you have any of these conditions: -dental disease -having surgery or tooth extraction -infection -kidney disease -low levels of calcium or Vitamin D in the blood -malnutrition -on hemodialysis -skin conditions or sensitivity -thyroid or parathyroid disease -an unusual reaction to denosumab, other medicines, foods, dyes, or preservatives -pregnant or trying to get pregnant -breast-feeding How should I use this medicine? This medicine is for injection under the skin. It is given by a health care professional in a hospital or clinic setting. If you are getting Prolia, a special MedGuide will be given to you by the pharmacist with each prescription and refill. Be sure to read this information carefully each time. For Prolia, talk to your pediatrician regarding the use of this medicine in children. Special care may be needed. For Delton See, talk to your pediatrician regarding the use of this medicine in children. While this drug may be prescribed for children as young as 13 years for selected conditions, precautions do apply. Overdosage: If you think you have taken too much of this medicine contact a poison control center or emergency room at once. NOTE: This medicine is only for you. Do not share this medicine with others. What if I miss a dose? It is important not to miss your  dose. Call your doctor or health care professional if you are unable to keep an appointment. What may interact with this medicine? Do not take this medicine with any of the following medications: -other medicines containing denosumab This medicine may also interact with the following medications: -medicines that lower your chance of fighting infection -steroid medicines like prednisone or cortisone This list may not describe all possible interactions. Give your health care provider a list of all the medicines, herbs, non-prescription drugs, or dietary supplements you use. Also tell them if you smoke, drink alcohol, or use illegal drugs. Some items may interact with your medicine. What should I watch for while using this medicine? Visit your doctor or health care professional for regular checks on your progress. Your doctor or health care professional may order blood tests and other tests to see how you are doing. Call your doctor or health care professional for advice if you get a fever, chills or sore throat, or other symptoms of a cold or flu. Do not treat yourself. This drug may decrease your body's ability to fight infection. Try to avoid being around people who are sick. You should make sure you get enough calcium and vitamin D while you are taking this medicine, unless your doctor tells you not to. Discuss the foods you eat and the vitamins you take with your health care professional. See your dentist regularly. Brush and floss your teeth as directed. Before you have any dental work done, tell your dentist you are receiving this medicine. Do not become pregnant while taking this medicine or for 5 months after stopping  it. Talk with your doctor or health care professional about your birth control options while taking this medicine. Women should inform their doctor if they wish to become pregnant or think they might be pregnant. There is a potential for serious side effects to an unborn child. Talk  to your health care professional or pharmacist for more information. What side effects may I notice from receiving this medicine? Side effects that you should report to your doctor or health care professional as soon as possible: -allergic reactions like skin rash, itching or hives, swelling of the face, lips, or tongue -bone pain -breathing problems -dizziness -jaw pain, especially after dental work -redness, blistering, peeling of the skin -signs and symptoms of infection like fever or chills; cough; sore throat; pain or trouble passing urine -signs of low calcium like fast heartbeat, muscle cramps or muscle pain; pain, tingling, numbness in the hands or feet; seizures -unusual bleeding or bruising -unusually weak or tired Side effects that usually do not require medical attention (report to your doctor or health care professional if they continue or are bothersome): -constipation -diarrhea -headache -joint pain -loss of appetite -muscle pain -runny nose -tiredness -upset stomach This list may not describe all possible side effects. Call your doctor for medical advice about side effects. You may report side effects to FDA at 1-800-FDA-1088. Where should I keep my medicine? This medicine is only given in a clinic, doctor's office, or other health care setting and will not be stored at home. NOTE: This sheet is a summary. It may not cover all possible information. If you have questions about this medicine, talk to your doctor, pharmacist, or health care provider.  2018 Elsevier/Gold Standard (2016-04-05 19:17:21) Cyclophosphamide injection What is this medicine? CYCLOPHOSPHAMIDE (sye kloe FOSS fa mide) is a chemotherapy drug. It slows the growth of cancer cells. This medicine is used to treat many types of cancer like lymphoma, myeloma, leukemia, breast cancer, and ovarian cancer, to name a few. This medicine may be used for other purposes; ask your health care provider or pharmacist  if you have questions. COMMON BRAND NAME(S): Cytoxan, Neosar What should I tell my health care provider before I take this medicine? They need to know if you have any of these conditions: -blood disorders -history of other chemotherapy -infection -kidney disease -liver disease -recent or ongoing radiation therapy -tumors in the bone marrow -an unusual or allergic reaction to cyclophosphamide, other chemotherapy, other medicines, foods, dyes, or preservatives -pregnant or trying to get pregnant -breast-feeding How should I use this medicine? This drug is usually given as an injection into a vein or muscle or by infusion into a vein. It is administered in a hospital or clinic by a specially trained health care professional. Talk to your pediatrician regarding the use of this medicine in children. Special care may be needed. Overdosage: If you think you have taken too much of this medicine contact a poison control center or emergency room at once. NOTE: This medicine is only for you. Do not share this medicine with others. What if I miss a dose? It is important not to miss your dose. Call your doctor or health care professional if you are unable to keep an appointment. What may interact with this medicine? This medicine may interact with the following medications: -amiodarone -amphotericin B -azathioprine -certain antiviral medicines for HIV or AIDS such as protease inhibitors (e.g., indinavir, ritonavir) and zidovudine -certain blood pressure medications such as benazepril, captopril, enalapril, fosinopril, lisinopril, moexipril, monopril, perindopril,  quinapril, ramipril, trandolapril -certain cancer medications such as anthracyclines (e.g., daunorubicin, doxorubicin), busulfan, cytarabine, paclitaxel, pentostatin, tamoxifen, trastuzumab -certain diuretics such as chlorothiazide, chlorthalidone, hydrochlorothiazide, indapamide, metolazone -certain medicines that treat or prevent blood  clots like warfarin -certain muscle relaxants such as succinylcholine -cyclosporine -etanercept -indomethacin -medicines to increase blood counts like filgrastim, pegfilgrastim, sargramostim -medicines used as general anesthesia -metronidazole -natalizumab This list may not describe all possible interactions. Give your health care provider a list of all the medicines, herbs, non-prescription drugs, or dietary supplements you use. Also tell them if you smoke, drink alcohol, or use illegal drugs. Some items may interact with your medicine. What should I watch for while using this medicine? Visit your doctor for checks on your progress. This drug may make you feel generally unwell. This is not uncommon, as chemotherapy can affect healthy cells as well as cancer cells. Report any side effects. Continue your course of treatment even though you feel ill unless your doctor tells you to stop. Drink water or other fluids as directed. Urinate often, even at night. In some cases, you may be given additional medicines to help with side effects. Follow all directions for their use. Call your doctor or health care professional for advice if you get a fever, chills or sore throat, or other symptoms of a cold or flu. Do not treat yourself. This drug decreases your body's ability to fight infections. Try to avoid being around people who are sick. This medicine may increase your risk to bruise or bleed. Call your doctor or health care professional if you notice any unusual bleeding. Be careful brushing and flossing your teeth or using a toothpick because you may get an infection or bleed more easily. If you have any dental work done, tell your dentist you are receiving this medicine. You may get drowsy or dizzy. Do not drive, use machinery, or do anything that needs mental alertness until you know how this medicine affects you. Do not become pregnant while taking this medicine or for 1 year after stopping it. Women  should inform their doctor if they wish to become pregnant or think they might be pregnant. Men should not father a child while taking this medicine and for 4 months after stopping it. There is a potential for serious side effects to an unborn child. Talk to your health care professional or pharmacist for more information. Do not breast-feed an infant while taking this medicine. This medicine may interfere with the ability to have a child. This medicine has caused ovarian failure in some women. This medicine has caused reduced sperm counts in some men. You should talk with your doctor or health care professional if you are concerned about your fertility. If you are going to have surgery, tell your doctor or health care professional that you have taken this medicine. What side effects may I notice from receiving this medicine? Side effects that you should report to your doctor or health care professional as soon as possible: -allergic reactions like skin rash, itching or hives, swelling of the face, lips, or tongue -low blood counts - this medicine may decrease the number of white blood cells, red blood cells and platelets. You may be at increased risk for infections and bleeding. -signs of infection - fever or chills, cough, sore throat, pain or difficulty passing urine -signs of decreased platelets or bleeding - bruising, pinpoint red spots on the skin, black, tarry stools, blood in the urine -signs of decreased red blood cells - unusually weak  or tired, fainting spells, lightheadedness -breathing problems -dark urine -dizziness -palpitations -swelling of the ankles, feet, hands -trouble passing urine or change in the amount of urine -weight gain -yellowing of the eyes or skin Side effects that usually do not require medical attention (report to your doctor or health care professional if they continue or are bothersome): -changes in nail or skin color -hair loss -missed menstrual  periods -mouth sores -nausea, vomiting This list may not describe all possible side effects. Call your doctor for medical advice about side effects. You may report side effects to FDA at 1-800-FDA-1088. Where should I keep my medicine? This drug is given in a hospital or clinic and will not be stored at home. NOTE: This sheet is a summary. It may not cover all possible information. If you have questions about this medicine, talk to your doctor, pharmacist, or health care provider.  2018 Elsevier/Gold Standard (2012-01-27 16:22:58) Bortezomib injection What is this medicine? BORTEZOMIB (bor TEZ oh mib) is a medicine that targets proteins in cancer cells and stops the cancer cells from growing. It is used to treat multiple myeloma and mantle-cell lymphoma. This medicine may be used for other purposes; ask your health care provider or pharmacist if you have questions. COMMON BRAND NAME(S): Velcade What should I tell my health care provider before I take this medicine? They need to know if you have any of these conditions: -diabetes -heart disease -irregular heartbeat -liver disease -on hemodialysis -low blood counts, like low white blood cells, platelets, or hemoglobin -peripheral neuropathy -taking medicine for blood pressure -an unusual or allergic reaction to bortezomib, mannitol, boron, other medicines, foods, dyes, or preservatives -pregnant or trying to get pregnant -breast-feeding How should I use this medicine? This medicine is for injection into a vein or for injection under the skin. It is given by a health care professional in a hospital or clinic setting. Talk to your pediatrician regarding the use of this medicine in children. Special care may be needed. Overdosage: If you think you have taken too much of this medicine contact a poison control center or emergency room at once. NOTE: This medicine is only for you. Do not share this medicine with others. What if I miss a  dose? It is important not to miss your dose. Call your doctor or health care professional if you are unable to keep an appointment. What may interact with this medicine? This medicine may interact with the following medications: -ketoconazole -rifampin -ritonavir -St. John's Wort This list may not describe all possible interactions. Give your health care provider a list of all the medicines, herbs, non-prescription drugs, or dietary supplements you use. Also tell them if you smoke, drink alcohol, or use illegal drugs. Some items may interact with your medicine. What should I watch for while using this medicine? You may get drowsy or dizzy. Do not drive, use machinery, or do anything that needs mental alertness until you know how this medicine affects you. Do not stand or sit up quickly, especially if you are an older patient. This reduces the risk of dizzy or fainting spells. In some cases, you may be given additional medicines to help with side effects. Follow all directions for their use. Call your doctor or health care professional for advice if you get a fever, chills or sore throat, or other symptoms of a cold or flu. Do not treat yourself. This drug decreases your body's ability to fight infections. Try to avoid being around people who are sick.  This medicine may increase your risk to bruise or bleed. Call your doctor or health care professional if you notice any unusual bleeding. You may need blood work done while you are taking this medicine. In some patients, this medicine may cause a serious brain infection that may cause death. If you have any problems seeing, thinking, speaking, walking, or standing, tell your doctor right away. If you cannot reach your doctor, urgently seek other source of medical care. Check with your doctor or health care professional if you get an attack of severe diarrhea, nausea and vomiting, or if you sweat a lot. The loss of too much body fluid can make it  dangerous for you to take this medicine. Do not become pregnant while taking this medicine or for at least 2 months after stopping it. Women should inform their doctor if they wish to become pregnant or think they might be pregnant. Men should not father a child while taking this medicine and for at least 2 months after stopping it. There is a potential for serious side effects to an unborn child. Talk to your health care professional or pharmacist for more information. Do not breast-feed an infant while taking this medicine or for 2 months after stopping it. This medicine may interfere with the ability to have a child. You should talk with your doctor or health care professional if you are concerned about your fertility. What side effects may I notice from receiving this medicine? Side effects that you should report to your doctor or health care professional as soon as possible: -allergic reactions like skin rash, itching or hives, swelling of the face, lips, or tongue -breathing problems -changes in hearing -changes in vision -fast, irregular heartbeat -feeling faint or lightheaded, falls -pain, tingling, numbness in the hands or feet -right upper belly pain -seizures -swelling of the ankles, feet, hands -unusual bleeding or bruising -unusually weak or tired -vomiting -yellowing of the eyes or skin Side effects that usually do not require medical attention (report to your doctor or health care professional if they continue or are bothersome): -changes in emotions or moods -constipation -diarrhea -loss of appetite -headache -irritation at site where injected -nausea This list may not describe all possible side effects. Call your doctor for medical advice about side effects. You may report side effects to FDA at 1-800-FDA-1088. Where should I keep my medicine? This drug is given in a hospital or clinic and will not be stored at home. NOTE: This sheet is a summary. It may not cover all  possible information. If you have questions about this medicine, talk to your doctor, pharmacist, or health care provider.  2018 Elsevier/Gold Standard (2016-02-11 15:53:51)

## 2017-03-18 LAB — ERYTHROPOIETIN: Erythropoietin: 10 m[IU]/mL (ref 2.6–18.5)

## 2017-03-18 LAB — IGG, IGA, IGM
IGA/IMMUNOGLOBULIN A, SERUM: 20 mg/dL — AB (ref 87–352)
IGG (IMMUNOGLOBIN G), SERUM: 336 mg/dL — AB (ref 700–1600)
IgM, Qn, Serum: 12 mg/dL — ABNORMAL LOW (ref 26–217)

## 2017-03-20 MED ORDER — IXAZOMIB CITRATE 4 MG PO CAPS: ORAL_CAPSULE | ORAL | 6 refills | Status: DC

## 2017-03-20 MED ORDER — POMALIDOMIDE 3 MG PO CAPS: 3.0000 mg | ORAL_CAPSULE | Freq: Every day | ORAL | 4 refills | Status: DC

## 2017-03-20 NOTE — Progress Notes (Signed)
Hematology and Oncology Follow Up Visit  Jeanne Martinez [id number] Aug 12, 1950 66 y.o. 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  She comes in with her husband.  She is doing well.  She really would like to spend more time in [place].  This is very important for her.  Particularly with the winter months coming, she will not want to drive in any bad weather.  We last saw her in November, there is no monoclonal spike in her blood.  Her kappa light chain was 1 mg/dL.  Her IgA level was 11 mg/dL.  She has had no issues with her kidneys.  We first saw her back in the summer, she was in renal failure.  She has had no nausea or vomiting.  She has had no cough.  She has had no change in bowel or bladder habits.  She had no bleeding.  She has had no leg swelling.  She is headache..  She has lost a little bit of hair..  She does have tingling only on one hand.  I think this might be more neurologic and more anatomic than from treatment.  She will be down here for Christmas.  Overall, her performance status is ECOG 1.  Medications:  Allergies as of 03/17/2017      Reactions   Milk-related Compounds Anaphylaxis   Throat closes    No Known Allergies       Medication List        Accurate as of 03/17/17 11:59 PM. Always use your most recent med list.          dexamethasone 4 MG tablet Commonly known as:  DECADRON Take 5 pills once a week with food.   famciclovir 250 MG tablet Commonly known as:  FAMVIR Take 1 tablet (250 mg total) by mouth daily.   magnesium oxide 400 MG tablet Commonly known as:  MAG-OX Take 400 mg by mouth daily.   potassium chloride SA 20 MEQ tablet Commonly known as:  K-DUR,KLOR-CON Take 1 tablet (20 mEq total) by mouth every other day.         Allergies:  Allergies  Allergen Reactions  . Milk-Related Compounds Anaphylaxis    Throat closes   . No Known Allergies     Past Medical History, Surgical history, Social history, and Family History were reviewed and updated.  Review of Systems: As stated in the interim history  Physical Exam:  weight is 103 lb (46.7 kg). Her oral temperature is 97.8 F (36.6 C). Her blood pressure is 133/81 and her pulse is 85. Her respiration is 16 and oxygen saturation is 100%.   Wt Readings from Last 3 Encounters:  03/17/17 103 lb (46.7 kg)  02/21/17 99 lb 12.8 oz (45.3 kg)  02/08/17 101 lb (45.8 kg)    Physical Exam  Constitutional: She is oriented to person, place, and time.  HENT:  Head: Normocephalic and atraumatic.  Mouth/Throat: Oropharynx is clear and moist.  Eyes: EOM are normal. Pupils are equal, round, and reactive to light.  Neck: Normal range of motion.  Cardiovascular: Normal rate, regular rhythm and normal heart sounds.  Pulmonary/Chest: Effort normal and breath sounds normal.  Abdominal: Soft. Bowel sounds are normal.  Musculoskeletal: Normal range of motion. She exhibits no edema, tenderness or  deformity.  Lymphadenopathy:    She has no cervical adenopathy.  Neurological: She is alert and oriented to person, place, and time.  Skin: Skin is warm and dry. No rash noted. No erythema.  Psychiatric: She has a normal mood and affect. Her behavior is normal. Judgment and thought content normal.  Vitals reviewed.    Lab Results  Component Value Date   WBC 7.3 03/17/2017   HGB 9.5 (L) 03/17/2017   HCT 29.6 (L) 03/17/2017   MCV 98 03/17/2017   PLT 240 03/17/2017   Lab Results  Component Value Date   FERRITIN 220 03/17/2017   IRON 82 03/17/2017   TIBC 303 03/17/2017   UIBC 221 03/17/2017   IRONPCTSAT 27 03/17/2017   Lab Results  Component Value Date   RBC 3.02 (L) 03/17/2017   Lab Results  Component Value Date   KAPLAMBRATIO 4.80 (H) 02/08/2017   Lab  Results  Component Value Date   IGGSERUM 336 (L) 03/17/2017   IGMSERUM 12 (L) 03/17/2017   Lab Results  Component Value Date   MSPIKE Not Observed 02/08/2017     Chemistry      Component Value Date/Time   NA 148 (H) 03/17/2017 1210   K 4.4 03/17/2017 1210   CL 106 03/17/2017 1210   CO2 27 03/17/2017 1210   BUN 23 (H) 03/17/2017 1210   CREATININE 1.1 03/17/2017 1210      Component Value Date/Time   CALCIUM 9.1 03/17/2017 1210   ALKPHOS 52 03/17/2017 1210   AST 21 03/17/2017 1210   ALT 21 03/17/2017 1210   BILITOT 0.60 03/17/2017 1210      Impression and Plan: Jeanne Martinez is a very pleasant 66 yo caucasian female with IgA kappa myeloma with 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 She has responded nicely to treatment and is doing quite well.   Given her travel situation, the fact that she wants to spend more time up in [place], I think we can get her on an all oral regimen.  I will see about getting her on Ninlaro and Pomalidomide.  I think this would be a good combination for her.  I think she would do well with this and tolerate this.  I spent about 40 minutes with she and her family.  I explained the change and why I thought it would be reasonable to do this for her.  Again, I want her quality of life to be as good as possible and for her, her quality of life really is dependent upon how often she can be in [place].  I would like to see her back in about a month.     Volanda Napoleon, MD 12/24/20189:54 AM

## 2017-03-22 ENCOUNTER — Telehealth: Payer: Self-pay | Admitting: Hematology & Oncology

## 2017-03-22 ENCOUNTER — Other Ambulatory Visit: Payer: Self-pay | Admitting: *Deleted

## 2017-03-22 ENCOUNTER — Telehealth: Payer: Self-pay | Admitting: Pharmacist

## 2017-03-22 DIAGNOSIS — C9000 Multiple myeloma not having achieved remission: Secondary | ICD-10-CM

## 2017-03-22 LAB — KAPPA/LAMBDA LIGHT CHAINS
Ig Kappa Free Light Chain: 60.3 mg/L — ABNORMAL HIGH (ref 3.3–19.4)
Ig Lambda Free Light Chain: 2.3 mg/L — ABNORMAL LOW (ref 5.7–26.3)
Kappa/Lambda FluidC Ratio: 26.22 — ABNORMAL HIGH (ref 0.26–1.65)

## 2017-03-22 MED ORDER — IXAZOMIB CITRATE 4 MG PO CAPS: ORAL_CAPSULE | ORAL | 6 refills | Status: DC

## 2017-03-22 NOTE — Telephone Encounter (Addendum)
Oral Oncology Pharmacist Encounter  Received new prescription for Ninlaro (ixazomib) for the treatment of multiple myeloma in conjunction with pomalidomide, planned duration until disease progression or unacceptable drug toxicity.  CMP/CBC from 03/17/17 assessed, no relevant lab abnormalities. Prescription dose and frequency assessed.   Current medication list in Epic reviewed, no DDIs with ixazomib or Pomalidomide identified.   [place] can not ship the medication to [place] for the patient due to pharmacy law, so the Ninlaro (ixazomib) prescription will be sent to [facility] to be processed along with her pomalidomide prescription.   Oral Oncology Clinic will continue to follow for insurance authorization, copayment issues, initial counseling and start date.  Darl Pikes, PharmD, BCPS Hematology/Oncology Clinical Pharmacist [facility] [phone]  03/22/2017 2:05 PM

## 2017-03-22 NOTE — Telephone Encounter (Signed)
Oral Oncology Patient Advocate Encounter  Prior Authorization for Kennieth Rad has been approved.    PA# [id number] Effective dates: 03/26/2016 through 03/27/2018  Patients co-pay is $2294.03.  Oral Oncology Clinic will continue to follow.    [place] Patient Advocate [phone] 03/22/2017 11:26 AM

## 2017-03-22 NOTE — Telephone Encounter (Signed)
Oral Oncology Patient Advocate Encounter  Received notification from [facility] that prior authorization for Jeanne Martinez is required.  PA submitted on CoverMyMeds Key [id number] Status is pending  Oral Oncology Clinic will continue to follow.   [place] Patient Advocate [phone] 03/22/2017 10:40 AM

## 2017-03-23 ENCOUNTER — Telehealth: Payer: Self-pay | Admitting: Hematology & Oncology

## 2017-03-23 LAB — PROTEIN ELECTROPHORESIS, SERUM, WITH REFLEX
A/G Ratio: 1.5 (ref 0.7–1.7)
ALBUMIN: 3.5 g/dL (ref 2.9–4.4)
ALPHA 1: 0.3 g/dL (ref 0.0–0.4)
ALPHA 2: 0.9 g/dL (ref 0.4–1.0)
BETA: 1 g/dL (ref 0.7–1.3)
Gamma Globulin: 0.3 g/dL — ABNORMAL LOW (ref 0.4–1.8)
Globulin, Total: 2.4 g/dL (ref 2.2–3.9)
Interpretation(See Below): 0
M-Spike, %: 0.2 g/dL — ABNORMAL HIGH
Total Protein: 5.9 g/dL — ABNORMAL LOW (ref 6.0–8.5)

## 2017-03-23 MED ORDER — IXAZOMIB CITRATE 4 MG PO CAPS: ORAL_CAPSULE | ORAL | 6 refills | Status: DC

## 2017-03-23 NOTE — Telephone Encounter (Signed)
Oral Oncology Patient Advocate Encounter  Was successful in securing patient an $ 10,000.00 grant from [employer] to provide copayment coverage for her Ninlaro. This will keep the out of pocket expense at $0.    I have spoken with the patient.    The billing information is as follows and has been shared with [facility].   Member ID: [id number] Group ID: [id number] RxBin: [id number] Dates of Eligibility: 03/23/2017 through 02/20/2018   [name] Patient Advocate [phone] 03/23/2017 1:45 PM

## 2017-03-27 ENCOUNTER — Telehealth: Payer: Self-pay | Admitting: Pharmacist

## 2017-03-27 ENCOUNTER — Telehealth: Payer: Self-pay | Admitting: Hematology & Oncology

## 2017-03-27 NOTE — Telephone Encounter (Signed)
Oral Chemotherapy Pharmacist Encounter  Patient Education I spoke with patient and her husband for overview of new oral chemotherapy medication: Pomalyst (pomalidomide) and Ninlaro (ixazomib) for the treatment of Multiple Myeloma, planned duration until disease progression or unacceptable drug toxicity.   Counseled patient on administration, dosing, side effects, monitoring, drug-food interactions, safe handling, storage, and disposal.  Ninlaro: Patient will take 1 capsule (35m) weekly for 3 weeks on and 1 week off..  Pomalyst: Patient will take 1 capsule (3 mg total) by mouth daily. Take with water on days 1-21. Repeat every 28 days.  She plans on getting started tomorrow 03/28/17.   Side effects include but not limited to:  Ninlaro: N/V/D, constipation, rash, blurred vision, dry eye, and conjunctivitis Pomalyst: fatigue, constipation, diarrhea  Reviewed with patient importance of keeping a medication schedule and plan for any missed doses for both medication.  They both voiced understanding and appreciation. All questions answered. Medication handout for Pomalyst, medication calendar (Jan/Feb), and business cards were placed in the mail.   Provided patient with [facility]. Patient knows to call the office with questions or concerns. Oral Chemotherapy Navigation Clinic will continue to follow.  ADarl Pikes PharmD, BCPS Hematology/Oncology Clinical Pharmacist [phone] 03/27/2017 3:58 PM

## 2017-03-27 NOTE — Telephone Encounter (Signed)
Oral Oncology Patient Advocate Encounter  Called [facility] to check on patients medication Ninlaro and Pomalyst it was shipped 03/24/2017. Patient will receive today. Tracking # [id number]  [place] Patient Advocate [phone] 03/27/2017 9:03 AM

## 2017-03-29 NOTE — Telephone Encounter (Signed)
Oral Chemotherapy Pharmacist Encounter  Spoke with Dr. Marin Olp and he would like for Jeanne Martinez to start taking aspirin 81mg  daily. Spoke with patient and her husband and reviewed this with them. They stated their understanding.  Darl Pikes, PharmD, BCPS Hematology/Oncology Clinical Pharmacist [facility] [phone]  03/29/2017 9:50 AM

## 2017-04-04 ENCOUNTER — Ambulatory Visit: Payer: Medicare (Managed Care)

## 2017-04-04 ENCOUNTER — Other Ambulatory Visit: Payer: Medicare (Managed Care)

## 2017-04-04 ENCOUNTER — Ambulatory Visit: Payer: Medicare (Managed Care) | Admitting: Hematology & Oncology

## 2017-04-05 ENCOUNTER — Ambulatory Visit: Payer: Medicare (Managed Care)

## 2017-04-05 ENCOUNTER — Ambulatory Visit: Payer: Medicare (Managed Care) | Admitting: Hematology & Oncology

## 2017-04-05 ENCOUNTER — Other Ambulatory Visit: Payer: Medicare (Managed Care)

## 2017-04-06 ENCOUNTER — Ambulatory Visit: Payer: Medicare (Managed Care) | Admitting: Hematology & Oncology

## 2017-04-06 ENCOUNTER — Ambulatory Visit: Payer: Medicare (Managed Care)

## 2017-04-06 ENCOUNTER — Other Ambulatory Visit: Payer: Medicare (Managed Care)

## 2017-04-07 ENCOUNTER — Other Ambulatory Visit: Payer: Self-pay | Admitting: *Deleted

## 2017-04-07 MED ORDER — POMALIDOMIDE 3 MG PO CAPS: 3.0000 mg | ORAL_CAPSULE | Freq: Every day | ORAL | 4 refills | Status: DC

## 2017-04-13 ENCOUNTER — Telehealth: Payer: Self-pay | Admitting: *Deleted

## 2017-04-13 NOTE — Telephone Encounter (Addendum)
Patient doesn't want to be seen for her 04/26/2017. They have moved back to [facility] and they don't want to come back to the area until the spring. She has just started week three of a new Ninlaro/Pomalyst regimen.   Reviewed with patient the importance of being seen for physical assessment and lab workup prior to starting the next cycle of Ninlaro/Pom. Explained how this is a significant regimen that may cause lab changes and she really needs to see the doctor after this first cycle, then we can increase the time between appointments.  The patient states they will keep the appointment, however if the weather is bad, they won't come. Reviewed with Dr Marin Olp. He would like orders for labs sent to a facility local to them, incase they decide not to make the trip, so that lab work would at least be obtained.   Received from the patient the name of the following  [facility] Fax [phone]  Orders for lab work faxed. Patient understands that they need to make every effort to attend this appointment, and only if there is bad weather they will get labs at the local facility.

## 2017-04-25 ENCOUNTER — Ambulatory Visit: Payer: Medicare (Managed Care) | Admitting: Hematology & Oncology

## 2017-04-25 ENCOUNTER — Ambulatory Visit: Payer: Medicare (Managed Care)

## 2017-04-25 ENCOUNTER — Other Ambulatory Visit: Payer: Medicare (Managed Care)

## 2017-04-26 ENCOUNTER — Ambulatory Visit: Payer: Medicare (Managed Care) | Admitting: Hematology & Oncology

## 2017-04-26 ENCOUNTER — Other Ambulatory Visit: Payer: Medicare (Managed Care)

## 2017-04-26 ENCOUNTER — Ambulatory Visit: Payer: Medicare (Managed Care)

## 2017-05-02 ENCOUNTER — Other Ambulatory Visit: Payer: Self-pay | Admitting: *Deleted

## 2017-05-02 MED ORDER — POMALIDOMIDE 3 MG PO CAPS: 3.0000 mg | ORAL_CAPSULE | Freq: Every day | ORAL | 0 refills | Status: DC

## 2017-05-03 ENCOUNTER — Inpatient Hospital Stay: Payer: Medicare (Managed Care)

## 2017-05-03 ENCOUNTER — Inpatient Hospital Stay: Payer: Medicare (Managed Care) | Attending: Hematology & Oncology

## 2017-05-03 ENCOUNTER — Encounter: Payer: Self-pay | Admitting: Hematology & Oncology

## 2017-05-03 ENCOUNTER — Inpatient Hospital Stay (HOSPITAL_BASED_OUTPATIENT_CLINIC_OR_DEPARTMENT_OTHER): Payer: Medicare (Managed Care) | Admitting: Hematology & Oncology

## 2017-05-03 ENCOUNTER — Other Ambulatory Visit: Payer: Self-pay

## 2017-05-03 VITALS — BP 144/58 | HR 83 | Temp 97.9°F | Resp 16 | Wt 101.0 lb

## 2017-05-03 DIAGNOSIS — C9000 Multiple myeloma not having achieved remission: Secondary | ICD-10-CM

## 2017-05-03 LAB — CMP (CANCER CENTER ONLY)
ALT: 15 U/L (ref 0–55)
AST: 20 U/L (ref 5–34)
Albumin: 3.2 g/dL — ABNORMAL LOW (ref 3.5–5.0)
Alkaline Phosphatase: 57 U/L (ref 26–84)
Anion gap: 11 (ref 5–15)
BUN: 24 mg/dL — AB (ref 7–22)
CHLORIDE: 111 mmol/L — AB (ref 98–108)
CO2: 24 mmol/L (ref 18–33)
Calcium: 8.4 mg/dL (ref 8.0–10.3)
Creatinine: 1 mg/dL (ref 0.60–1.10)
GLUCOSE: 133 mg/dL — AB (ref 73–118)
POTASSIUM: 4.6 mmol/L (ref 3.3–4.7)
SODIUM: 146 mmol/L — AB (ref 128–145)
Total Bilirubin: 0.5 mg/dL (ref 0.2–1.2)
Total Protein: 7 g/dL (ref 6.4–8.1)

## 2017-05-03 LAB — CBC WITH DIFFERENTIAL (CANCER CENTER ONLY)
Basophils Absolute: 0 10*3/uL (ref 0.0–0.1)
Basophils Relative: 0 %
EOS ABS: 0 10*3/uL (ref 0.0–0.5)
EOS PCT: 0 %
HCT: 30.6 % — ABNORMAL LOW (ref 34.8–46.6)
Hemoglobin: 9.8 g/dL — ABNORMAL LOW (ref 11.6–15.9)
Lymphocytes Relative: 6 %
Lymphs Abs: 0.8 10*3/uL — ABNORMAL LOW (ref 0.9–3.3)
MCH: 30.2 pg (ref 26.0–34.0)
MCHC: 32 g/dL (ref 32.0–36.0)
MCV: 94.2 fL (ref 81.0–101.0)
Monocytes Absolute: 0.6 10*3/uL (ref 0.1–0.9)
Monocytes Relative: 4 %
Neutro Abs: 12.2 10*3/uL — ABNORMAL HIGH (ref 1.5–6.5)
Neutrophils Relative %: 90 %
PLATELETS: 281 10*3/uL (ref 145–400)
RBC: 3.25 MIL/uL — AB (ref 3.70–5.32)
RDW: 14.5 % (ref 11.1–15.7)
WBC: 13.6 10*3/uL — AB (ref 3.9–10.0)

## 2017-05-03 NOTE — Progress Notes (Signed)
Hematology and Oncology Follow Up Visit  Jeanne Martinez [id number] [birth year] 67 y.o. 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  As always, she and her husband have come down from [place].  They are planning to head back up to [facility] today or tomorrow.  She now is on Ninlaro/Pomalidomide.  We have her on this since this is oral and she can take this while she is up in [place].  She wants to stay in [place] as long as possible.  She loves to play bridge with her friends up there.  I am at a slightly concerned with her last myeloma numbers.  Back in December, I saw that her M spike was 0.2 g/dL.  Her kappa light chain was up to 6 mg/dL.  Previously, it was 1 mg/dL.  This is troublesome to me as she had renal insufficiency when she first presented because of significant kappa light chain levels.  Thankfully, her IgA level has remained quite low.  Hopefully, now that she is on treatment, we will see an improvement.  She feels well.  She had no problems over the holidays.  She is eating without difficulty.  She is having no leg swelling.  She is having no rashes.  There is no bleeding or bruising.  She has had no cough or shortness of breath.  She has had no change in bowel or bladder habits.  Overall, I would say her performance status is ECOG 1.    Medications:  Allergies as of 05/03/2017      Reactions   Milk-related Compounds Anaphylaxis   Throat closes    No Known Allergies       Medication List        Accurate as of 05/03/17  5:40 PM. Always use your most recent med list.          aspirin EC 81 MG tablet Take 81 mg by mouth daily.   dexamethasone 4 MG tablet Commonly known as:  DECADRON Take 5 pills once a week with food.   famciclovir 250 MG tablet Commonly known as:  FAMVIR Take  1 tablet (250 mg total) by mouth daily.   ixazomib citrate 4 MG capsule Commonly known as:  NINLARO Take 1 capsule weekly for 3 weeks on and 1 week off.   magnesium oxide 400 MG tablet Commonly known as:  MAG-OX Take 400 mg by mouth daily.   pomalidomide 3 MG capsule Commonly known as:  POMALYST Take 1 capsule (3 mg total) by mouth daily. Take with water on days 1-21. Repeat every 28 days. [id number]   potassium chloride SA 20 MEQ tablet Commonly known as:  K-DUR,KLOR-CON Take 1 tablet (20 mEq total) by mouth every other day.       Allergies:  Allergies  Allergen Reactions  . Milk-Related Compounds Anaphylaxis    Throat closes   . No Known Allergies     Past Medical History, Surgical history, Social history, and Family History were reviewed and updated.  Review of Systems: Review of Systems  Constitutional: Negative.   HENT: Negative.   Eyes: Negative.   Respiratory: Negative.   Cardiovascular: Negative.   Gastrointestinal: Negative.   Genitourinary: Negative.   Musculoskeletal: Negative.   Skin: Negative.   Neurological: Negative.   Endo/Heme/Allergies: Negative.   Psychiatric/Behavioral: Negative.  Physical Exam:  weight is 101 lb (45.8 kg). Her oral temperature is 97.9 F (36.6 C). Her blood pressure is 144/58 (abnormal) and her pulse is 83. Her respiration is 16 and oxygen saturation is 100%.   Wt Readings from Last 3 Encounters:  05/03/17 101 lb (45.8 kg)  03/17/17 103 lb (46.7 kg)  02/21/17 99 lb 12.8 oz (45.3 kg)    Physical Exam  Constitutional: She is oriented to person, place, and time.  HENT:  Head: Normocephalic and atraumatic.  Mouth/Throat: Oropharynx is clear and moist.  Eyes: EOM are normal. Pupils are equal, round, and reactive to light.  Neck: Normal range of motion.  Cardiovascular: Normal rate, regular rhythm and normal heart sounds.  Pulmonary/Chest: Effort normal and breath sounds normal.  Abdominal: Soft. Bowel sounds are  normal.  Musculoskeletal: Normal range of motion. She exhibits no edema, tenderness or deformity.  Lymphadenopathy:    She has no cervical adenopathy.  Neurological: She is alert and oriented to person, place, and time.  Skin: Skin is warm and dry. No rash noted. No erythema.  Psychiatric: She has a normal mood and affect. Her behavior is normal. Judgment and thought content normal.  Vitals reviewed.    Lab Results  Component Value Date   WBC 13.6 (H) 05/03/2017   HGB 9.5 (L) 03/17/2017   HCT 30.6 (L) 05/03/2017   MCV 94.2 05/03/2017   PLT 281 05/03/2017   Lab Results  Component Value Date   FERRITIN 220 03/17/2017   IRON 82 03/17/2017   TIBC 303 03/17/2017   UIBC 221 03/17/2017   IRONPCTSAT 27 03/17/2017   Lab Results  Component Value Date   RBC 3.25 (L) 05/03/2017   Lab Results  Component Value Date   KAPLAMBRATIO 26.22 (H) 03/17/2017   Lab Results  Component Value Date   IGGSERUM 336 (L) 03/17/2017   IGMSERUM 12 (L) 03/17/2017   Lab Results  Component Value Date   MSPIKE 0.2 (H) 03/17/2017     Chemistry      Component Value Date/Time   NA 146 (H) 05/03/2017 1233   NA 148 (H) 03/17/2017 1210   K 4.6 05/03/2017 1233   K 4.4 03/17/2017 1210   CL 111 (H) 05/03/2017 1233   CL 106 03/17/2017 1210   CO2 24 05/03/2017 1233   CO2 27 03/17/2017 1210   BUN 24 (H) 05/03/2017 1233   BUN 23 (H) 03/17/2017 1210   CREATININE 1.00 05/03/2017 1233   CREATININE 1.1 03/17/2017 1210      Component Value Date/Time   CALCIUM 8.4 05/03/2017 1233   CALCIUM 9.1 03/17/2017 1210   ALKPHOS 57 05/03/2017 1233   ALKPHOS 52 03/17/2017 1210   AST 20 05/03/2017 1233   ALT 15 05/03/2017 1233   ALT 21 03/17/2017 1210   BILITOT 0.5 05/03/2017 1233      Impression and Plan: Jeanne Martinez is a very pleasant 67 yo caucasian female with IgA kappa myeloma with 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   Again, we will have to see what her myeloma levels look like.  If  we find that they are still going up, we probably will have to get her back onto the Cytoxan/Velcade/dexamethasone protocol.  Another option might be switched down to the Velcade for Kyprolis.  I spent about 35 minutes with she and her family.  Over 50% of the time was spent face-to-face talking with him about her labs and explaining my recommendations in case we have to  make a change in treatment.  I do not see a problem with her going back home to [facility] for right now.  I will tentatively make an appointment for her to come back to see [place] in 6 weeks.    Volanda Napoleon, MD 2/6/20195:40 PM

## 2017-05-04 LAB — KAPPA/LAMBDA LIGHT CHAINS
KAPPA FREE LGHT CHN: 17 mg/L (ref 3.3–19.4)
KAPPA, LAMDA LIGHT CHAIN RATIO: 2.54 — AB (ref 0.26–1.65)
LAMDA FREE LIGHT CHAINS: 6.7 mg/L (ref 5.7–26.3)

## 2017-05-04 LAB — IGG, IGA, IGM
IGA: 34 mg/dL — AB (ref 87–352)
IGG (IMMUNOGLOBIN G), SERUM: 354 mg/dL — AB (ref 700–1600)
IgM (Immunoglobulin M), Srm: 12 mg/dL — ABNORMAL LOW (ref 26–217)

## 2017-05-05 ENCOUNTER — Telehealth: Payer: Self-pay | Admitting: *Deleted

## 2017-05-05 NOTE — Telephone Encounter (Addendum)
Patient's husband is aware of results  ----- Message from Volanda Napoleon, MD sent at 05/04/2017  5:35 PM EST ----- Call - the light chain went from 60 down to 17!!!  The oral meds are working!!!  [place]!!!  [place]

## 2017-05-09 LAB — PROTEIN ELECTROPHORESIS, SERUM, WITH REFLEX
A/G Ratio: 1.1 (ref 0.7–1.7)
ALPHA-1-GLOBULIN: 0.4 g/dL (ref 0.0–0.4)
ALPHA-2-GLOBULIN: 1.1 g/dL — AB (ref 0.4–1.0)
Albumin ELP: 3.2 g/dL (ref 2.9–4.4)
Beta Globulin: 1 g/dL (ref 0.7–1.3)
Gamma Globulin: 0.4 g/dL (ref 0.4–1.8)
Globulin, Total: 2.9 g/dL (ref 2.2–3.9)
M-Spike, %: 0.1 g/dL — ABNORMAL HIGH
SPEP INTERP: 0
Total Protein ELP: 6.1 g/dL (ref 6.0–8.5)

## 2017-05-30 ENCOUNTER — Other Ambulatory Visit: Payer: Self-pay | Admitting: *Deleted

## 2017-05-30 MED ORDER — POMALIDOMIDE 3 MG PO CAPS: 3.0000 mg | ORAL_CAPSULE | Freq: Every day | ORAL | 0 refills | Status: DC

## 2017-06-14 ENCOUNTER — Inpatient Hospital Stay (HOSPITAL_BASED_OUTPATIENT_CLINIC_OR_DEPARTMENT_OTHER): Payer: Medicare Other | Admitting: Hematology & Oncology

## 2017-06-14 ENCOUNTER — Inpatient Hospital Stay: Payer: Medicare Other | Attending: Hematology & Oncology

## 2017-06-14 ENCOUNTER — Encounter: Payer: Self-pay | Admitting: Hematology & Oncology

## 2017-06-14 ENCOUNTER — Other Ambulatory Visit: Payer: Self-pay

## 2017-06-14 ENCOUNTER — Telehealth: Payer: Self-pay | Admitting: *Deleted

## 2017-06-14 ENCOUNTER — Other Ambulatory Visit: Payer: Self-pay | Admitting: Hematology & Oncology

## 2017-06-14 VITALS — BP 133/67 | HR 82 | Temp 98.0°F | Resp 20 | Wt 108.8 lb

## 2017-06-14 DIAGNOSIS — C9000 Multiple myeloma not having achieved remission: Secondary | ICD-10-CM | POA: Diagnosis not present

## 2017-06-14 DIAGNOSIS — D649 Anemia, unspecified: Secondary | ICD-10-CM

## 2017-06-14 DIAGNOSIS — N179 Acute kidney failure, unspecified: Secondary | ICD-10-CM | POA: Diagnosis not present

## 2017-06-14 LAB — CBC WITH DIFFERENTIAL (CANCER CENTER ONLY)
BASOS ABS: 0 10*3/uL (ref 0.0–0.1)
BASOS PCT: 0 %
EOS ABS: 0 10*3/uL (ref 0.0–0.5)
Eosinophils Relative: 0 %
HEMATOCRIT: 30.1 % — AB (ref 34.8–46.6)
HEMOGLOBIN: 9.7 g/dL — AB (ref 11.6–15.9)
Lymphocytes Relative: 10 %
Lymphs Abs: 1.2 10*3/uL (ref 0.9–3.3)
MCH: 30.5 pg (ref 26.0–34.0)
MCHC: 32.2 g/dL (ref 32.0–36.0)
MCV: 94.7 fL (ref 81.0–101.0)
MONO ABS: 1.4 10*3/uL — AB (ref 0.1–0.9)
Monocytes Relative: 11 %
NEUTROS ABS: 9.8 10*3/uL — AB (ref 1.5–6.5)
NEUTROS PCT: 79 %
Platelet Count: 283 10*3/uL (ref 145–400)
RBC: 3.18 MIL/uL — ABNORMAL LOW (ref 3.70–5.32)
RDW: 16.8 % — AB (ref 11.1–15.7)
WBC Count: 12.3 10*3/uL — ABNORMAL HIGH (ref 3.9–10.0)

## 2017-06-14 LAB — CMP (CANCER CENTER ONLY)
ALK PHOS: 52 U/L (ref 26–84)
ALT: 19 U/L (ref 10–47)
ANION GAP: 9 (ref 5–15)
AST: 18 U/L (ref 11–38)
Albumin: 3.6 g/dL (ref 3.5–5.0)
BUN: 30 mg/dL — ABNORMAL HIGH (ref 7–22)
CALCIUM: 9.5 mg/dL (ref 8.0–10.3)
CO2: 28 mmol/L (ref 18–33)
CREATININE: 0.6 mg/dL (ref 0.60–1.20)
Chloride: 106 mmol/L (ref 98–108)
Glucose, Bld: 134 mg/dL — ABNORMAL HIGH (ref 73–118)
Potassium: 3 mmol/L — CL (ref 3.3–4.7)
Sodium: 143 mmol/L (ref 128–145)
TOTAL PROTEIN: 6.6 g/dL (ref 6.4–8.1)
Total Bilirubin: 0.7 mg/dL (ref 0.2–1.6)

## 2017-06-14 NOTE — Telephone Encounter (Signed)
Critical Value Potassium 3.0 Dr Ennever notified. No orders at this time.  

## 2017-06-14 NOTE — Progress Notes (Signed)
Hematology and Oncology Follow Up Visit  Jeanne Martinez [id number] 19-Aug-1950 67 y.o. 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  As always, she and her husband have come down from [place].  They are planning to head back up to [facility]  tomorrow.   she seems to be doing pretty well with the Ninlaro/Pomalidomide combination.  She is really not having much in the way of side effects.  We last saw her in early February, her M spike was 0.1 g/dL.  Her IgA  level was 34 mg/dL.  Her kappa light chain was 1.7 mg/dL.  She is still eating well.  She is pretty active playing bridge with her friends.  She and her husband have a lot of friends up in [place].  She has had no change in bowel or bladder habits.  She has had some swelling in her feet.  I think this is multifactorial.  I think this is part leave from her Decadron.  I told her to decrease Decadron from 20 mg weekly to 12 mg weekly.  She is also anemic.  This also will affect her swelling.  She does have a low erythropoietin level of 10.  As such, we could utilize Aranesp or Procrit to get her hemoglobin back up.  She is not complaining of any pain.  There is no cough.  She is had no bleeding.  She is had no visual problems.  She actually does all the driving when she and her husband go back and forth from [place] to [place].    Overall, I would say her performance status is ECOG 1.    Medications:  Allergies as of 06/14/2017      Reactions   Milk-related Compounds Anaphylaxis   Throat closes    No Known Allergies       Medication List        Accurate as of 06/14/17  6:09 PM. Always use your most recent med list.          aspirin EC 81 MG tablet Take 81 mg by mouth daily.   cholecalciferol 400 units Tabs tablet Commonly  known as:  VITAMIN D Take 800 Units by mouth daily.   dexamethasone 4 MG tablet Commonly known as:  DECADRON TAKE 5 TABLETS ONCE WEEKLY WITH FOOD   famciclovir 250 MG tablet Commonly known as:  FAMVIR Take 1 tablet (250 mg total) by mouth daily.   ixazomib citrate 4 MG capsule Commonly known as:  NINLARO Take 1 capsule weekly for 3 weeks on and 1 week off.   magnesium oxide 400 MG tablet Commonly known as:  MAG-OX Take 400 mg by mouth daily.   multivitamin tablet Take 1 tablet by mouth daily.   pomalidomide 3 MG capsule Commonly known as:  POMALYST Take 1 capsule (3 mg total) by mouth daily. Take with water on days 1-21. Repeat every 28 days. [id number]   potassium chloride SA 20 MEQ tablet Commonly known as:  K-DUR,KLOR-CON Take 1 tablet (20 mEq total) by mouth every other day.       Allergies:  Allergies  Allergen Reactions  . Milk-Related Compounds Anaphylaxis    Throat closes   . No Known Allergies     Past Medical History, Surgical history, Social history, and Family History were reviewed and updated.  Review of Systems: Review of Systems  Constitutional: Negative.   HENT: Negative.   Eyes: Negative.   Respiratory: Negative.   Cardiovascular: Negative.   Gastrointestinal: Negative.   Genitourinary: Negative.   Musculoskeletal: Negative.   Skin: Negative.   Neurological: Negative.   Endo/Heme/Allergies: Negative.   Psychiatric/Behavioral: Negative.     Physical Exam:  weight is 108 lb 12.8 oz (49.4 kg). Her oral temperature is 98 F (36.7 C). Her blood pressure is 133/67 and her pulse is 82. Her respiration is 20 and oxygen saturation is 100%.   Wt Readings from Last 3 Encounters:  06/14/17 108 lb 12.8 oz (49.4 kg)  05/03/17 101 lb (45.8 kg)  03/17/17 103 lb (46.7 kg)    Physical Exam  Constitutional: She is oriented to person, place, and time.  HENT:  Head: Normocephalic and atraumatic.  Mouth/Throat: Oropharynx is clear and moist.    Eyes: EOM are normal. Pupils are equal, round, and reactive to light.  Neck: Normal range of motion.  Cardiovascular: Normal rate, regular rhythm and normal heart sounds.  Pulmonary/Chest: Effort normal and breath sounds normal.  Abdominal: Soft. Bowel sounds are normal.  Musculoskeletal: Normal range of motion. She exhibits no edema, tenderness or deformity.  Lymphadenopathy:    She has no cervical adenopathy.  Neurological: She is alert and oriented to person, place, and time.  Skin: Skin is warm and dry. No rash noted. No erythema.  Psychiatric: She has a normal mood and affect. Her behavior is normal. Judgment and thought content normal.  Vitals reviewed.    Lab Results  Component Value Date   WBC 12.3 (H) 06/14/2017   HGB 9.5 (L) 03/17/2017   HCT 30.1 (L) 06/14/2017   MCV 94.7 06/14/2017   PLT 283 06/14/2017   Lab Results  Component Value Date   FERRITIN 220 03/17/2017   IRON 82 03/17/2017   TIBC 303 03/17/2017   UIBC 221 03/17/2017   IRONPCTSAT 27 03/17/2017   Lab Results  Component Value Date   RBC 3.18 (L) 06/14/2017   Lab Results  Component Value Date   KPAFRELGTCHN 17.0 05/03/2017   LAMBDASER 6.7 05/03/2017   KAPLAMBRATIO 2.54 (H) 05/03/2017   Lab Results  Component Value Date   IGGSERUM 354 (L) 05/03/2017   IGA 34 (L) 05/03/2017   IGMSERUM 12 (L) 05/03/2017   Lab Results  Component Value Date   TOTALPROTELP 6.1 05/03/2017   ALBUMINELP 3.2 05/03/2017   A1GS 0.4 05/03/2017   A2GS 1.1 (H) 05/03/2017   BETS 1.0 05/03/2017   GAMS 0.4 05/03/2017   MSPIKE 0.1 (H) 05/03/2017     Chemistry      Component Value Date/Time   NA 143 06/14/2017 1413   NA 148 (H) 03/17/2017 1210   K 3.0 (LL) 06/14/2017 1413   K 4.4 03/17/2017 1210   CL 106 06/14/2017 1413   CL 106 03/17/2017 1210   CO2 28 06/14/2017 1413   CO2 27 03/17/2017 1210   BUN 30 (H) 06/14/2017 1413   BUN 23 (H) 03/17/2017 1210   CREATININE 0.60 06/14/2017 1413   CREATININE 1.1 03/17/2017  1210      Component Value Date/Time   CALCIUM 9.5 06/14/2017 1413   CALCIUM 9.1 03/17/2017 1210   ALKPHOS 52 06/14/2017 1413   ALKPHOS 52 03/17/2017 1210   AST 18 06/14/2017 1413   ALT 19 06/14/2017 1413   ALT 21 03/17/2017 1210   BILITOT 0.7 06/14/2017 1413      Impression and Plan: Ms.  Hobby is a very pleasant 67 yo caucasian female with IgA kappa myeloma with 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   Again, we will have to see what her myeloma levels look like.  If we find that they are still going up, we probably will have to get her back onto the Cytoxan/Velcade/dexamethasone protocol.  Another option might be to switch her to Kyprolis.  I spent about 35 minutes with she and her husband.  Over 50% of the time was spent face-to-face talking with him about her labs and explaining my recommendations in case we have to make a change in treatment.  I do not see a problem with her going back home to [facility] for right now.  I did tell her to start taking potassium again.  She will take 20 mEq daily.  She will have labs done up in [place] in 3 weeks.  I will tentatively make an appointment for her to come back to see [place] in 6 weeks.    Volanda Napoleon, MD 3/20/20196:09 PM

## 2017-06-15 ENCOUNTER — Other Ambulatory Visit: Payer: Medicare (Managed Care)

## 2017-06-15 ENCOUNTER — Ambulatory Visit: Payer: Medicare (Managed Care) | Admitting: Hematology & Oncology

## 2017-06-15 LAB — KAPPA/LAMBDA LIGHT CHAINS
KAPPA FREE LGHT CHN: 597 mg/L — AB (ref 3.3–19.4)
Kappa, lambda light chain ratio: 271.36 — ABNORMAL HIGH (ref 0.26–1.65)
Lambda free light chains: 2.2 mg/L — ABNORMAL LOW (ref 5.7–26.3)

## 2017-06-15 LAB — IGG, IGA, IGM
IGA: 11 mg/dL — AB (ref 87–352)
IGM (IMMUNOGLOBULIN M), SRM: 9 mg/dL — AB (ref 26–217)
IgG (Immunoglobin G), Serum: 279 mg/dL — ABNORMAL LOW (ref 700–1600)

## 2017-06-20 LAB — PROTEIN ELECTROPHORESIS, SERUM, WITH REFLEX
A/G RATIO SPE: 1.3 (ref 0.7–1.7)
ALBUMIN ELP: 3.4 g/dL (ref 2.9–4.4)
Alpha-1-Globulin: 0.3 g/dL (ref 0.0–0.4)
Alpha-2-Globulin: 1 g/dL (ref 0.4–1.0)
Beta Globulin: 1.1 g/dL (ref 0.7–1.3)
GAMMA GLOBULIN: 0.3 g/dL — AB (ref 0.4–1.8)
GLOBULIN, TOTAL: 2.7 g/dL (ref 2.2–3.9)
M-Spike, %: 0.1 g/dL — ABNORMAL HIGH
SPEP Interpretation: 0
TOTAL PROTEIN ELP: 6.1 g/dL (ref 6.0–8.5)

## 2017-06-26 ENCOUNTER — Telehealth: Payer: Self-pay | Admitting: *Deleted

## 2017-06-26 ENCOUNTER — Other Ambulatory Visit: Payer: Self-pay | Admitting: *Deleted

## 2017-06-26 MED ORDER — POMALIDOMIDE 3 MG PO CAPS
3.0000 mg | ORAL_CAPSULE | Freq: Every day | ORAL | 0 refills | Status: DC
Start: 2017-06-26 — End: 2017-07-19

## 2017-06-26 NOTE — Telephone Encounter (Signed)
-----   Message from Volanda Napoleon, MD sent at 06/26/2017  2:23 PM EDT ----- I left a message for her son.  I told him that her Kappa Lightchain was going up quickly.  Because of this, we really need to get her mom back down here from [facility] so we can change treatments.  I probably would consider getting her back on Cytoxan based therapy but also give her Kyprolis.  I know this is more of an aggravation but I think this is the best way for [place] to prevent her from running in the kidney problems again.  I told him to call us if he has any questions.  Laurey Arrow

## 2017-07-04 ENCOUNTER — Telehealth: Payer: Self-pay | Admitting: *Deleted

## 2017-07-04 ENCOUNTER — Other Ambulatory Visit: Payer: Self-pay | Admitting: *Deleted

## 2017-07-04 ENCOUNTER — Inpatient Hospital Stay: Payer: Medicare Other

## 2017-07-04 ENCOUNTER — Inpatient Hospital Stay: Payer: Medicare Other | Attending: Hematology & Oncology | Admitting: Hematology & Oncology

## 2017-07-04 VITALS — BP 111/43 | HR 90 | Temp 98.0°F | Resp 18 | Wt 98.8 lb

## 2017-07-04 DIAGNOSIS — C9002 Multiple myeloma in relapse: Secondary | ICD-10-CM | POA: Insufficient documentation

## 2017-07-04 DIAGNOSIS — Z5112 Encounter for antineoplastic immunotherapy: Secondary | ICD-10-CM | POA: Insufficient documentation

## 2017-07-04 DIAGNOSIS — C9000 Multiple myeloma not having achieved remission: Secondary | ICD-10-CM

## 2017-07-04 LAB — CBC WITH DIFFERENTIAL (CANCER CENTER ONLY)
BASOS ABS: 0.2 10*3/uL — AB (ref 0.0–0.1)
Basophils Relative: 4 %
EOS ABS: 0.7 10*3/uL — AB (ref 0.0–0.5)
Eosinophils Relative: 13 %
HEMATOCRIT: 26.5 % — AB (ref 34.8–46.6)
HEMOGLOBIN: 8.8 g/dL — AB (ref 11.6–15.9)
LYMPHS PCT: 24 %
Lymphs Abs: 1.2 10*3/uL (ref 0.9–3.3)
MCH: 30.6 pg (ref 26.0–34.0)
MCHC: 33.2 g/dL (ref 32.0–36.0)
MCV: 92 fL (ref 81.0–101.0)
MONOS PCT: 23 %
Monocytes Absolute: 1.2 10*3/uL — ABNORMAL HIGH (ref 0.1–0.9)
NEUTROS PCT: 36 %
Neutro Abs: 1.8 10*3/uL (ref 1.5–6.5)
Platelet Count: 48 10*3/uL — ABNORMAL LOW (ref 145–400)
RBC: 2.88 MIL/uL — AB (ref 3.70–5.32)
RDW: 16.3 % — ABNORMAL HIGH (ref 11.1–15.7)
WBC: 5.1 10*3/uL (ref 3.9–10.0)

## 2017-07-04 LAB — CMP (CANCER CENTER ONLY)
ALBUMIN: 3.4 g/dL — AB (ref 3.5–5.0)
ALK PHOS: 62 U/L (ref 26–84)
ALT: 20 U/L (ref 10–47)
AST: 23 U/L (ref 11–38)
Anion gap: 8 (ref 5–15)
BILIRUBIN TOTAL: 0.8 mg/dL (ref 0.2–1.6)
BUN: 36 mg/dL — AB (ref 7–22)
CALCIUM: 9.2 mg/dL (ref 8.0–10.3)
CO2: 26 mmol/L (ref 18–33)
CREATININE: 3.2 mg/dL — AB (ref 0.60–1.20)
Chloride: 108 mmol/L (ref 98–108)
Glucose, Bld: 127 mg/dL — ABNORMAL HIGH (ref 73–118)
Potassium: 4.5 mmol/L (ref 3.3–4.7)
SODIUM: 142 mmol/L (ref 128–145)
Total Protein: 7 g/dL (ref 6.4–8.1)

## 2017-07-04 MED ORDER — FAMCICLOVIR 250 MG PO TABS: 250.0000 mg | ORAL_TABLET | Freq: Every day | ORAL | 8 refills | Status: DC

## 2017-07-04 NOTE — Telephone Encounter (Signed)
Critical Value Creatinine 3.20 Dr Marin Olp notified. No orders at this time.

## 2017-07-04 NOTE — Progress Notes (Signed)
Hematology and Oncology Follow Up Visit  Jeanne Martinez [id number] [birth year] 67 y.o. 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  Unfortunately, we saw her a couple weeks ago, we found that her Kappa light  chains were going up quickly.  When she first presented with her myeloma, her I chains were quite high and that she developed renal failure.  Today, her kidney function is starting to decline quickly.  Her creatinine is 3.2.  Couple weeks ago her creatinine was 0.6.  Her platelet count is down.  Her hemoglobin is also down.  I think it is obvious that the Ninlaro/Pomalidomide just are not working right now.  I think we probably should get her back onto the Cytoxan/Velcade protocol.  This really helped her quite a bit.  She actually feels quite well.  She really has had no complaints.  She is eating well.  There is no nausea or vomiting.  She and her husband had to drive back from [place].  They were living up in [place].  They wanted to stay up in [place] while she was taking oral therapy.  She has had no leg swelling.  She had no rashes.  She has had no fever.  Is been no cough or shortness of breath.  Overall, her performance status is ECOG 1.     Medications:  Allergies as of 07/04/2017      Reactions   Milk-related Compounds Anaphylaxis   Throat closes    No Known Allergies       Medication List        Accurate as of 07/04/17  5:55 PM. Always use your most recent med list.          aspirin EC 81 MG tablet Take 81 mg by mouth daily.   cholecalciferol 400 units Tabs tablet Commonly known as:  VITAMIN D Take 800 Units by mouth daily.   dexamethasone 4 MG tablet Commonly known as:  DECADRON TAKE 5  TABLETS ONCE WEEKLY WITH FOOD   famciclovir 250 MG tablet Commonly known as:  FAMVIR Take 1 tablet (250 mg total) by mouth daily.   ixazomib citrate 4 MG capsule Commonly known as:  NINLARO Take 1 capsule weekly for 3 weeks on and 1 week off.   magnesium oxide 400 MG tablet Commonly known as:  MAG-OX Take 400 mg by mouth daily.   multivitamin tablet Take 1 tablet by mouth daily.   pomalidomide 3 MG capsule Commonly known as:  POMALYST Take 1 capsule (3 mg total) by mouth daily. Take with water on days 1-21. Repeat every 28 days. [id number]   potassium chloride 10 MEQ tablet Commonly known as:  K-DUR Take 10 mEq by mouth daily.       Allergies:  Allergies  Allergen Reactions  . Milk-Related Compounds Anaphylaxis    Throat closes   . No Known Allergies     Past Medical History, Surgical history, Social history, and Family History were reviewed and updated.  Review of Systems: Review of Systems  Constitutional: Negative.   HENT: Negative.   Eyes: Negative.   Respiratory: Negative.   Cardiovascular: Negative.   Gastrointestinal: Negative.   Genitourinary: Negative.   Musculoskeletal: Negative.   Skin: Negative.  Neurological: Negative.   Endo/Heme/Allergies: Negative.   Psychiatric/Behavioral: Negative.     Physical Exam:  weight is 98 lb 12 oz (44.8 kg). Her oral temperature is 98 F (36.7 C). Her blood pressure is 111/43 (abnormal) and her pulse is 90. Her respiration is 18 and oxygen saturation is 100%.   Wt Readings from Last 3 Encounters:  07/04/17 98 lb 12 oz (44.8 kg)  06/14/17 108 lb 12.8 oz (49.4 kg)  05/03/17 101 lb (45.8 kg)    Physical Exam  Constitutional: She is oriented to person, place, and time.  HENT:  Head: Normocephalic and atraumatic.  Mouth/Throat: Oropharynx is clear and moist.  Eyes: Pupils are equal, round, and reactive to light. EOM are normal.  Neck: Normal range of motion.  Cardiovascular: Normal rate, regular rhythm  and normal heart sounds.  Pulmonary/Chest: Effort normal and breath sounds normal.  Abdominal: Soft. Bowel sounds are normal.  Musculoskeletal: Normal range of motion. She exhibits no edema, tenderness or deformity.  Lymphadenopathy:    She has no cervical adenopathy.  Neurological: She is alert and oriented to person, place, and time.  Skin: Skin is warm and dry. No rash noted. No erythema.  Psychiatric: She has a normal mood and affect. Her behavior is normal. Judgment and thought content normal.  Vitals reviewed.    Lab Results  Component Value Date   WBC 5.1 07/04/2017   HGB 9.5 (L) 03/17/2017   HCT 26.5 (L) 07/04/2017   MCV 92.0 07/04/2017   PLT 48 (L) 07/04/2017   Lab Results  Component Value Date   FERRITIN 220 03/17/2017   IRON 82 03/17/2017   TIBC 303 03/17/2017   UIBC 221 03/17/2017   IRONPCTSAT 27 03/17/2017   Lab Results  Component Value Date   RBC 2.88 (L) 07/04/2017   Lab Results  Component Value Date   KPAFRELGTCHN 597.0 (H) 06/14/2017   LAMBDASER 2.2 (L) 06/14/2017   KAPLAMBRATIO 271.36 (H) 06/14/2017   Lab Results  Component Value Date   IGGSERUM 279 (L) 06/14/2017   IGA 11 (L) 06/14/2017   IGMSERUM 9 (L) 06/14/2017   Lab Results  Component Value Date   TOTALPROTELP 6.1 06/14/2017   ALBUMINELP 3.4 06/14/2017   A1GS 0.3 06/14/2017   A2GS 1.0 06/14/2017   BETS 1.1 06/14/2017   GAMS 0.3 (L) 06/14/2017   MSPIKE 0.1 (H) 06/14/2017     Chemistry      Component Value Date/Time   NA 142 07/04/2017 1352   NA 148 (H) 03/17/2017 1210   K 4.5 07/04/2017 1352   K 4.4 03/17/2017 1210   CL 108 07/04/2017 1352   CL 106 03/17/2017 1210   CO2 26 07/04/2017 1352   CO2 27 03/17/2017 1210   BUN 36 (H) 07/04/2017 1352   BUN 23 (H) 03/17/2017 1210   CREATININE 3.20 (HH) 07/04/2017 1352   CREATININE 1.1 03/17/2017 1210      Component Value Date/Time   CALCIUM 9.2 07/04/2017 1352   CALCIUM 9.1 03/17/2017 1210   ALKPHOS 62 07/04/2017 1352   ALKPHOS 52  03/17/2017 1210   AST 23 07/04/2017 1352   ALT 20 07/04/2017 1352   ALT 21 03/17/2017 1210   BILITOT 0.8 07/04/2017 1352      Impression and Plan: Jeanne Martinez is a very pleasant 67 yo caucasian female with IgA kappa myeloma with 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  It is clear that she is starting to go into renal failure again.  I think  that the light chains are causing a problem for her.  We will get started on her protocol tomorrow.  We will treat her weekly for 3 weeks in a row and then off for a week.  I think it will be obvious whether or not she is going to respond.  Thankfully, she is still in pretty good shape.  I spent about 45 minutes with she and her family.  I spent over half the time face-to-face with them.  Answered all their questions.  We will plan to see her back in a few weeks before she starts her second cycle of treatment.  Unfortunately, I do not think she will be able to go back up to [facility] right now.  Volanda Napoleon, MD 4/9/20195:55 PM

## 2017-07-05 ENCOUNTER — Inpatient Hospital Stay: Payer: Medicare Other

## 2017-07-05 VITALS — BP 129/61 | HR 89 | Temp 98.1°F | Resp 18

## 2017-07-05 DIAGNOSIS — C9000 Multiple myeloma not having achieved remission: Secondary | ICD-10-CM

## 2017-07-05 DIAGNOSIS — Z5112 Encounter for antineoplastic immunotherapy: Secondary | ICD-10-CM | POA: Diagnosis not present

## 2017-07-05 LAB — KAPPA/LAMBDA LIGHT CHAINS
KAPPA FREE LGHT CHN: 7893.5 mg/L — AB (ref 3.3–19.4)
Kappa, lambda light chain ratio: 2631.17 — ABNORMAL HIGH (ref 0.26–1.65)
LAMDA FREE LIGHT CHAINS: 3 mg/L — AB (ref 5.7–26.3)

## 2017-07-05 LAB — IGG, IGA, IGM
IGG (IMMUNOGLOBIN G), SERUM: 229 mg/dL — AB (ref 700–1600)
IgM (Immunoglobulin M), Srm: 5 mg/dL — ABNORMAL LOW (ref 26–217)

## 2017-07-05 MED ORDER — PALONOSETRON HCL INJECTION 0.25 MG/5ML
INTRAVENOUS | Status: AC
  Filled 2017-07-05: qty 5

## 2017-07-05 MED ORDER — PALONOSETRON HCL INJECTION 0.25 MG/5ML
0.2500 mg | Freq: Once | INTRAVENOUS | Status: AC
  Administered 2017-07-05: 0.25 mg via INTRAVENOUS

## 2017-07-05 MED ORDER — DEXAMETHASONE 4 MG PO TABS
40.0000 mg | ORAL_TABLET | Freq: Once | ORAL | Status: AC
  Administered 2017-07-05: 40 mg via ORAL

## 2017-07-05 MED ORDER — BORTEZOMIB CHEMO SQ INJECTION 3.5 MG (2.5MG/ML)
1.5000 mg/m2 | Freq: Once | INTRAMUSCULAR | Status: AC
  Administered 2017-07-05: 2 mg via SUBCUTANEOUS
  Filled 2017-07-05: qty 2

## 2017-07-05 MED ORDER — SODIUM CHLORIDE 0.9 % IV SOLN
Freq: Once | INTRAVENOUS | Status: AC
  Administered 2017-07-05: 14:00:00 via INTRAVENOUS

## 2017-07-05 MED ORDER — CYCLOPHOSPHAMIDE CHEMO INJECTION 1 GM
300.0000 mg/m2 | Freq: Once | INTRAMUSCULAR | Status: AC
  Administered 2017-07-05: 420 mg via INTRAVENOUS
  Filled 2017-07-05: qty 21

## 2017-07-05 NOTE — Patient Instructions (Signed)
[facility] Discharge Instructions for Patients Receiving Chemotherapy  Today you received the following chemotherapy agents Velcade and Cytoxan.  To help prevent nausea and vomiting after your treatment, we encourage you to take your nausea medication as indicated by your MD.   If you develop nausea and vomiting that is not controlled by your nausea medication, call the clinic.   BELOW ARE SYMPTOMS THAT SHOULD BE REPORTED IMMEDIATELY:  *FEVER GREATER THAN 100.5 F  *CHILLS WITH OR WITHOUT FEVER  NAUSEA AND VOMITING THAT IS NOT CONTROLLED WITH YOUR NAUSEA MEDICATION  *UNUSUAL SHORTNESS OF BREATH  *UNUSUAL BRUISING OR BLEEDING  TENDERNESS IN MOUTH AND THROAT WITH OR WITHOUT PRESENCE OF ULCERS  *URINARY PROBLEMS  *BOWEL PROBLEMS  UNUSUAL RASH Items with * indicate a potential emergency and should be followed up as soon as possible.  Feel free to call the clinic should you have any questions or concerns. The clinic phone number is [phone].  Please show the [facility] at check-in to the Emergency Department and triage nurse.

## 2017-07-05 NOTE — Progress Notes (Signed)
Dr. Marin Olp aware of lab values from yesterday and approved treatment for today.

## 2017-07-10 ENCOUNTER — Telehealth: Payer: Self-pay | Admitting: *Deleted

## 2017-07-10 LAB — IMMUNOFIXATION REFLEX, SERUM
IGG (IMMUNOGLOBIN G), SERUM: 217 mg/dL — AB (ref 700–1600)
IgA: 5 mg/dL — ABNORMAL LOW (ref 87–352)
IgM (Immunoglobulin M), Srm: <5 mg/dL — ABNORMAL LOW (ref 26–217)

## 2017-07-10 LAB — PROTEIN ELECTROPHORESIS, SERUM, WITH REFLEX
A/G RATIO SPE: 1.3 (ref 0.7–1.7)
Albumin ELP: 3.5 g/dL (ref 2.9–4.4)
Alpha-1-Globulin: 0.4 g/dL (ref 0.0–0.4)
Alpha-2-Globulin: 1.1 g/dL — ABNORMAL HIGH (ref 0.4–1.0)
BETA GLOBULIN: 1 g/dL (ref 0.7–1.3)
GLOBULIN, TOTAL: 2.8 g/dL (ref 2.2–3.9)
Gamma Globulin: 0.3 g/dL — ABNORMAL LOW (ref 0.4–1.8)
M-Spike, %: 0.1 g/dL — ABNORMAL HIGH
SPEP INTERP: 0
Total Protein ELP: 6.3 g/dL (ref 6.0–8.5)

## 2017-07-10 NOTE — Telephone Encounter (Signed)
Patient's husband notifying the office that the patient is not feeling well. They are preparing for travel back to [place] from [place] for her treatment on Wednesday. She doesn't feel up to travelling.   She states she feels weak and dizzy. When she's laying down, she's fine, but when she stands she feels dizzy and light headed. She doesn't feel like she can drive here to [facility] tomorrow for her appointment.   Patient is instructed to go to the ED for assessment. Both chemo drugs she received given to husband so that he can relay the information to the ED providers. They will go to the ED to be checked out.   Dr Marin Olp notified of call.

## 2017-07-11 ENCOUNTER — Other Ambulatory Visit: Payer: Self-pay | Admitting: *Deleted

## 2017-07-11 DIAGNOSIS — C9000 Multiple myeloma not having achieved remission: Secondary | ICD-10-CM

## 2017-07-12 ENCOUNTER — Inpatient Hospital Stay: Payer: Medicare Other | Attending: Hematology & Oncology

## 2017-07-12 ENCOUNTER — Inpatient Hospital Stay: Payer: Medicare Other

## 2017-07-12 DIAGNOSIS — C9000 Multiple myeloma not having achieved remission: Secondary | ICD-10-CM

## 2017-07-12 DIAGNOSIS — C9002 Multiple myeloma in relapse: Secondary | ICD-10-CM | POA: Diagnosis not present

## 2017-07-12 LAB — CBC WITH DIFFERENTIAL (CANCER CENTER ONLY)
BASOS PCT: 0 %
Basophils Absolute: 0 10*3/uL (ref 0.0–0.1)
EOS PCT: 0 %
Eosinophils Absolute: 0 10*3/uL (ref 0.0–0.5)
HEMATOCRIT: 24.4 % — AB (ref 34.8–46.6)
Hemoglobin: 8.4 g/dL — ABNORMAL LOW (ref 11.6–15.9)
LYMPHS PCT: 22 %
Lymphs Abs: 0.5 10*3/uL — ABNORMAL LOW (ref 0.9–3.3)
MCH: 29.9 pg (ref 26.0–34.0)
MCHC: 34.4 g/dL (ref 32.0–36.0)
MCV: 86.8 fL (ref 81.0–101.0)
MONOS PCT: 35 %
Monocytes Absolute: 0.8 10*3/uL (ref 0.1–0.9)
NEUTROS ABS: 1 10*3/uL — AB (ref 1.5–6.5)
Neutrophils Relative %: 43 %
Platelet Count: 49 10*3/uL — ABNORMAL LOW (ref 145–400)
RBC: 2.81 MIL/uL — ABNORMAL LOW (ref 3.70–5.32)
RDW: 14.9 % (ref 11.1–15.7)
WBC Count: 2.3 10*3/uL — ABNORMAL LOW (ref 3.9–10.0)

## 2017-07-12 LAB — CMP (CANCER CENTER ONLY)
ALBUMIN: 3.8 g/dL (ref 3.5–5.0)
ALK PHOS: 49 U/L (ref 26–84)
ALT: 23 U/L (ref 10–47)
AST: 22 U/L (ref 11–38)
Anion gap: 13 (ref 5–15)
BUN: 63 mg/dL — AB (ref 7–22)
CO2: 25 mmol/L (ref 18–33)
CREATININE: 2.1 mg/dL — AB (ref 0.60–1.20)
Calcium: 7.6 mg/dL — ABNORMAL LOW (ref 8.0–10.3)
Chloride: 97 mmol/L — ABNORMAL LOW (ref 98–108)
Glucose, Bld: 108 mg/dL (ref 73–118)
Potassium: 3.1 mmol/L — ABNORMAL LOW (ref 3.3–4.7)
SODIUM: 135 mmol/L (ref 128–145)
Total Bilirubin: 0.7 mg/dL (ref 0.2–1.6)
Total Protein: 6.5 g/dL (ref 6.4–8.1)

## 2017-07-12 LAB — SAMPLE TO BLOOD BANK

## 2017-07-12 NOTE — Progress Notes (Signed)
Unable to gain iv access times 2 d/t dehydration, patient to return tomorrow after hydrating tonight. Will try to drink sport drinks,breeze or or boost.

## 2017-07-13 ENCOUNTER — Inpatient Hospital Stay: Payer: Medicare Other

## 2017-07-13 VITALS — BP 93/50 | HR 72 | Temp 97.6°F | Resp 18

## 2017-07-13 DIAGNOSIS — Z5112 Encounter for antineoplastic immunotherapy: Secondary | ICD-10-CM | POA: Diagnosis not present

## 2017-07-13 DIAGNOSIS — C9000 Multiple myeloma not having achieved remission: Secondary | ICD-10-CM

## 2017-07-13 LAB — IGG, IGA, IGM: IGG (IMMUNOGLOBIN G), SERUM: 183 mg/dL — AB (ref 700–1600)

## 2017-07-13 LAB — KAPPA/LAMBDA LIGHT CHAINS
KAPPA, LAMDA LIGHT CHAIN RATIO: 1339.78 — AB (ref 0.26–1.65)
Kappa free light chain: 2411.6 mg/L — ABNORMAL HIGH (ref 3.3–19.4)
LAMDA FREE LIGHT CHAINS: 1.8 mg/L — AB (ref 5.7–26.3)

## 2017-07-13 LAB — LACTATE DEHYDROGENASE: LDH: 250 U/L — ABNORMAL HIGH (ref 125–245)

## 2017-07-13 MED ORDER — PALONOSETRON HCL INJECTION 0.25 MG/5ML
INTRAVENOUS | Status: AC
  Filled 2017-07-13: qty 5

## 2017-07-13 MED ORDER — SODIUM CHLORIDE 0.9 % IV SOLN
Freq: Once | INTRAVENOUS | Status: AC
  Administered 2017-07-13: 13:00:00 via INTRAVENOUS

## 2017-07-13 MED ORDER — BORTEZOMIB CHEMO SQ INJECTION 3.5 MG (2.5MG/ML)
1.5000 mg/m2 | Freq: Once | INTRAMUSCULAR | Status: AC
  Administered 2017-07-13: 2 mg via SUBCUTANEOUS
  Filled 2017-07-13: qty 2

## 2017-07-13 MED ORDER — SODIUM CHLORIDE 0.9 % IV SOLN
300.0000 mg/m2 | Freq: Once | INTRAVENOUS | Status: AC
  Administered 2017-07-13: 420 mg via INTRAVENOUS
  Filled 2017-07-13: qty 21

## 2017-07-13 MED ORDER — PALONOSETRON HCL INJECTION 0.25 MG/5ML
0.2500 mg | Freq: Once | INTRAVENOUS | Status: AC
  Administered 2017-07-13: 0.25 mg via INTRAVENOUS

## 2017-07-13 NOTE — Progress Notes (Signed)
OK to treat with labs from 07/12/17 per Dr. Marin Olp.  Dr. Marin Olp aware of pt.'s BP today.  Order received to give pt all of 250 ml NS bag.    250 ml of NS given.

## 2017-07-13 NOTE — Progress Notes (Signed)
Dr. Marin Olp has reviewed CBC and CMET from 07/12/17. Okay to treat today.

## 2017-07-17 LAB — PROTEIN ELECTROPHORESIS, SERUM, WITH REFLEX
A/G RATIO SPE: 1.3 (ref 0.7–1.7)
Albumin ELP: 3.5 g/dL (ref 2.9–4.4)
Alpha-1-Globulin: 0.4 g/dL (ref 0.0–0.4)
Alpha-2-Globulin: 1.1 g/dL — ABNORMAL HIGH (ref 0.4–1.0)
BETA GLOBULIN: 1 g/dL (ref 0.7–1.3)
GLOBULIN, TOTAL: 2.6 g/dL (ref 2.2–3.9)
Gamma Globulin: 0.2 g/dL — ABNORMAL LOW (ref 0.4–1.8)
M-Spike, %: 0.1 g/dL — ABNORMAL HIGH
SPEP Interpretation: 0
Total Protein ELP: 6.1 g/dL (ref 6.0–8.5)

## 2017-07-17 LAB — IMMUNOFIXATION REFLEX, SERUM
IGG (IMMUNOGLOBIN G), SERUM: 200 mg/dL — AB (ref 700–1600)
IgM (Immunoglobulin M), Srm: 5 mg/dL — ABNORMAL LOW (ref 26–217)

## 2017-07-19 ENCOUNTER — Telehealth: Payer: Self-pay

## 2017-07-19 ENCOUNTER — Inpatient Hospital Stay: Payer: Medicare Other

## 2017-07-19 ENCOUNTER — Other Ambulatory Visit: Payer: Self-pay | Admitting: Family

## 2017-07-19 VITALS — BP 101/60 | HR 72 | Temp 97.7°F | Resp 18

## 2017-07-19 DIAGNOSIS — Z5112 Encounter for antineoplastic immunotherapy: Secondary | ICD-10-CM | POA: Diagnosis not present

## 2017-07-19 DIAGNOSIS — C9000 Multiple myeloma not having achieved remission: Secondary | ICD-10-CM

## 2017-07-19 LAB — CBC WITH DIFFERENTIAL (CANCER CENTER ONLY)
BASOS ABS: 0 10*3/uL (ref 0.0–0.1)
BASOS PCT: 0 %
EOS PCT: 0 %
Eosinophils Absolute: 0 10*3/uL (ref 0.0–0.5)
HEMATOCRIT: 20.6 % — AB (ref 34.8–46.6)
Hemoglobin: 6.8 g/dL — CL (ref 11.6–15.9)
Lymphocytes Relative: 25 %
Lymphs Abs: 1.5 10*3/uL (ref 0.9–3.3)
MCH: 30.5 pg (ref 26.0–34.0)
MCHC: 33 g/dL (ref 32.0–36.0)
MCV: 92.4 fL (ref 81.0–101.0)
MONOS PCT: 10 %
Monocytes Absolute: 0.6 10*3/uL (ref 0.1–0.9)
Neutro Abs: 3.9 10*3/uL (ref 1.5–6.5)
Neutrophils Relative %: 65 %
PLATELETS: 115 10*3/uL — AB (ref 145–400)
RBC: 2.23 MIL/uL — ABNORMAL LOW (ref 3.70–5.32)
RDW: 15.4 % (ref 11.1–15.7)
WBC Count: 6 10*3/uL (ref 3.9–10.0)

## 2017-07-19 LAB — CMP (CANCER CENTER ONLY)
ALBUMIN: 3.5 g/dL (ref 3.5–5.0)
ALT: 22 U/L (ref 10–47)
AST: 27 U/L (ref 11–38)
Alkaline Phosphatase: 52 U/L (ref 26–84)
Anion gap: 11 (ref 5–15)
BUN: 55 mg/dL — AB (ref 7–22)
CALCIUM: 8.9 mg/dL (ref 8.0–10.3)
CO2: 27 mmol/L (ref 18–33)
CREATININE: 1.9 mg/dL — AB (ref 0.60–1.20)
Chloride: 101 mmol/L (ref 98–108)
GLUCOSE: 119 mg/dL — AB (ref 73–118)
Potassium: 3.5 mmol/L (ref 3.3–4.7)
SODIUM: 139 mmol/L (ref 128–145)
TOTAL PROTEIN: 6.4 g/dL (ref 6.4–8.1)
Total Bilirubin: 0.7 mg/dL (ref 0.2–1.6)

## 2017-07-19 LAB — SAMPLE TO BLOOD BANK

## 2017-07-19 MED ORDER — PALONOSETRON HCL INJECTION 0.25 MG/5ML
0.2500 mg | Freq: Once | INTRAVENOUS | Status: AC
  Administered 2017-07-19: 0.25 mg via INTRAVENOUS

## 2017-07-19 MED ORDER — PALONOSETRON HCL INJECTION 0.25 MG/5ML
INTRAVENOUS | Status: AC
  Filled 2017-07-19: qty 5

## 2017-07-19 MED ORDER — SODIUM CHLORIDE 0.9 % IV SOLN
Freq: Once | INTRAVENOUS | Status: AC
  Administered 2017-07-19: 15:00:00 via INTRAVENOUS

## 2017-07-19 MED ORDER — DARBEPOETIN ALFA 300 MCG/0.6ML IJ SOSY
PREFILLED_SYRINGE | INTRAMUSCULAR | Status: AC
  Filled 2017-07-19: qty 0.6

## 2017-07-19 MED ORDER — DARBEPOETIN ALFA 300 MCG/0.6ML IJ SOSY
300.0000 ug | PREFILLED_SYRINGE | Freq: Once | INTRAMUSCULAR | Status: AC
  Administered 2017-07-19: 300 ug via SUBCUTANEOUS

## 2017-07-19 MED ORDER — DEXAMETHASONE 4 MG PO TABS
20.0000 mg | ORAL_TABLET | Freq: Once | ORAL | Status: AC
  Administered 2017-07-19: 20 mg via ORAL

## 2017-07-19 MED ORDER — SODIUM CHLORIDE 0.9 % IV SOLN
300.0000 mg/m2 | Freq: Once | INTRAVENOUS | Status: AC
  Administered 2017-07-19: 420 mg via INTRAVENOUS
  Filled 2017-07-19: qty 21

## 2017-07-19 MED ORDER — BORTEZOMIB CHEMO SQ INJECTION 3.5 MG (2.5MG/ML)
1.5000 mg/m2 | Freq: Once | INTRAMUSCULAR | Status: AC
  Administered 2017-07-19: 2 mg via SUBCUTANEOUS
  Filled 2017-07-19: qty 2

## 2017-07-19 NOTE — Telephone Encounter (Signed)
Dr Marin Olp aware of critical low hgb of 6.8. Per Dr Marin Olp, continue with tx today, add Aranesp 343mcg today and transfuse 2u prbc tomorrow.   Pt reports she does not like the idea of receiving blood products. Pt denies SOB, headache, dizziness, weakness or back pain. States "I have been feeling like I have a lot more energy since last tx."  Dr Marin Olp is comfortable with pt declining blood products as long as Aranesp is given. Pt educated on sx of anemia and is aware to contact the office if sx present. Proceed with tx as planned. dph

## 2017-07-19 NOTE — Patient Instructions (Signed)
[facility] Discharge Instructions for Patients Receiving Chemotherapy  Today you received the following chemotherapy agent velcade, cytoxan.  To help prevent nausea and vomiting after your treatment, we encourage you to take your nausea medication as prescribed by your doctor.   If you develop nausea and vomiting that is not controlled by your nausea medication, call the clinic.   BELOW ARE SYMPTOMS THAT SHOULD BE REPORTED IMMEDIATELY:  *FEVER GREATER THAN 100.5 F  *CHILLS WITH OR WITHOUT FEVER  NAUSEA AND VOMITING THAT IS NOT CONTROLLED WITH YOUR NAUSEA MEDICATION  *UNUSUAL SHORTNESS OF BREATH  *UNUSUAL BRUISING OR BLEEDING  TENDERNESS IN MOUTH AND THROAT WITH OR WITHOUT PRESENCE OF ULCERS  *URINARY PROBLEMS  *BOWEL PROBLEMS  UNUSUAL RASH Items with * indicate a potential emergency and should be followed up as soon as possible.  Feel free to call the clinic should you have any questions or concerns. The clinic phone number is [phone].  Please show the [facility] at check-in to the Emergency Department and triage nurse.

## 2017-07-19 NOTE — Progress Notes (Signed)
OK to treat with today's lab values per Dr. Ennever. 

## 2017-07-20 ENCOUNTER — Telehealth: Payer: Self-pay | Admitting: *Deleted

## 2017-07-20 ENCOUNTER — Other Ambulatory Visit: Payer: Self-pay | Admitting: *Deleted

## 2017-07-20 DIAGNOSIS — C9000 Multiple myeloma not having achieved remission: Secondary | ICD-10-CM

## 2017-07-20 LAB — KAPPA/LAMBDA LIGHT CHAINS
KAPPA FREE LGHT CHN: 2991.8 mg/L — AB (ref 3.3–19.4)
KAPPA, LAMDA LIGHT CHAIN RATIO: UNDETERMINED

## 2017-07-20 NOTE — Telephone Encounter (Signed)
Received a call from the patient's son. Patient was seen in the office yesterday. It was suggested that patient receive a blood transfusion. She refused.  After speaking to her family, the son states the patient has now agreed to a transfusion. They want to schedule a transfusion.  Patient is scheduled for a lab appointment on Friday and a transfusion on Monday.

## 2017-07-21 ENCOUNTER — Other Ambulatory Visit: Payer: Self-pay | Admitting: *Deleted

## 2017-07-21 ENCOUNTER — Inpatient Hospital Stay: Payer: Medicare Other

## 2017-07-21 ENCOUNTER — Other Ambulatory Visit: Payer: Self-pay | Admitting: Family

## 2017-07-21 DIAGNOSIS — Z5112 Encounter for antineoplastic immunotherapy: Secondary | ICD-10-CM | POA: Diagnosis not present

## 2017-07-21 DIAGNOSIS — D649 Anemia, unspecified: Secondary | ICD-10-CM

## 2017-07-21 DIAGNOSIS — C9000 Multiple myeloma not having achieved remission: Secondary | ICD-10-CM

## 2017-07-21 LAB — PREPARE RBC (CROSSMATCH)

## 2017-07-22 LAB — ABO/RH: ABO/RH(D): A POS

## 2017-07-24 ENCOUNTER — Inpatient Hospital Stay: Payer: Medicare Other

## 2017-07-24 DIAGNOSIS — Z5112 Encounter for antineoplastic immunotherapy: Secondary | ICD-10-CM | POA: Diagnosis not present

## 2017-07-24 DIAGNOSIS — C9000 Multiple myeloma not having achieved remission: Secondary | ICD-10-CM

## 2017-07-24 DIAGNOSIS — D649 Anemia, unspecified: Secondary | ICD-10-CM

## 2017-07-24 MED ORDER — DIPHENHYDRAMINE HCL 25 MG PO CAPS
25.0000 mg | ORAL_CAPSULE | Freq: Once | ORAL | Status: AC
  Administered 2017-07-24: 25 mg via ORAL

## 2017-07-24 MED ORDER — SODIUM CHLORIDE 0.9 % IV SOLN
250.0000 mL | Freq: Once | INTRAVENOUS | Status: AC
  Administered 2017-07-24: 250 mL via INTRAVENOUS

## 2017-07-24 MED ORDER — DIPHENHYDRAMINE HCL 25 MG PO CAPS
ORAL_CAPSULE | ORAL | Status: AC
  Filled 2017-07-24: qty 1

## 2017-07-24 MED ORDER — ACETAMINOPHEN 325 MG PO TABS
ORAL_TABLET | ORAL | Status: AC
  Filled 2017-07-24: qty 2

## 2017-07-24 MED ORDER — ACETAMINOPHEN 325 MG PO TABS
650.0000 mg | ORAL_TABLET | Freq: Once | ORAL | Status: AC
  Administered 2017-07-24: 650 mg via ORAL

## 2017-07-24 MED ORDER — FUROSEMIDE 10 MG/ML IJ SOLN: 20.0000 mg | Freq: Once | INTRAMUSCULAR | Status: DC

## 2017-07-24 NOTE — Patient Instructions (Signed)

## 2017-07-25 ENCOUNTER — Encounter: Payer: Self-pay | Admitting: Hematology & Oncology

## 2017-07-25 LAB — TYPE AND SCREEN
ABO/RH(D): A POS
Antibody Screen: NEGATIVE
UNIT DIVISION: 0
Unit division: 0

## 2017-07-25 LAB — BPAM RBC
BLOOD PRODUCT EXPIRATION DATE: 201905192359
Blood Product Expiration Date: 201905192359
ISSUE DATE / TIME: 201904290840
ISSUE DATE / TIME: 201904290840
UNIT TYPE AND RH: 6200
Unit Type and Rh: 6200

## 2017-07-26 ENCOUNTER — Ambulatory Visit: Payer: 59 | Admitting: Hematology & Oncology

## 2017-07-26 ENCOUNTER — Other Ambulatory Visit: Payer: 59

## 2017-07-26 ENCOUNTER — Telehealth: Payer: Self-pay | Admitting: *Deleted

## 2017-07-26 DIAGNOSIS — C9000 Multiple myeloma not having achieved remission: Secondary | ICD-10-CM

## 2017-07-26 MED ORDER — DEXAMETHASONE 4 MG PO TABS: ORAL_TABLET | ORAL | 0 refills | Status: DC

## 2017-07-26 NOTE — Telephone Encounter (Signed)
Patient states that Dr Marin Olp told her to decrease decadron to 12mg , down from 20mg , however the paper she has at home still has 20mg . She wants to clarify what her dose is.  Spoke with Dr Marin Olp who states that the dose should be 12mg . Chart updated and patient aware.

## 2017-08-02 ENCOUNTER — Inpatient Hospital Stay (HOSPITAL_BASED_OUTPATIENT_CLINIC_OR_DEPARTMENT_OTHER): Payer: Medicare Other | Admitting: Hematology & Oncology

## 2017-08-02 ENCOUNTER — Other Ambulatory Visit: Payer: Self-pay

## 2017-08-02 ENCOUNTER — Inpatient Hospital Stay: Payer: Medicare Other

## 2017-08-02 ENCOUNTER — Inpatient Hospital Stay: Payer: Medicare Other | Attending: Hematology & Oncology

## 2017-08-02 ENCOUNTER — Encounter: Payer: Self-pay | Admitting: Hematology & Oncology

## 2017-08-02 VITALS — BP 124/60 | HR 86 | Temp 97.5°F | Resp 17 | Wt 103.0 lb

## 2017-08-02 DIAGNOSIS — Z5111 Encounter for antineoplastic chemotherapy: Secondary | ICD-10-CM | POA: Insufficient documentation

## 2017-08-02 DIAGNOSIS — Z5112 Encounter for antineoplastic immunotherapy: Secondary | ICD-10-CM | POA: Insufficient documentation

## 2017-08-02 DIAGNOSIS — N189 Chronic kidney disease, unspecified: Secondary | ICD-10-CM | POA: Insufficient documentation

## 2017-08-02 DIAGNOSIS — C9002 Multiple myeloma in relapse: Secondary | ICD-10-CM | POA: Diagnosis present

## 2017-08-02 DIAGNOSIS — D631 Anemia in chronic kidney disease: Secondary | ICD-10-CM | POA: Insufficient documentation

## 2017-08-02 DIAGNOSIS — C9000 Multiple myeloma not having achieved remission: Secondary | ICD-10-CM

## 2017-08-02 DIAGNOSIS — N179 Acute kidney failure, unspecified: Secondary | ICD-10-CM | POA: Insufficient documentation

## 2017-08-02 LAB — SAMPLE TO BLOOD BANK

## 2017-08-02 LAB — CBC WITH DIFFERENTIAL (CANCER CENTER ONLY)
Basophils Absolute: 0 10*3/uL (ref 0.0–0.1)
Basophils Relative: 0 %
Eosinophils Absolute: 0 10*3/uL (ref 0.0–0.5)
Eosinophils Relative: 0 %
HEMATOCRIT: 31.6 % — AB (ref 34.8–46.6)
HEMOGLOBIN: 10.2 g/dL — AB (ref 11.6–15.9)
LYMPHS ABS: 3.3 10*3/uL (ref 0.9–3.3)
Lymphocytes Relative: 27 %
MCH: 31.4 pg (ref 26.0–34.0)
MCHC: 32.3 g/dL (ref 32.0–36.0)
MCV: 97.2 fL (ref 81.0–101.0)
MONO ABS: 2.2 10*3/uL — AB (ref 0.1–0.9)
MONOS PCT: 18 %
NEUTROS ABS: 6.8 10*3/uL — AB (ref 1.5–6.5)
NEUTROS PCT: 55 %
Platelet Count: 219 10*3/uL (ref 145–400)
RBC: 3.25 MIL/uL — ABNORMAL LOW (ref 3.70–5.32)
RDW: 18.1 % — AB (ref 11.1–15.7)
WBC Count: 12.3 10*3/uL — ABNORMAL HIGH (ref 3.9–10.0)

## 2017-08-02 LAB — CMP (CANCER CENTER ONLY)
ALK PHOS: 53 U/L (ref 26–84)
ALT: 26 U/L (ref 10–47)
ANION GAP: 6 (ref 5–15)
AST: 25 U/L (ref 11–38)
Albumin: 3.4 g/dL — ABNORMAL LOW (ref 3.5–5.0)
BILIRUBIN TOTAL: 0.8 mg/dL (ref 0.2–1.6)
BUN: 34 mg/dL — ABNORMAL HIGH (ref 7–22)
CHLORIDE: 106 mmol/L (ref 98–108)
CO2: 28 mmol/L (ref 18–33)
CREATININE: 1.7 mg/dL — AB (ref 0.60–1.20)
Calcium: 9.5 mg/dL (ref 8.0–10.3)
Glucose, Bld: 98 mg/dL (ref 73–118)
POTASSIUM: 4.1 mmol/L (ref 3.3–4.7)
Sodium: 140 mmol/L (ref 128–145)
Total Protein: 6.1 g/dL — ABNORMAL LOW (ref 6.4–8.1)

## 2017-08-02 MED ORDER — SODIUM CHLORIDE 0.9 % IV SOLN
300.0000 mg/m2 | Freq: Once | INTRAVENOUS | Status: AC
  Administered 2017-08-02: 420 mg via INTRAVENOUS
  Filled 2017-08-02: qty 21

## 2017-08-02 MED ORDER — BORTEZOMIB CHEMO SQ INJECTION 3.5 MG (2.5MG/ML)
1.5000 mg/m2 | Freq: Once | INTRAMUSCULAR | Status: AC
  Administered 2017-08-02: 2 mg via SUBCUTANEOUS
  Filled 2017-08-02: qty 2

## 2017-08-02 MED ORDER — PALONOSETRON HCL INJECTION 0.25 MG/5ML
INTRAVENOUS | Status: AC
  Filled 2017-08-02: qty 5

## 2017-08-02 MED ORDER — PALONOSETRON HCL INJECTION 0.25 MG/5ML
0.2500 mg | Freq: Once | INTRAVENOUS | Status: AC
  Administered 2017-08-02: 0.25 mg via INTRAVENOUS

## 2017-08-02 MED ORDER — SODIUM CHLORIDE 0.9 % IV SOLN
Freq: Once | INTRAVENOUS | Status: AC
  Administered 2017-08-02: 15:00:00 via INTRAVENOUS

## 2017-08-02 NOTE — Progress Notes (Signed)
Hematology and Oncology Follow Up Visit  Jeanne Martinez [id number] March 11, 1951 67 y.o. 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  She got through her first cycle of chemotherapy nicely.  Her Kappa light chain has come down quite well.  We will refer started her treatment, her Kappa light chain was 8000 mg/dL.  We last checked a couple weeks ago, it was already down to 3000 mg/dL.  She is gaining weight.  She is eating better.  Her renal function is improving.  She still wants to go back to [facility] with her husband.  I told her that she just is not ready to go back yet.  She has had no issues with nausea or vomiting.  We did had to transfuse her last week.  Of note, her erythropoietin level is only 10.  As such, we can certainly use Aranesp if necessary.  She has had no problems with bowels or bladder.  She has had feet swelling.  This is because she eats a lot of soup that is heavy in salt.  Overall, her performance status is ECOG 1.     Medications:  Allergies as of 08/02/2017      Reactions   Milk-related Compounds Anaphylaxis   Throat closes    No Known Allergies       Medication List        Accurate as of 08/02/17  2:46 PM. Always use your most recent med list.          aspirin EC 81 MG tablet Take 81 mg by mouth daily.   cholecalciferol 400 units Tabs tablet Commonly known as:  VITAMIN D Take 800 Units by mouth daily.   dexamethasone 4 MG tablet Commonly known as:  DECADRON TAKE 3 TABLETS ONCE WEEKLY WITH FOOD   famciclovir 250 MG tablet Commonly known as:  FAMVIR Take 1 tablet (250 mg total) by mouth daily.   magnesium oxide 400 MG tablet Commonly known as:  MAG-OX Take 400 mg by mouth daily.   multivitamin  tablet Take 1 tablet by mouth daily.       Allergies:  Allergies  Allergen Reactions  . Milk-Related Compounds Anaphylaxis    Throat closes   . No Known Allergies     Past Medical History, Surgical history, Social history, and Family History were reviewed and updated.  Review of Systems: Review of Systems  Constitutional: Negative.   HENT: Negative.   Eyes: Negative.   Respiratory: Negative.   Cardiovascular: Negative.   Gastrointestinal: Negative.   Genitourinary: Negative.   Musculoskeletal: Negative.   Skin: Negative.   Neurological: Negative.   Endo/Heme/Allergies: Negative.   Psychiatric/Behavioral: Negative.     Physical Exam:  weight is 103 lb (46.7 kg). Her oral temperature is 97.5 F (36.4 C) (abnormal). Her blood pressure is 124/60 and her pulse is 86. Her respiration is 17 and oxygen saturation is 100%.   Wt Readings from Last 3 Encounters:  08/02/17 103 lb (46.7 kg)  07/04/17 98 lb 12 oz (44.8 kg)  06/14/17 108 lb 12.8 oz (49.4 kg)    Physical Exam  Constitutional: She is oriented to person, place, and time.  HENT:  Head: Normocephalic and atraumatic.  Mouth/Throat: Oropharynx is clear and moist.  Eyes:  Pupils are equal, round, and reactive to light. EOM are normal.  Neck: Normal range of motion.  Cardiovascular: Normal rate, regular rhythm and normal heart sounds.  Pulmonary/Chest: Effort normal and breath sounds normal.  Abdominal: Soft. Bowel sounds are normal.  Musculoskeletal: Normal range of motion. She exhibits no edema, tenderness or deformity.  Lymphadenopathy:    She has no cervical adenopathy.  Neurological: She is alert and oriented to person, place, and time.  Skin: Skin is warm and dry. No rash noted. No erythema.  Psychiatric: She has a normal mood and affect. Her behavior is normal. Judgment and thought content normal.  Vitals reviewed.    Lab Results  Component Value Date   WBC 12.3 (H) 08/02/2017   HGB 10.2 (L) 08/02/2017    HCT 31.6 (L) 08/02/2017   MCV 97.2 08/02/2017   PLT 219 08/02/2017   Lab Results  Component Value Date   FERRITIN 220 03/17/2017   IRON 82 03/17/2017   TIBC 303 03/17/2017   UIBC 221 03/17/2017   IRONPCTSAT 27 03/17/2017   Lab Results  Component Value Date   RBC 3.25 (L) 08/02/2017   Lab Results  Component Value Date   KPAFRELGTCHN 2,991.8 (H) 07/19/2017   LAMBDASER <1.5 (L) 07/19/2017   KAPLAMBRATIO UNABLE TO CALCULATE 07/19/2017   Lab Results  Component Value Date   IGGSERUM 200 (L) 07/12/2017   IGA <5 (L) 07/12/2017   IGMSERUM 5 (L) 07/12/2017   Lab Results  Component Value Date   TOTALPROTELP 6.1 07/12/2017   ALBUMINELP 3.5 07/12/2017   A1GS 0.4 07/12/2017   A2GS 1.1 (H) 07/12/2017   BETS 1.0 07/12/2017   GAMS 0.2 (L) 07/12/2017   MSPIKE 0.1 (H) 07/12/2017     Chemistry      Component Value Date/Time   NA 140 08/02/2017 1345   NA 148 (H) 03/17/2017 1210   K 4.1 08/02/2017 1345   K 4.4 03/17/2017 1210   CL 106 08/02/2017 1345   CL 106 03/17/2017 1210   CO2 28 08/02/2017 1345   CO2 27 03/17/2017 1210   BUN 34 (H) 08/02/2017 1345   BUN 23 (H) 03/17/2017 1210   CREATININE 1.70 (H) 08/02/2017 1345   CREATININE 1.1 03/17/2017 1210      Component Value Date/Time   CALCIUM 9.5 08/02/2017 1345   CALCIUM 9.1 03/17/2017 1210   ALKPHOS 53 08/02/2017 1345   ALKPHOS 52 03/17/2017 1210   AST 25 08/02/2017 1345   ALT 26 08/02/2017 1345   ALT 21 03/17/2017 1210   BILITOT 0.8 08/02/2017 1345      Impression and Plan: Jeanne Martinez is a very pleasant 67 yo caucasian female with IgA kappa myeloma with 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  He will be very interesting to see what her Kappa light chain level is.  Hopefully, it will continue to go down.  If not, I think we will have to make a big change and get her on to Kyprolis.  I do not see any other option for her that would be amenable to her quality of life and to her overall performance  status.  We will plan to get her back to see her in another few weeks.  They really need to get back up to [facility] before the end of May because of business issues that they need to take care of.  Hopefully, we will be able to get them to go over [place] Day weekend.    I spent about  35 minutes with she and her family.  All of the time was spent face-to-face going over her labs.  She does have an element of dementia so she does forget some things so we have to repeat quite often.  Volanda Napoleon, MD 5/8/20192:46 PM

## 2017-08-02 NOTE — Progress Notes (Signed)
Ok to treat with creatinine today per MD Marin Olp

## 2017-08-02 NOTE — Patient Instructions (Signed)
[facility] Discharge Instructions for Patients Receiving Chemotherapy  Today you received the following chemotherapy agents Velcade and Cytoxan.  To help prevent nausea and vomiting after your treatment, we encourage you to take your nausea medication as indicated by your MD.   If you develop nausea and vomiting that is not controlled by your nausea medication, call the clinic.   BELOW ARE SYMPTOMS THAT SHOULD BE REPORTED IMMEDIATELY:  *FEVER GREATER THAN 100.5 F  *CHILLS WITH OR WITHOUT FEVER  NAUSEA AND VOMITING THAT IS NOT CONTROLLED WITH YOUR NAUSEA MEDICATION  *UNUSUAL SHORTNESS OF BREATH  *UNUSUAL BRUISING OR BLEEDING  TENDERNESS IN MOUTH AND THROAT WITH OR WITHOUT PRESENCE OF ULCERS  *URINARY PROBLEMS  *BOWEL PROBLEMS  UNUSUAL RASH Items with * indicate a potential emergency and should be followed up as soon as possible.  Feel free to call the clinic should you have any questions or concerns. The clinic phone number is [phone].  Please show the [facility] at check-in to the Emergency Department and triage nurse.

## 2017-08-03 LAB — KAPPA/LAMBDA LIGHT CHAINS: Kappa free light chain: 5865.1 mg/L — ABNORMAL HIGH (ref 3.3–19.4)

## 2017-08-03 LAB — IGG, IGA, IGM: IgG (Immunoglobin G), Serum: 149 mg/dL — ABNORMAL LOW (ref 700–1600)

## 2017-08-03 LAB — BETA 2 MICROGLOBULIN, SERUM: BETA 2 MICROGLOBULIN: 4.7 mg/L — AB (ref 0.6–2.4)

## 2017-08-03 LAB — ERYTHROPOIETIN: Erythropoietin: 100.8 m[IU]/mL — ABNORMAL HIGH (ref 2.6–18.5)

## 2017-08-07 LAB — PROTEIN ELECTROPHORESIS, SERUM, WITH REFLEX
A/G Ratio: 1.3 (ref 0.7–1.7)
ALBUMIN ELP: 3.2 g/dL (ref 2.9–4.4)
ALPHA-1-GLOBULIN: 0.4 g/dL (ref 0.0–0.4)
ALPHA-2-GLOBULIN: 1 g/dL (ref 0.4–1.0)
BETA GLOBULIN: 0.9 g/dL (ref 0.7–1.3)
Gamma Globulin: 0.2 g/dL — ABNORMAL LOW (ref 0.4–1.8)
Globulin, Total: 2.5 g/dL (ref 2.2–3.9)
M-Spike, %: 0.1 g/dL — ABNORMAL HIGH
SPEP INTERP: 0
Total Protein ELP: 5.7 g/dL — ABNORMAL LOW (ref 6.0–8.5)

## 2017-08-07 LAB — IMMUNOFIXATION REFLEX, SERUM
IgA: <5 mg/dL — ABNORMAL LOW (ref 87–352)
IgG (Immunoglobin G), Serum: 150 mg/dL — ABNORMAL LOW (ref 700–1600)

## 2017-08-08 ENCOUNTER — Encounter: Payer: Self-pay | Admitting: Hematology & Oncology

## 2017-08-08 ENCOUNTER — Inpatient Hospital Stay (HOSPITAL_BASED_OUTPATIENT_CLINIC_OR_DEPARTMENT_OTHER): Payer: Medicare Other | Admitting: Hematology & Oncology

## 2017-08-08 VITALS — BP 108/61 | HR 88 | Temp 98.4°F | Resp 16 | Wt 94.2 lb

## 2017-08-08 DIAGNOSIS — Z5112 Encounter for antineoplastic immunotherapy: Secondary | ICD-10-CM | POA: Diagnosis not present

## 2017-08-08 DIAGNOSIS — D631 Anemia in chronic kidney disease: Secondary | ICD-10-CM

## 2017-08-08 DIAGNOSIS — C9000 Multiple myeloma not having achieved remission: Secondary | ICD-10-CM

## 2017-08-08 DIAGNOSIS — N183 Chronic kidney disease, stage 3 unspecified: Secondary | ICD-10-CM | POA: Insufficient documentation

## 2017-08-08 DIAGNOSIS — Z7189 Other specified counseling: Secondary | ICD-10-CM

## 2017-08-08 DIAGNOSIS — D500 Iron deficiency anemia secondary to blood loss (chronic): Secondary | ICD-10-CM

## 2017-08-08 DIAGNOSIS — Z515 Encounter for palliative care: Secondary | ICD-10-CM | POA: Insufficient documentation

## 2017-08-08 DIAGNOSIS — D8989 Other specified disorders involving the immune mechanism, not elsewhere classified: Secondary | ICD-10-CM

## 2017-08-08 DIAGNOSIS — N179 Acute kidney failure, unspecified: Secondary | ICD-10-CM | POA: Diagnosis not present

## 2017-08-08 HISTORY — DX: Anemia in chronic kidney disease: D63.1

## 2017-08-08 HISTORY — DX: Other specified counseling: Z71.89

## 2017-08-08 HISTORY — DX: Chronic kidney disease, stage 3 unspecified: N18.30

## 2017-08-08 HISTORY — DX: Other specified disorders involving the immune mechanism, not elsewhere classified: D89.89

## 2017-08-08 NOTE — Progress Notes (Signed)
Hematology and Oncology Follow Up Visit  OZIE DIMARIA [id number] 09-26-1950 67 y.o. 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 Buccellato is here today for an early visit.  I had to call her to come in early.  Unfortunately, her Kappa [place] is going up quickly.  When we last saw her just last week, her Kappa light chain went up to almost 600 milligrams per deciliter.  The prior value was 300 mg/dL.  It is apparent that her myeloma is becoming resistant to the Velcade.  Not sure as to why this is becoming resistant so quickly.  Prior to this, she was doing quite well.  As such, we have to make a change.  I know that she and her husband really want to get back up to [facility].  However, I just do not see that this will happen right now.  If her light chain level continues to increase, then I think she would run into problems with renal failure.  She is not the easiest patient for peripheral IV access.  She does have good veins.  She sometimes gets dehydrated and the veins do not present themselves.  As such, I talked to her about a Port-A-Cath.  I explained what a Port-A-Cath was.  She would prefer not to have one for right now.  Her appetite is doing fairly well.  She is had no nausea or vomiting.  She has had no rashes.  She does have some swelling about her ankles.  Overall, her performance status is ECOG 2.     Medications:  Allergies as of 08/08/2017      Reactions   Milk-related Compounds Anaphylaxis   Throat closes    No Known Allergies       Medication List        Accurate as of 08/08/17  5:39 PM. Always use your most recent med list.          aspirin EC 81 MG tablet Take 81 mg  by mouth daily.   dexamethasone 4 MG tablet Commonly known as:  DECADRON TAKE 3 TABLETS ONCE WEEKLY WITH FOOD   famciclovir 250 MG tablet Commonly known as:  FAMVIR Take 1 tablet (250 mg total) by mouth daily.   magnesium oxide 400 MG tablet Commonly known as:  MAG-OX Take 400 mg by mouth daily.   multivitamin tablet Take 1 tablet by mouth daily.   Vitamin D 2000 units tablet Take 2,000 Units by mouth daily.       Allergies:  Allergies  Allergen Reactions  . Milk-Related Compounds Anaphylaxis    Throat closes   . No Known Allergies     Past Medical History, Surgical history, Social history, and Family History were reviewed and updated.  Review of Systems: Review of Systems  Constitutional: Negative.   HENT: Negative.   Eyes: Negative.   Respiratory: Negative.   Cardiovascular: Negative.   Gastrointestinal: Negative.   Genitourinary: Negative.   Musculoskeletal: Negative.   Skin: Negative.   Neurological: Negative.   Endo/Heme/Allergies: Negative.   Psychiatric/Behavioral: Negative.     Physical Exam:  weight is 94 lb 4 oz (42.8 kg). Her oral temperature is 98.4 F (36.9 C). Her blood pressure is 108/61 and her  pulse is 88. Her respiration is 16 and oxygen saturation is 98%.   Wt Readings from Last 3 Encounters:  08/08/17 94 lb 4 oz (42.8 kg)  08/02/17 103 lb (46.7 kg)  07/04/17 98 lb 12 oz (44.8 kg)    Physical Exam  Constitutional: She is oriented to person, place, and time.  HENT:  Head: Normocephalic and atraumatic.  Mouth/Throat: Oropharynx is clear and moist.  Eyes: Pupils are equal, round, and reactive to light. EOM are normal.  Neck: Normal range of motion.  Cardiovascular: Normal rate, regular rhythm and normal heart sounds.  Pulmonary/Chest: Effort normal and breath sounds normal.  Abdominal: Soft. Bowel sounds are normal.  Musculoskeletal: Normal range of motion. She exhibits no edema, tenderness or deformity.  Lymphadenopathy:    She  has no cervical adenopathy.  Neurological: She is alert and oriented to person, place, and time.  Skin: Skin is warm and dry. No rash noted. No erythema.  Psychiatric: She has a normal mood and affect. Her behavior is normal. Judgment and thought content normal.  Vitals reviewed.    Lab Results  Component Value Date   WBC 12.3 (H) 08/02/2017   HGB 10.2 (L) 08/02/2017   HCT 31.6 (L) 08/02/2017   MCV 97.2 08/02/2017   PLT 219 08/02/2017   Lab Results  Component Value Date   FERRITIN 220 03/17/2017   IRON 82 03/17/2017   TIBC 303 03/17/2017   UIBC 221 03/17/2017   IRONPCTSAT 27 03/17/2017   Lab Results  Component Value Date   RBC 3.25 (L) 08/02/2017   Lab Results  Component Value Date   KPAFRELGTCHN 5,865.1 (H) 08/02/2017   LAMBDASER <1.5 (L) 08/02/2017   KAPLAMBRATIO See below. 08/02/2017   Lab Results  Component Value Date   IGGSERUM 149 (L) 08/02/2017   IGGSERUM 150 (L) 08/02/2017   IGA <5 (L) 08/02/2017   IGA <5 (L) 08/02/2017   IGMSERUM <5 (L) 08/02/2017   IGMSERUM <5 (L) 08/02/2017   Lab Results  Component Value Date   TOTALPROTELP 5.7 (L) 08/02/2017   ALBUMINELP 3.2 08/02/2017   A1GS 0.4 08/02/2017   A2GS 1.0 08/02/2017   BETS 0.9 08/02/2017   GAMS 0.2 (L) 08/02/2017   MSPIKE 0.1 (H) 08/02/2017     Chemistry      Component Value Date/Time   NA 140 08/02/2017 1345   NA 148 (H) 03/17/2017 1210   K 4.1 08/02/2017 1345   K 4.4 03/17/2017 1210   CL 106 08/02/2017 1345   CL 106 03/17/2017 1210   CO2 28 08/02/2017 1345   CO2 27 03/17/2017 1210   BUN 34 (H) 08/02/2017 1345   BUN 23 (H) 03/17/2017 1210   CREATININE 1.70 (H) 08/02/2017 1345   CREATININE 1.1 03/17/2017 1210      Component Value Date/Time   CALCIUM 9.5 08/02/2017 1345   CALCIUM 9.1 03/17/2017 1210   ALKPHOS 53 08/02/2017 1345   ALKPHOS 52 03/17/2017 1210   AST 25 08/02/2017 1345   ALT 26 08/02/2017 1345   ALT 21 03/17/2017 1210   BILITOT 0.8 08/02/2017 1345      Impression and  Plan: Ms. Yeats is a very pleasant 67 yo caucasian female with IgA kappa myeloma with 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  Switch her over to Kyprolis.  I think that we probably would have to do twice a week Kyprolis for her because she is a little bit tenuous with her performance status.  Her daughter was with her.  She took a lot of notes.  I gave her information about Kyprolis.  I went over the side effects of Kyprolis.  I told her that we will have to watch out for her blood counts.  We would have to be careful with possible of diarrhea.  We need to watch out for fluid overload.   Thankfully, we can get started tomorrow.  Again we will treat her 3 weeks on and one-week off.  I spent about 45 minutes with Ms. Kilduff and her husband and daughter.  I answered all their questions.  Over half the time was spent face-to-face with them.    Volanda Napoleon, MD 5/14/20195:39 PM

## 2017-08-08 NOTE — Progress Notes (Signed)
START ON PATHWAY REGIMEN - Multiple Myeloma and Other Plasma Cell Dyscrasias     A cycle is every 28 days:     Dexamethasone      Cyclophosphamide      Carfilzomib      Carfilzomib      Carfilzomib   **Always confirm dose/schedule in your pharmacy ordering system**    Patient Characteristics: Relapsed / Refractory, All Lines of Therapy R-ISS Staging: Not Applicable Disease Classification: Relapsed Line of Therapy: Third Line Intent of Therapy: Non-Curative / Palliative Intent, Discussed with Patient

## 2017-08-09 ENCOUNTER — Other Ambulatory Visit: Payer: Self-pay | Admitting: *Deleted

## 2017-08-09 ENCOUNTER — Inpatient Hospital Stay: Payer: Medicare Other

## 2017-08-09 ENCOUNTER — Ambulatory Visit: Payer: Medicare Other | Admitting: Hematology & Oncology

## 2017-08-09 ENCOUNTER — Other Ambulatory Visit: Payer: Self-pay

## 2017-08-09 VITALS — BP 116/61 | HR 87 | Temp 97.6°F

## 2017-08-09 DIAGNOSIS — C9000 Multiple myeloma not having achieved remission: Secondary | ICD-10-CM

## 2017-08-09 DIAGNOSIS — D8989 Other specified disorders involving the immune mechanism, not elsewhere classified: Secondary | ICD-10-CM

## 2017-08-09 DIAGNOSIS — Z5112 Encounter for antineoplastic immunotherapy: Secondary | ICD-10-CM | POA: Diagnosis not present

## 2017-08-09 LAB — CBC WITH DIFFERENTIAL (CANCER CENTER ONLY)
BASOS PCT: 0 %
Basophils Absolute: 0 10*3/uL (ref 0.0–0.1)
EOS ABS: 0 10*3/uL (ref 0.0–0.5)
EOS PCT: 0 %
HCT: 33.4 % — ABNORMAL LOW (ref 34.8–46.6)
Hemoglobin: 11.1 g/dL — ABNORMAL LOW (ref 11.6–15.9)
LYMPHS ABS: 0.6 10*3/uL — AB (ref 0.9–3.3)
Lymphocytes Relative: 16 %
MCH: 31.7 pg (ref 26.0–34.0)
MCHC: 33.2 g/dL (ref 32.0–36.0)
MCV: 95.4 fL (ref 81.0–101.0)
MONOS PCT: 18 %
Monocytes Absolute: 0.7 10*3/uL (ref 0.1–0.9)
Neutro Abs: 2.5 10*3/uL (ref 1.5–6.5)
Neutrophils Relative %: 66 %
PLATELETS: 124 10*3/uL — AB (ref 145–400)
RBC: 3.5 MIL/uL — ABNORMAL LOW (ref 3.70–5.32)
RDW: 17.3 % — ABNORMAL HIGH (ref 11.1–15.7)
WBC: 3.8 10*3/uL — AB (ref 3.9–10.0)

## 2017-08-09 LAB — CMP (CANCER CENTER ONLY)
ALBUMIN: 3.7 g/dL (ref 3.5–5.0)
ALT: 28 U/L (ref 10–47)
AST: 35 U/L (ref 11–38)
Alkaline Phosphatase: 54 U/L (ref 26–84)
Anion gap: 13 (ref 5–15)
BUN: 55 mg/dL — ABNORMAL HIGH (ref 7–22)
CHLORIDE: 102 mmol/L (ref 98–108)
CO2: 27 mmol/L (ref 18–33)
CREATININE: 2.4 mg/dL — AB (ref 0.60–1.20)
Calcium: 9.1 mg/dL (ref 8.0–10.3)
GLUCOSE: 98 mg/dL (ref 73–118)
POTASSIUM: 3.9 mmol/L (ref 3.3–4.7)
Sodium: 142 mmol/L (ref 128–145)
Total Bilirubin: 0.6 mg/dL (ref 0.2–1.6)
Total Protein: 6.5 g/dL (ref 6.4–8.1)

## 2017-08-09 MED ORDER — LORAZEPAM 0.5 MG PO TABS: 0.5000 mg | ORAL_TABLET | Freq: Four times a day (QID) | ORAL | 0 refills | Status: DC | PRN

## 2017-08-09 MED ORDER — PALONOSETRON HCL INJECTION 0.25 MG/5ML
INTRAVENOUS | Status: AC
  Filled 2017-08-09: qty 5

## 2017-08-09 MED ORDER — ONDANSETRON HCL 8 MG PO TABS: 8.0000 mg | ORAL_TABLET | Freq: Two times a day (BID) | ORAL | 1 refills | Status: DC | PRN

## 2017-08-09 MED ORDER — SODIUM CHLORIDE 0.9 % IV SOLN
10.0000 mg | Freq: Once | INTRAVENOUS | Status: AC
  Administered 2017-08-09: 10 mg via INTRAVENOUS
  Filled 2017-08-09: qty 1

## 2017-08-09 MED ORDER — PALONOSETRON HCL INJECTION 0.25 MG/5ML
0.2500 mg | Freq: Once | INTRAVENOUS | Status: AC
  Administered 2017-08-09: 0.25 mg via INTRAVENOUS

## 2017-08-09 MED ORDER — PROCHLORPERAZINE MALEATE 10 MG PO TABS: 10.0000 mg | ORAL_TABLET | Freq: Four times a day (QID) | ORAL | 1 refills | Status: DC | PRN

## 2017-08-09 MED ORDER — DEXTROSE 5 % IV SOLN
30.0000 mg | Freq: Once | INTRAVENOUS | Status: AC
  Administered 2017-08-09: 30 mg via INTRAVENOUS
  Filled 2017-08-09: qty 15

## 2017-08-09 MED ORDER — SODIUM CHLORIDE 0.9 % IV SOLN
300.0000 mg/m2 | Freq: Once | INTRAVENOUS | Status: AC
  Administered 2017-08-09: 400 mg via INTRAVENOUS
  Filled 2017-08-09: qty 20

## 2017-08-09 MED ORDER — SODIUM CHLORIDE 0.9 % IV SOLN: Freq: Once | INTRAVENOUS | Status: AC

## 2017-08-09 MED ORDER — DEXAMETHASONE SODIUM PHOSPHATE 10 MG/ML IJ SOLN
INTRAMUSCULAR | Status: AC
  Filled 2017-08-09: qty 1

## 2017-08-09 MED ORDER — SODIUM CHLORIDE 0.9 % IV SOLN
Freq: Once | INTRAVENOUS | Status: AC
  Administered 2017-08-09: 14:00:00 via INTRAVENOUS

## 2017-08-09 NOTE — Patient Instructions (Signed)
[facility] Discharge Instructions for Patients Receiving Chemotherapy  Today you received the following chemotherapy agents Kyprolis/Cytoxan To help prevent nausea and vomiting after your treatment, we encourage you to take your nausea medication as prescribed.   If you develop nausea and vomiting that is not controlled by your nausea medication, call the clinic.   BELOW ARE SYMPTOMS THAT SHOULD BE REPORTED IMMEDIATELY:  *FEVER GREATER THAN 100.5 F  *CHILLS WITH OR WITHOUT FEVER  NAUSEA AND VOMITING THAT IS NOT CONTROLLED WITH YOUR NAUSEA MEDICATION  *UNUSUAL SHORTNESS OF BREATH  *UNUSUAL BRUISING OR BLEEDING  TENDERNESS IN MOUTH AND THROAT WITH OR WITHOUT PRESENCE OF ULCERS  *URINARY PROBLEMS  *BOWEL PROBLEMS  UNUSUAL RASH Items with * indicate a potential emergency and should be followed up as soon as possible.  Feel free to call the clinic should you have any questions or concerns. The clinic phone number is [phone].  Please show the [facility] at check-in to the Emergency Department and triage nurse.   Carfilzomib injection (Kyprolis) What is this medicine? CARFILZOMIB (kar FILZ oh mib) targets a specific protein within cancer cells and stops the cancer cells from growing. It is used to treat multiple myeloma. This medicine may be used for other purposes; ask your health care provider or pharmacist if you have questions. COMMON BRAND NAME(S): KYPROLIS What should I tell my health care provider before I take this medicine? They need to know if you have any of these conditions: -heart disease -history of blood clots -irregular heartbeat -kidney disease -liver disease -lung or breathing disease -an unusual or allergic reaction to carfilzomib, or other medicines, foods, dyes, or preservatives -pregnant or trying to get pregnant -breast-feeding How should I use this medicine? This medicine is for injection or infusion into a vein. It is  given by a health care professional in a hospital or clinic setting. Talk to your pediatrician regarding the use of this medicine in children. Special care may be needed. Overdosage: If you think you have taken too much of this medicine contact a poison control center or emergency room at once. NOTE: This medicine is only for you. Do not share this medicine with others. What if I miss a dose? It is important not to miss your dose. Call your doctor or health care professional if you are unable to keep an appointment. What may interact with this medicine? Interactions are not expected. Give your health care provider a list of all the medicines, herbs, non-prescription drugs, or dietary supplements you use. Also tell them if you smoke, drink alcohol, or use illegal drugs. Some items may interact with your medicine. This list may not describe all possible interactions. Give your health care provider a list of all the medicines, herbs, non-prescription drugs, or dietary supplements you use. Also tell them if you smoke, drink alcohol, or use illegal drugs. Some items may interact with your medicine. What should I watch for while using this medicine? Your condition will be monitored carefully while you are receiving this medicine. Report any side effects. Continue your course of treatment even though you feel ill unless your doctor tells you to stop. You may need blood work done while you are taking this medicine. Do not become pregnant while taking this medicine or for at least 30 days after stopping it. Women should inform their doctor if they wish to become pregnant or think they might be pregnant. There is a potential for serious side effects to an unborn  child. Men should not father a child while taking this medicine and for 90 days after stopping it. Talk to your health care professional or pharmacist for more information. Do not breast-feed an infant while taking this medicine. Check with your doctor  or health care professional if you get an attack of severe diarrhea, nausea and vomiting, or if you sweat a lot. The loss of too much body fluid can make it dangerous for you to take this medicine. You may get dizzy. Do not drive, use machinery, or do anything that needs mental alertness until you know how this medicine affects you. Do not stand or sit up quickly, especially if you are an older patient. This reduces the risk of dizzy or fainting spells. What side effects may I notice from receiving this medicine? Side effects that you should report to your doctor or health care professional as soon as possible: -allergic reactions like skin rash, itching or hives, swelling of the face, lips, or tongue -confusion -dizziness -feeling faint or lightheaded -fever or chills -palpitations -seizures -signs and symptoms of bleeding such as bloody or black, tarry stools; red or dark-brown urine; spitting up blood or brown material that looks like coffee grounds; red spots on the skin; unusual bruising or bleeding including from the eye, gums, or nose -signs and symptoms of a blood clot such as breathing problems; changes in vision; chest pain; severe, sudden headache; pain, swelling, warmth in the leg; trouble speaking; sudden numbness or weakness of the face, arm or leg -signs and symptoms of kidney injury like trouble passing urine or change in the amount of urine -signs and symptoms of liver injury like dark yellow or brown urine; general ill feeling or flu-like symptoms; light-colored stools; loss of appetite; nausea; right upper belly pain; unusually weak or tired; yellowing of the eyes or skin Side effects that usually do not require medical attention (report to your doctor or health care professional if they continue or are bothersome): -back pain -cough -diarrhea -headache -muscle cramps -vomiting This list may not describe all possible side effects. Call your doctor for medical advice about  side effects. You may report side effects to FDA at 1-800-FDA-1088. Where should I keep my medicine? This drug is given in a hospital or clinic and will not be stored at home. NOTE: This sheet is a summary. It may not cover all possible information. If you have questions about this medicine, talk to your doctor, pharmacist, or health care provider.  2018 Elsevier/Gold Standard (2015-04-16 13:39:23)   Cyclophosphamide injection (Cytoxan) What is this medicine? CYCLOPHOSPHAMIDE (sye kloe FOSS fa mide) is a chemotherapy drug. It slows the growth of cancer cells. This medicine is used to treat many types of cancer like lymphoma, myeloma, leukemia, breast cancer, and ovarian cancer, to name a few. This medicine may be used for other purposes; ask your health care provider or pharmacist if you have questions. COMMON BRAND NAME(S): Cytoxan, Neosar What should I tell my health care provider before I take this medicine? They need to know if you have any of these conditions: -blood disorders -history of other chemotherapy -infection -kidney disease -liver disease -recent or ongoing radiation therapy -tumors in the bone marrow -an unusual or allergic reaction to cyclophosphamide, other chemotherapy, other medicines, foods, dyes, or preservatives -pregnant or trying to get pregnant -breast-feeding How should I use this medicine? This drug is usually given as an injection into a vein or muscle or by infusion into a vein. It is administered  in a hospital or clinic by a specially trained health care professional. Talk to your pediatrician regarding the use of this medicine in children. Special care may be needed. Overdosage: If you think you have taken too much of this medicine contact a poison control center or emergency room at once. NOTE: This medicine is only for you. Do not share this medicine with others. What if I miss a dose? It is important not to miss your dose. Call your doctor or health  care professional if you are unable to keep an appointment. What may interact with this medicine? This medicine may interact with the following medications: -amiodarone -amphotericin B -azathioprine -certain antiviral medicines for HIV or AIDS such as protease inhibitors (e.g., indinavir, ritonavir) and zidovudine -certain blood pressure medications such as benazepril, captopril, enalapril, fosinopril, lisinopril, moexipril, monopril, perindopril, quinapril, ramipril, trandolapril -certain cancer medications such as anthracyclines (e.g., daunorubicin, doxorubicin), busulfan, cytarabine, paclitaxel, pentostatin, tamoxifen, trastuzumab -certain diuretics such as chlorothiazide, chlorthalidone, hydrochlorothiazide, indapamide, metolazone -certain medicines that treat or prevent blood clots like warfarin -certain muscle relaxants such as succinylcholine -cyclosporine -etanercept -indomethacin -medicines to increase blood counts like filgrastim, pegfilgrastim, sargramostim -medicines used as general anesthesia -metronidazole -natalizumab This list may not describe all possible interactions. Give your health care provider a list of all the medicines, herbs, non-prescription drugs, or dietary supplements you use. Also tell them if you smoke, drink alcohol, or use illegal drugs. Some items may interact with your medicine. What should I watch for while using this medicine? Visit your doctor for checks on your progress. This drug may make you feel generally unwell. This is not uncommon, as chemotherapy can affect healthy cells as well as cancer cells. Report any side effects. Continue your course of treatment even though you feel ill unless your doctor tells you to stop. Drink water or other fluids as directed. Urinate often, even at night. In some cases, you may be given additional medicines to help with side effects. Follow all directions for their use. Call your doctor or health care professional  for advice if you get a fever, chills or sore throat, or other symptoms of a cold or flu. Do not treat yourself. This drug decreases your body's ability to fight infections. Try to avoid being around people who are sick. This medicine may increase your risk to bruise or bleed. Call your doctor or health care professional if you notice any unusual bleeding. Be careful brushing and flossing your teeth or using a toothpick because you may get an infection or bleed more easily. If you have any dental work done, tell your dentist you are receiving this medicine. You may get drowsy or dizzy. Do not drive, use machinery, or do anything that needs mental alertness until you know how this medicine affects you. Do not become pregnant while taking this medicine or for 1 year after stopping it. Women should inform their doctor if they wish to become pregnant or think they might be pregnant. Men should not father a child while taking this medicine and for 4 months after stopping it. There is a potential for serious side effects to an unborn child. Talk to your health care professional or pharmacist for more information. Do not breast-feed an infant while taking this medicine. This medicine may interfere with the ability to have a child. This medicine has caused ovarian failure in some women. This medicine has caused reduced sperm counts in some men. You should talk with your doctor or health care professional if  you are concerned about your fertility. If you are going to have surgery, tell your doctor or health care professional that you have taken this medicine. What side effects may I notice from receiving this medicine? Side effects that you should report to your doctor or health care professional as soon as possible: -allergic reactions like skin rash, itching or hives, swelling of the face, lips, or tongue -low blood counts - this medicine may decrease the number of white blood cells, red blood cells and  platelets. You may be at increased risk for infections and bleeding. -signs of infection - fever or chills, cough, sore throat, pain or difficulty passing urine -signs of decreased platelets or bleeding - bruising, pinpoint red spots on the skin, black, tarry stools, blood in the urine -signs of decreased red blood cells - unusually weak or tired, fainting spells, lightheadedness -breathing problems -dark urine -dizziness -palpitations -swelling of the ankles, feet, hands -trouble passing urine or change in the amount of urine -weight gain -yellowing of the eyes or skin Side effects that usually do not require medical attention (report to your doctor or health care professional if they continue or are bothersome): -changes in nail or skin color -hair loss -missed menstrual periods -mouth sores -nausea, vomiting This list may not describe all possible side effects. Call your doctor for medical advice about side effects. You may report side effects to FDA at 1-800-FDA-1088. Where should I keep my medicine? This drug is given in a hospital or clinic and will not be stored at home. NOTE: This sheet is a summary. It may not cover all possible information. If you have questions about this medicine, talk to your doctor, pharmacist, or health care provider.  2018 Elsevier/Gold Standard (2012-01-27 16:22:58)

## 2017-08-09 NOTE — Progress Notes (Signed)
Ok to treat with Creatinine 2.4 per Dr. Marin Olp.

## 2017-08-10 ENCOUNTER — Inpatient Hospital Stay: Payer: Medicare Other

## 2017-08-10 DIAGNOSIS — Z5112 Encounter for antineoplastic immunotherapy: Secondary | ICD-10-CM | POA: Diagnosis not present

## 2017-08-10 DIAGNOSIS — C9000 Multiple myeloma not having achieved remission: Secondary | ICD-10-CM

## 2017-08-10 DIAGNOSIS — D8989 Other specified disorders involving the immune mechanism, not elsewhere classified: Secondary | ICD-10-CM

## 2017-08-10 MED ORDER — SODIUM CHLORIDE 0.9 % IV SOLN
Freq: Once | INTRAVENOUS | Status: AC
  Administered 2017-08-10: 14:00:00 via INTRAVENOUS

## 2017-08-10 MED ORDER — DEXAMETHASONE SODIUM PHOSPHATE 10 MG/ML IJ SOLN
INTRAMUSCULAR | Status: AC
  Filled 2017-08-10: qty 1

## 2017-08-10 MED ORDER — DEXAMETHASONE SODIUM PHOSPHATE 100 MG/10ML IJ SOLN
10.0000 mg | Freq: Once | INTRAMUSCULAR | Status: AC
  Administered 2017-08-10: 10 mg via INTRAVENOUS
  Filled 2017-08-10: qty 1

## 2017-08-10 MED ORDER — DEXTROSE 5 % IV SOLN
30.0000 mg | Freq: Once | INTRAVENOUS | Status: AC
  Administered 2017-08-10: 30 mg via INTRAVENOUS
  Filled 2017-08-10: qty 15

## 2017-08-10 NOTE — Patient Instructions (Addendum)
[place] Discharge Instructions for Patients Receiving Chemotherapy  Today you received the following chemotherapy agents Kyprolis   To help prevent nausea and vomiting after your treatment, we encourage you to take your nausea medication   For Nausea or vomiting please take Prochlorperazine (Compazine) by mouth every 6 hours as needed for nausea.  You can also take (Lorazepam)  Ativan by mouth every 8 hours as needed.  Beginning Saturday 08/12/17 you can take (Ondansetron (Zofran) twice daily for nausea or vomiting.   If you develop nausea and vomiting that is not controlled by your nausea medication, call the clinic.   BELOW ARE SYMPTOMS THAT SHOULD BE REPORTED IMMEDIATELY:  *FEVER GREATER THAN 100.5 F  *CHILLS WITH OR WITHOUT FEVER  NAUSEA AND VOMITING THAT IS NOT CONTROLLED WITH YOUR NAUSEA MEDICATION  *UNUSUAL SHORTNESS OF BREATH  *UNUSUAL BRUISING OR BLEEDING  TENDERNESS IN MOUTH AND THROAT WITH OR WITHOUT PRESENCE OF ULCERS  *URINARY PROBLEMS  *BOWEL PROBLEMS  UNUSUAL RASH Items with * indicate a potential emergency and should be followed up as soon as possible.  Feel free to call the clinic should you have any questions or concerns. The clinic phone number is [phone].  Please show the [facility] at check-in to the Emergency Department and triage nurse.           Carfilzomib injection What is this medicine? CARFILZOMIB (kar FILZ oh mib) targets a specific protein within cancer cells and stops the cancer cells from growing. It is used to treat multiple myeloma. This medicine may be used for other purposes; ask your health care provider or pharmacist if you have questions.   COMMON BRAND NAME(S): KYPROLIS What should I tell my health care provider before I take this medicine? They need to know if you have any of these conditions: -heart disease -history of blood clots -irregular heartbeat -kidney disease -liver  disease -lung or breathing disease -an unusual or allergic reaction to carfilzomib, or other medicines, foods, dyes, or preservatives -pregnant or trying to get pregnant -breast-feeding How should I use this medicine? This medicine is for injection or infusion into a vein. It is given by a health care professional in a hospital or clinic setting. Talk to your pediatrician regarding the use of this medicine in children. Special care may be needed. Overdosage: If you think you have taken too much of this medicine contact a poison control center or emergency room at once. NOTE: This medicine is only for you. Do not share this medicine with others. What if I miss a dose? It is important not to miss your dose. Call your doctor or health care professional if you are unable to keep an appointment. What may interact with this medicine? Interactions are not expected. Give your health care provider a list of all the medicines, herbs, non-prescription drugs, or dietary supplements you use. Also tell them if you smoke, drink alcohol, or use illegal drugs. Some items may interact with your medicine. This list may not describe all possible interactions. Give your health care provider a list of all the medicines, herbs, non-prescription drugs, or dietary supplements you use. Also tell them if you smoke, drink alcohol, or use illegal drugs. Some items may interact with your medicine. What should I watch for while using this medicine? Your condition will be monitored carefully while you are receiving this medicine. Report any side effects. Continue your course of treatment even though you feel ill unless your doctor tells you to  stop. You may need blood work done while you are taking this medicine. Do not become pregnant while taking this medicine or for at least 30 days after stopping it. Women should inform their doctor if they wish to become pregnant or think they might be pregnant. There is a potential for  serious side effects to an unborn child. Men should not father a child while taking this medicine and for 90 days after stopping it. Talk to your health care professional or pharmacist for more information. Do not breast-feed an infant while taking this medicine. Check with your doctor or health care professional if you get an attack of severe diarrhea, nausea and vomiting, or if you sweat a lot. The loss of too much body fluid can make it dangerous for you to take this medicine. You may get dizzy. Do not drive, use machinery, or do anything that needs mental alertness until you know how this medicine affects you. Do not stand or sit up quickly, especially if you are an older patient. This reduces the risk of dizzy or fainting spells. What side effects may I notice from receiving this medicine? Side effects that you should report to your doctor or health care professional as soon as possible: -allergic reactions like skin rash, itching or hives, swelling of the face, lips, or tongue -confusion -dizziness -feeling faint or lightheaded -fever or chills -palpitations -seizures -signs and symptoms of bleeding such as bloody or black, tarry stools; red or dark-brown urine; spitting up blood or brown material that looks like coffee grounds; red spots on the skin; unusual bruising or bleeding including from the eye, gums, or nose -signs and symptoms of a blood clot such as breathing problems; changes in vision; chest pain; severe, sudden headache; pain, swelling, warmth in the leg; trouble speaking; sudden numbness or weakness of the face, arm or leg -signs and symptoms of kidney injury like trouble passing urine or change in the amount of urine -signs and symptoms of liver injury like dark yellow or brown urine; general ill feeling or flu-like symptoms; light-colored stools; loss of appetite; nausea; right upper belly pain; unusually weak or tired; yellowing of the eyes or skin Side effects that usually do  not require medical attention (report to your doctor or health care professional if they continue or are bothersome): -back pain -cough -diarrhea -headache -muscle cramps -vomiting This list may not describe all possible side effects. Call your doctor for medical advice about side effects. You may report side effects to FDA at 1-800-FDA-1088. Where should I keep my medicine? This drug is given in a hospital or clinic and will not be stored at home. NOTE: This sheet is a summary. It may not cover all possible information. If you have questions about this medicine, talk to your doctor, pharmacist, or health care provider.  2018 Elsevier/Gold Standard (2015-04-16 13:39:23)

## 2017-08-16 ENCOUNTER — Inpatient Hospital Stay: Payer: Medicare Other

## 2017-08-16 ENCOUNTER — Ambulatory Visit: Payer: 59

## 2017-08-16 ENCOUNTER — Other Ambulatory Visit: Payer: Self-pay

## 2017-08-16 VITALS — BP 98/63 | HR 88 | Temp 97.9°F | Resp 17

## 2017-08-16 DIAGNOSIS — D8989 Other specified disorders involving the immune mechanism, not elsewhere classified: Secondary | ICD-10-CM

## 2017-08-16 DIAGNOSIS — C9000 Multiple myeloma not having achieved remission: Secondary | ICD-10-CM

## 2017-08-16 DIAGNOSIS — Z5112 Encounter for antineoplastic immunotherapy: Secondary | ICD-10-CM | POA: Diagnosis not present

## 2017-08-16 LAB — CBC WITH DIFFERENTIAL (CANCER CENTER ONLY)
BASOS ABS: 0 10*3/uL (ref 0.0–0.1)
Basophils Relative: 0 %
EOS PCT: 0 %
Eosinophils Absolute: 0 10*3/uL (ref 0.0–0.5)
HCT: 34.7 % — ABNORMAL LOW (ref 34.8–46.6)
HEMOGLOBIN: 11.4 g/dL — AB (ref 11.6–15.9)
Lymphocytes Relative: 14 %
Lymphs Abs: 0.7 10*3/uL — ABNORMAL LOW (ref 0.9–3.3)
MCH: 31.3 pg (ref 26.0–34.0)
MCHC: 32.9 g/dL (ref 32.0–36.0)
MCV: 95.3 fL (ref 81.0–101.0)
Monocytes Absolute: 0.5 10*3/uL (ref 0.1–0.9)
Monocytes Relative: 10 %
Neutro Abs: 3.8 10*3/uL (ref 1.5–6.5)
Neutrophils Relative %: 76 %
PLATELETS: 113 10*3/uL — AB (ref 145–400)
RBC: 3.64 MIL/uL — AB (ref 3.70–5.32)
RDW: 16.8 % — ABNORMAL HIGH (ref 11.1–15.7)
WBC: 5 10*3/uL (ref 3.9–10.0)

## 2017-08-16 LAB — CMP (CANCER CENTER ONLY)
ALT: 26 U/L (ref 10–47)
ANION GAP: 11 (ref 5–15)
AST: 24 U/L (ref 11–38)
Albumin: 3.5 g/dL (ref 3.5–5.0)
Alkaline Phosphatase: 62 U/L (ref 26–84)
BUN: 30 mg/dL — AB (ref 7–22)
CHLORIDE: 104 mmol/L (ref 98–108)
CO2: 26 mmol/L (ref 18–33)
Calcium: 9.1 mg/dL (ref 8.0–10.3)
Creatinine: 1.9 mg/dL — ABNORMAL HIGH (ref 0.60–1.20)
GLUCOSE: 213 mg/dL — AB (ref 73–118)
POTASSIUM: 4.6 mmol/L (ref 3.3–4.7)
SODIUM: 141 mmol/L (ref 128–145)
TOTAL PROTEIN: 6.8 g/dL (ref 6.4–8.1)
Total Bilirubin: 0.8 mg/dL (ref 0.2–1.6)

## 2017-08-16 LAB — SAMPLE TO BLOOD BANK

## 2017-08-16 MED ORDER — SODIUM CHLORIDE 0.9 % IV SOLN
10.0000 mg | Freq: Once | INTRAVENOUS | Status: DC
  Administered 2017-08-16: 10 mg via INTRAVENOUS
  Filled 2017-08-16: qty 1

## 2017-08-16 MED ORDER — CARFILZOMIB CHEMO INJECTION 60 MG
36.0000 mg/m2 | Freq: Once | INTRAVENOUS | Status: DC
  Filled 2017-08-16: qty 24

## 2017-08-16 MED ORDER — CYCLOPHOSPHAMIDE CHEMO INJECTION 1 GM
300.0000 mg/m2 | Freq: Once | INTRAMUSCULAR | Status: AC
  Administered 2017-08-16: 400 mg via INTRAVENOUS
  Filled 2017-08-16: qty 20

## 2017-08-16 MED ORDER — PALONOSETRON HCL INJECTION 0.25 MG/5ML
0.2500 mg | Freq: Once | INTRAVENOUS | Status: AC
  Administered 2017-08-16: 0.25 mg via INTRAVENOUS

## 2017-08-16 MED ORDER — SODIUM CHLORIDE 0.9 % IV SOLN
Freq: Once | INTRAVENOUS | Status: AC
  Administered 2017-08-16: 14:00:00 via INTRAVENOUS

## 2017-08-16 MED ORDER — DEXTROSE 5 % IV SOLN
50.0000 mg | Freq: Once | INTRAVENOUS | Status: AC
  Administered 2017-08-16: 50 mg via INTRAVENOUS
  Filled 2017-08-16: qty 10

## 2017-08-16 MED ORDER — DEXAMETHASONE SODIUM PHOSPHATE 10 MG/ML IJ SOLN
INTRAMUSCULAR | Status: AC
  Filled 2017-08-16: qty 1

## 2017-08-16 MED ORDER — SODIUM CHLORIDE 0.9 % IV SOLN: Freq: Once | INTRAVENOUS | Status: AC

## 2017-08-16 NOTE — Patient Instructions (Signed)
Carfilzomib injection What is this medicine? CARFILZOMIB (kar FILZ oh mib) targets a specific protein within cancer cells and stops the cancer cells from growing. It is used to treat multiple myeloma. This medicine may be used for other purposes; ask your health care provider or pharmacist if you have questions. COMMON BRAND NAME(S): KYPROLIS What should I tell my health care provider before I take this medicine? They need to know if you have any of these conditions: -heart disease -history of blood clots -irregular heartbeat -kidney disease -liver disease -lung or breathing disease -an unusual or allergic reaction to carfilzomib, or other medicines, foods, dyes, or preservatives -pregnant or trying to get pregnant -breast-feeding How should I use this medicine? This medicine is for injection or infusion into a vein. It is given by a health care professional in a hospital or clinic setting. Talk to your pediatrician regarding the use of this medicine in children. Special care may be needed. Overdosage: If you think you have taken too much of this medicine contact a poison control center or emergency room at once. NOTE: This medicine is only for you. Do not share this medicine with others. What if I miss a dose? It is important not to miss your dose. Call your doctor or health care professional if you are unable to keep an appointment. What may interact with this medicine? Interactions are not expected. Give your health care provider a list of all the medicines, herbs, non-prescription drugs, or dietary supplements you use. Also tell them if you smoke, drink alcohol, or use illegal drugs. Some items may interact with your medicine. This list may not describe all possible interactions. Give your health care provider a list of all the medicines, herbs, non-prescription drugs, or dietary supplements you use. Also tell them if you smoke, drink alcohol, or use illegal drugs. Some items may  interact with your medicine. What should I watch for while using this medicine? Your condition will be monitored carefully while you are receiving this medicine. Report any side effects. Continue your course of treatment even though you feel ill unless your doctor tells you to stop. You may need blood work done while you are taking this medicine. Do not become pregnant while taking this medicine or for at least 30 days after stopping it. Women should inform their doctor if they wish to become pregnant or think they might be pregnant. There is a potential for serious side effects to an unborn child. Men should not father a child while taking this medicine and for 90 days after stopping it. Talk to your health care professional or pharmacist for more information. Do not breast-feed an infant while taking this medicine. Check with your doctor or health care professional if you get an attack of severe diarrhea, nausea and vomiting, or if you sweat a lot. The loss of too much body fluid can make it dangerous for you to take this medicine. You may get dizzy. Do not drive, use machinery, or do anything that needs mental alertness until you know how this medicine affects you. Do not stand or sit up quickly, especially if you are an older patient. This reduces the risk of dizzy or fainting spells. What side effects may I notice from receiving this medicine? Side effects that you should report to your doctor or health care professional as soon as possible: -allergic reactions like skin rash, itching or hives, swelling of the face, lips, or tongue -confusion -dizziness -feeling faint or lightheaded -fever or chills -  palpitations -seizures -signs and symptoms of bleeding such as bloody or black, tarry stools; red or dark-brown urine; spitting up blood or brown material that looks like coffee grounds; red spots on the skin; unusual bruising or bleeding including from the eye, gums, or nose -signs and symptoms of  a blood clot such as breathing problems; changes in vision; chest pain; severe, sudden headache; pain, swelling, warmth in the leg; trouble speaking; sudden numbness or weakness of the face, arm or leg -signs and symptoms of kidney injury like trouble passing urine or change in the amount of urine -signs and symptoms of liver injury like dark yellow or brown urine; general ill feeling or flu-like symptoms; light-colored stools; loss of appetite; nausea; right upper belly pain; unusually weak or tired; yellowing of the eyes or skin Side effects that usually do not require medical attention (report to your doctor or health care professional if they continue or are bothersome): -back pain -cough -diarrhea -headache -muscle cramps -vomiting This list may not describe all possible side effects. Call your doctor for medical advice about side effects. You may report side effects to FDA at 1-800-FDA-1088. Where should I keep my medicine? This drug is given in a hospital or clinic and will not be stored at home. NOTE: This sheet is a summary. It may not cover all possible information. If you have questions about this medicine, talk to your doctor, pharmacist, or health care provider.  2018 Elsevier/Gold Standard (2015-04-16 13:39:23)  Cyclophosphamide injection What is this medicine? CYCLOPHOSPHAMIDE (sye kloe FOSS fa mide) is a chemotherapy drug. It slows the growth of cancer cells. This medicine is used to treat many types of cancer like lymphoma, myeloma, leukemia, breast cancer, and ovarian cancer, to name a few. This medicine may be used for other purposes; ask your health care provider or pharmacist if you have questions. COMMON BRAND NAME(S): Cytoxan, Neosar What should I tell my health care provider before I take this medicine? They need to know if you have any of these conditions: -blood disorders -history of other chemotherapy -infection -kidney disease -liver disease -recent or ongoing  radiation therapy -tumors in the bone marrow -an unusual or allergic reaction to cyclophosphamide, other chemotherapy, other medicines, foods, dyes, or preservatives -pregnant or trying to get pregnant -breast-feeding How should I use this medicine? This drug is usually given as an injection into a vein or muscle or by infusion into a vein. It is administered in a hospital or clinic by a specially trained health care professional. Talk to your pediatrician regarding the use of this medicine in children. Special care may be needed. Overdosage: If you think you have taken too much of this medicine contact a poison control center or emergency room at once. NOTE: This medicine is only for you. Do not share this medicine with others. What if I miss a dose? It is important not to miss your dose. Call your doctor or health care professional if you are unable to keep an appointment. What may interact with this medicine? This medicine may interact with the following medications: -amiodarone -amphotericin B -azathioprine -certain antiviral medicines for HIV or AIDS such as protease inhibitors (e.g., indinavir, ritonavir) and zidovudine -certain blood pressure medications such as benazepril, captopril, enalapril, fosinopril, lisinopril, moexipril, monopril, perindopril, quinapril, ramipril, trandolapril -certain cancer medications such as anthracyclines (e.g., daunorubicin, doxorubicin), busulfan, cytarabine, paclitaxel, pentostatin, tamoxifen, trastuzumab -certain diuretics such as chlorothiazide, chlorthalidone, hydrochlorothiazide, indapamide, metolazone -certain medicines that treat or prevent blood clots like warfarin -certain   muscle relaxants such as succinylcholine -cyclosporine -etanercept -indomethacin -medicines to increase blood counts like filgrastim, pegfilgrastim, sargramostim -medicines used as general anesthesia -metronidazole -natalizumab This list may not describe all possible  interactions. Give your health care provider a list of all the medicines, herbs, non-prescription drugs, or dietary supplements you use. Also tell them if you smoke, drink alcohol, or use illegal drugs. Some items may interact with your medicine. What should I watch for while using this medicine? Visit your doctor for checks on your progress. This drug may make you feel generally unwell. This is not uncommon, as chemotherapy can affect healthy cells as well as cancer cells. Report any side effects. Continue your course of treatment even though you feel ill unless your doctor tells you to stop. Drink water or other fluids as directed. Urinate often, even at night. In some cases, you may be given additional medicines to help with side effects. Follow all directions for their use. Call your doctor or health care professional for advice if you get a fever, chills or sore throat, or other symptoms of a cold or flu. Do not treat yourself. This drug decreases your body's ability to fight infections. Try to avoid being around people who are sick. This medicine may increase your risk to bruise or bleed. Call your doctor or health care professional if you notice any unusual bleeding. Be careful brushing and flossing your teeth or using a toothpick because you may get an infection or bleed more easily. If you have any dental work done, tell your dentist you are receiving this medicine. You may get drowsy or dizzy. Do not drive, use machinery, or do anything that needs mental alertness until you know how this medicine affects you. Do not become pregnant while taking this medicine or for 1 year after stopping it. Women should inform their doctor if they wish to become pregnant or think they might be pregnant. Men should not father a child while taking this medicine and for 4 months after stopping it. There is a potential for serious side effects to an unborn child. Talk to your health care professional or pharmacist for  more information. Do not breast-feed an infant while taking this medicine. This medicine may interfere with the ability to have a child. This medicine has caused ovarian failure in some women. This medicine has caused reduced sperm counts in some men. You should talk with your doctor or health care professional if you are concerned about your fertility. If you are going to have surgery, tell your doctor or health care professional that you have taken this medicine. What side effects may I notice from receiving this medicine? Side effects that you should report to your doctor or health care professional as soon as possible: -allergic reactions like skin rash, itching or hives, swelling of the face, lips, or tongue -low blood counts - this medicine may decrease the number of white blood cells, red blood cells and platelets. You may be at increased risk for infections and bleeding. -signs of infection - fever or chills, cough, sore throat, pain or difficulty passing urine -signs of decreased platelets or bleeding - bruising, pinpoint red spots on the skin, black, tarry stools, blood in the urine -signs of decreased red blood cells - unusually weak or tired, fainting spells, lightheadedness -breathing problems -dark urine -dizziness -palpitations -swelling of the ankles, feet, hands -trouble passing urine or change in the amount of urine -weight gain -yellowing of the eyes or skin Side effects that   usually do not require medical attention (report to your doctor or health care professional if they continue or are bothersome): -changes in nail or skin color -hair loss -missed menstrual periods -mouth sores -nausea, vomiting This list may not describe all possible side effects. Call your doctor for medical advice about side effects. You may report side effects to FDA at 1-800-FDA-1088. Where should I keep my medicine? This drug is given in a hospital or clinic and will not be stored at  home. NOTE: This sheet is a summary. It may not cover all possible information. If you have questions about this medicine, talk to your doctor, pharmacist, or health care provider.  2018 Elsevier/Gold Standard (2012-01-27 16:22:58)  

## 2017-08-17 ENCOUNTER — Other Ambulatory Visit: Payer: Self-pay

## 2017-08-17 ENCOUNTER — Inpatient Hospital Stay: Payer: Medicare Other

## 2017-08-17 VITALS — BP 107/55 | HR 88 | Temp 98.1°F | Resp 18

## 2017-08-17 DIAGNOSIS — D8989 Other specified disorders involving the immune mechanism, not elsewhere classified: Secondary | ICD-10-CM

## 2017-08-17 DIAGNOSIS — Z5112 Encounter for antineoplastic immunotherapy: Secondary | ICD-10-CM | POA: Diagnosis not present

## 2017-08-17 DIAGNOSIS — C9000 Multiple myeloma not having achieved remission: Secondary | ICD-10-CM

## 2017-08-17 MED ORDER — DEXAMETHASONE SODIUM PHOSPHATE 10 MG/ML IJ SOLN
10.0000 mg | Freq: Once | INTRAMUSCULAR | Status: AC
  Administered 2017-08-17: 10 mg via INTRAVENOUS

## 2017-08-17 MED ORDER — DEXAMETHASONE SODIUM PHOSPHATE 10 MG/ML IJ SOLN
INTRAMUSCULAR | Status: AC
  Filled 2017-08-17: qty 1

## 2017-08-17 MED ORDER — SODIUM CHLORIDE 0.9 % IV SOLN
Freq: Once | INTRAVENOUS | Status: AC
  Administered 2017-08-17: 13:00:00 via INTRAVENOUS

## 2017-08-17 MED ORDER — CARFILZOMIB CHEMO INJECTION 60 MG
36.0000 mg/m2 | Freq: Once | INTRAVENOUS | Status: AC
  Administered 2017-08-17: 48 mg via INTRAVENOUS
  Filled 2017-08-17: qty 15

## 2017-08-17 NOTE — Patient Instructions (Signed)
[facility] Discharge Instructions for Patients Receiving Chemotherapy  Today you received the following chemotherapy agents:  Kyprolis  To help prevent nausea and vomiting after your treatment, we encourage you to take your nausea medication as prescribed.   If you develop nausea and vomiting that is not controlled by your nausea medication, call the clinic.   BELOW ARE SYMPTOMS THAT SHOULD BE REPORTED IMMEDIATELY:  *FEVER GREATER THAN 100.5 F  *CHILLS WITH OR WITHOUT FEVER  NAUSEA AND VOMITING THAT IS NOT CONTROLLED WITH YOUR NAUSEA MEDICATION  *UNUSUAL SHORTNESS OF BREATH  *UNUSUAL BRUISING OR BLEEDING  TENDERNESS IN MOUTH AND THROAT WITH OR WITHOUT PRESENCE OF ULCERS  *URINARY PROBLEMS  *BOWEL PROBLEMS  UNUSUAL RASH Items with * indicate a potential emergency and should be followed up as soon as possible.  Feel free to call the clinic should you have any questions or concerns. The clinic phone number is [phone].  Please show the CHEMO ALERT CARD at check-in to the Emergency Department and triage nurse.   

## 2017-08-23 ENCOUNTER — Inpatient Hospital Stay: Payer: Medicare Other

## 2017-08-23 ENCOUNTER — Inpatient Hospital Stay (HOSPITAL_BASED_OUTPATIENT_CLINIC_OR_DEPARTMENT_OTHER): Payer: Medicare Other | Admitting: Hematology & Oncology

## 2017-08-23 ENCOUNTER — Other Ambulatory Visit: Payer: Self-pay

## 2017-08-23 VITALS — BP 108/56 | HR 69 | Temp 97.9°F | Resp 18 | Wt 94.3 lb

## 2017-08-23 DIAGNOSIS — C9000 Multiple myeloma not having achieved remission: Secondary | ICD-10-CM

## 2017-08-23 DIAGNOSIS — D631 Anemia in chronic kidney disease: Secondary | ICD-10-CM

## 2017-08-23 DIAGNOSIS — C9002 Multiple myeloma in relapse: Secondary | ICD-10-CM | POA: Diagnosis not present

## 2017-08-23 DIAGNOSIS — N189 Chronic kidney disease, unspecified: Secondary | ICD-10-CM | POA: Diagnosis not present

## 2017-08-23 DIAGNOSIS — D500 Iron deficiency anemia secondary to blood loss (chronic): Secondary | ICD-10-CM

## 2017-08-23 DIAGNOSIS — Z7189 Other specified counseling: Secondary | ICD-10-CM

## 2017-08-23 DIAGNOSIS — D8989 Other specified disorders involving the immune mechanism, not elsewhere classified: Secondary | ICD-10-CM

## 2017-08-23 DIAGNOSIS — R7989 Other specified abnormal findings of blood chemistry: Secondary | ICD-10-CM

## 2017-08-23 DIAGNOSIS — N183 Chronic kidney disease, stage 3 unspecified: Secondary | ICD-10-CM

## 2017-08-23 DIAGNOSIS — Z5112 Encounter for antineoplastic immunotherapy: Secondary | ICD-10-CM | POA: Diagnosis not present

## 2017-08-23 LAB — CMP (CANCER CENTER ONLY)
ALT: 26 U/L (ref 10–47)
ANION GAP: 13 (ref 5–15)
AST: 21 U/L (ref 11–38)
Albumin: 3.2 g/dL — ABNORMAL LOW (ref 3.5–5.0)
Alkaline Phosphatase: 61 U/L (ref 26–84)
BILIRUBIN TOTAL: 0.7 mg/dL (ref 0.2–1.6)
BUN: 38 mg/dL — ABNORMAL HIGH (ref 7–22)
CALCIUM: 9.7 mg/dL (ref 8.0–10.3)
CO2: 23 mmol/L (ref 18–33)
Chloride: 106 mmol/L (ref 98–108)
Creatinine: 2.4 mg/dL — ABNORMAL HIGH (ref 0.60–1.20)
Glucose, Bld: 157 mg/dL — ABNORMAL HIGH (ref 73–118)
Potassium: 4.7 mmol/L (ref 3.3–4.7)
Sodium: 142 mmol/L (ref 128–145)
TOTAL PROTEIN: 6.2 g/dL — AB (ref 6.4–8.1)

## 2017-08-23 LAB — CBC WITH DIFFERENTIAL (CANCER CENTER ONLY)
BASOS ABS: 0 10*3/uL (ref 0.0–0.1)
BASOS PCT: 0 %
EOS PCT: 0 %
Eosinophils Absolute: 0 10*3/uL (ref 0.0–0.5)
HCT: 29.3 % — ABNORMAL LOW (ref 34.8–46.6)
Hemoglobin: 9.7 g/dL — ABNORMAL LOW (ref 11.6–15.9)
Lymphocytes Relative: 17 %
Lymphs Abs: 1.5 10*3/uL (ref 0.9–3.3)
MCH: 31.5 pg (ref 26.0–34.0)
MCHC: 33.1 g/dL (ref 32.0–36.0)
MCV: 95.1 fL (ref 81.0–101.0)
MONO ABS: 0.7 10*3/uL (ref 0.1–0.9)
Monocytes Relative: 8 %
Neutro Abs: 6.9 10*3/uL — ABNORMAL HIGH (ref 1.5–6.5)
Neutrophils Relative %: 75 %
Platelet Count: 107 10*3/uL — ABNORMAL LOW (ref 145–400)
RBC: 3.08 MIL/uL — ABNORMAL LOW (ref 3.70–5.32)
RDW: 16.4 % — AB (ref 11.1–15.7)
WBC Count: 9.2 10*3/uL (ref 3.9–10.0)

## 2017-08-23 MED ORDER — PALONOSETRON HCL INJECTION 0.25 MG/5ML
INTRAVENOUS | Status: AC
  Filled 2017-08-23: qty 5

## 2017-08-23 MED ORDER — DARBEPOETIN ALFA 300 MCG/0.6ML IJ SOSY
300.0000 ug | PREFILLED_SYRINGE | Freq: Once | INTRAMUSCULAR | Status: AC
  Administered 2017-08-23: 300 ug via SUBCUTANEOUS

## 2017-08-23 MED ORDER — CEFDINIR 300 MG PO CAPS: 300.0000 mg | ORAL_CAPSULE | Freq: Two times a day (BID) | ORAL | 0 refills | Status: DC

## 2017-08-23 MED ORDER — SODIUM CHLORIDE 0.9 % IV SOLN
300.0000 mg/m2 | Freq: Once | INTRAVENOUS | Status: AC
  Administered 2017-08-23: 400 mg via INTRAVENOUS
  Filled 2017-08-23: qty 20

## 2017-08-23 MED ORDER — DEXAMETHASONE SODIUM PHOSPHATE 10 MG/ML IJ SOLN
INTRAMUSCULAR | Status: AC
  Filled 2017-08-23: qty 1

## 2017-08-23 MED ORDER — PALONOSETRON HCL INJECTION 0.25 MG/5ML
0.2500 mg | Freq: Once | INTRAVENOUS | Status: AC
  Administered 2017-08-23: 0.25 mg via INTRAVENOUS

## 2017-08-23 MED ORDER — SODIUM CHLORIDE 0.9 % IV SOLN
Freq: Once | INTRAVENOUS | Status: AC
  Administered 2017-08-23: 15:00:00 via INTRAVENOUS

## 2017-08-23 MED ORDER — DEXTROSE 5 % IV SOLN
36.0000 mg/m2 | Freq: Once | INTRAVENOUS | Status: AC
  Administered 2017-08-23: 48 mg via INTRAVENOUS
  Filled 2017-08-23: qty 9

## 2017-08-23 MED ORDER — DARBEPOETIN ALFA 300 MCG/0.6ML IJ SOSY
PREFILLED_SYRINGE | INTRAMUSCULAR | Status: AC
  Filled 2017-08-23: qty 0.6

## 2017-08-23 MED ORDER — DEXAMETHASONE SODIUM PHOSPHATE 10 MG/ML IJ SOLN
10.0000 mg | Freq: Once | INTRAMUSCULAR | Status: AC
  Administered 2017-08-23: 10 mg via INTRAVENOUS

## 2017-08-23 NOTE — Patient Instructions (Signed)
[facility] Discharge Instructions for Patients Receiving Chemotherapy  Today you received the following chemotherapy agents Cytoxan, Kyprolis  To help prevent nausea and vomiting after your treatment, we encourage you to take your nausea medication    If you develop nausea and vomiting that is not controlled by your nausea medication, call the clinic.   BELOW ARE SYMPTOMS THAT SHOULD BE REPORTED IMMEDIATELY:  *FEVER GREATER THAN 100.5 F  *CHILLS WITH OR WITHOUT FEVER  NAUSEA AND VOMITING THAT IS NOT CONTROLLED WITH YOUR NAUSEA MEDICATION  *UNUSUAL SHORTNESS OF BREATH  *UNUSUAL BRUISING OR BLEEDING  TENDERNESS IN MOUTH AND THROAT WITH OR WITHOUT PRESENCE OF ULCERS  *URINARY PROBLEMS  *BOWEL PROBLEMS  UNUSUAL RASH Items with * indicate a potential emergency and should be followed up as soon as possible.  Feel free to call the clinic should you have any questions or concerns. The clinic phone number is [phone].  Please show the CHEMO ALERT CARD at check-in to the Emergency Department and triage nurse.   

## 2017-08-23 NOTE — Progress Notes (Signed)
Hematology and Oncology Follow Up Visit  Jeanne Martinez [id number] May 04, 1950 67 y.o. 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  We had a switch her treatment over to Kyprolis/Cytoxan.  This is because her light chain was going up very quickly.  As such, it was obvious that the.  She has had no complaints.  Her weight is up a little bit Ninlaro was not really helping much.  She seems to be doing fairly well.  She has not had any nausea or vomiting.  She really wants to go back up to [facility].  She will have a week off with this treatment.  I think that probably would be okay for them to go back up to [facility] for a week and then come back.  We are checking her light chain levels today.  She still does not want to have a Port-A-Cath placed.  This is something that we are looking into.  I think it is something that would make life little bit easier for her.  She has had no bleeding.  She has a little bit of a sinus infection.  I will call in some Omnicef (300 mg p.o. twice daily x7 days) to try to help.  Overall, her performance status is ECOG 2.     Medications:  Allergies as of 08/23/2017      Reactions   Milk-related Compounds Anaphylaxis   Throat closes    No Known Allergies       Medication List        Accurate as of 08/23/17  3:47 PM. Always use your most recent med list.          aspirin EC 81 MG tablet Take 81 mg by mouth daily.   cefdinir 300 MG capsule Commonly known as:  OMNICEF Take 1 capsule (300 mg total) by mouth 2 (two) times daily.     dexamethasone 4 MG tablet Commonly known as:  DECADRON TAKE 3 TABLETS ONCE WEEKLY WITH FOOD   famciclovir 250 MG tablet Commonly known as:  FAMVIR Take 1 tablet (250 mg total) by mouth daily.   LORazepam 0.5 MG tablet Commonly known as:  ATIVAN Take 1 tablet (0.5 mg total) by mouth every 6 (six) hours as needed (Nausea or vomiting).   magnesium oxide 400 MG tablet Commonly known as:  MAG-OX Take 400 mg by mouth daily.   multivitamin tablet Take 1 tablet by mouth daily.   ondansetron 8 MG tablet Commonly known as:  ZOFRAN Take 1 tablet (8 mg total) by mouth 2 (two) times daily as needed for refractory nausea / vomiting. Take as needed on days 4-7, 11-14, 18-28   prochlorperazine 10 MG tablet Commonly known as:  COMPAZINE Take 1 tablet (10 mg total) by mouth every 6 (six) hours as needed (Nausea or vomiting).   Vitamin D 2000 units tablet Take 2,000 Units by mouth daily.       Allergies:  Allergies  Allergen Reactions  . Milk-Related Compounds Anaphylaxis    Throat closes   . No Known Allergies     Past Medical History, Surgical history,  Social history, and Family History were reviewed and updated.  Review of Systems: Review of Systems  Constitutional: Negative.   HENT: Negative.   Eyes: Negative.   Respiratory: Negative.   Cardiovascular: Negative.   Gastrointestinal: Negative.   Genitourinary: Negative.   Musculoskeletal: Negative.   Skin: Negative.   Neurological: Negative.   Endo/Heme/Allergies: Negative.   Psychiatric/Behavioral: Negative.     Physical Exam:  weight is 94 lb 5 oz (42.8 kg). Her oral temperature is 97.9 F (36.6 C). Her blood pressure is 108/56 (abnormal) and her pulse is 69. Her respiration is 18 and oxygen saturation is 100%.   Wt Readings from Last 3 Encounters:  08/23/17 94 lb 5 oz (42.8 kg)  08/08/17 94 lb 4 oz (42.8 kg)  08/02/17 103 lb (46.7 kg)    Physical Exam  Constitutional: She is oriented to person, place, and  time.  HENT:  Head: Normocephalic and atraumatic.  Mouth/Throat: Oropharynx is clear and moist.  Eyes: Pupils are equal, round, and reactive to light. EOM are normal.  Neck: Normal range of motion.  Cardiovascular: Normal rate, regular rhythm and normal heart sounds.  Pulmonary/Chest: Effort normal and breath sounds normal.  Abdominal: Soft. Bowel sounds are normal.  Musculoskeletal: Normal range of motion. She exhibits no edema, tenderness or deformity.  Lymphadenopathy:    She has no cervical adenopathy.  Neurological: She is alert and oriented to person, place, and time.  Skin: Skin is warm and dry. No rash noted. No erythema.  Psychiatric: She has a normal mood and affect. Her behavior is normal. Judgment and thought content normal.  Vitals reviewed.    Lab Results  Component Value Date   WBC 9.2 08/23/2017   HGB 9.7 (L) 08/23/2017   HCT 29.3 (L) 08/23/2017   MCV 95.1 08/23/2017   PLT 107 (L) 08/23/2017   Lab Results  Component Value Date   FERRITIN 220 03/17/2017   IRON 82 03/17/2017   TIBC 303 03/17/2017   UIBC 221 03/17/2017   IRONPCTSAT 27 03/17/2017   Lab Results  Component Value Date   RBC 3.08 (L) 08/23/2017   Lab Results  Component Value Date   KPAFRELGTCHN 5,865.1 (H) 08/02/2017   LAMBDASER <1.5 (L) 08/02/2017   KAPLAMBRATIO See below. 08/02/2017   Lab Results  Component Value Date   IGGSERUM 149 (L) 08/02/2017   IGGSERUM 150 (L) 08/02/2017   IGA <5 (L) 08/02/2017   IGA <5 (L) 08/02/2017   IGMSERUM <5 (L) 08/02/2017   IGMSERUM <5 (L) 08/02/2017   Lab Results  Component Value Date   TOTALPROTELP 5.7 (L) 08/02/2017   ALBUMINELP 3.2 08/02/2017   A1GS 0.4 08/02/2017   A2GS 1.0 08/02/2017   BETS 0.9 08/02/2017   GAMS 0.2 (L) 08/02/2017   MSPIKE 0.1 (H) 08/02/2017     Chemistry      Component Value Date/Time   NA 142 08/23/2017 1322   NA 148 (H) 03/17/2017 1210   K 4.7 08/23/2017 1322   K 4.4 03/17/2017 1210   CL 106 08/23/2017 1322   CL  106 03/17/2017 1210   CO2 23 08/23/2017 1322   CO2 27 03/17/2017 1210   BUN 38 (H) 08/23/2017 1322   BUN 23 (H) 03/17/2017 1210   CREATININE 2.40 (H) 08/23/2017 1322   CREATININE 1.1 03/17/2017 1210      Component Value Date/Time   CALCIUM 9.7 08/23/2017 1322   CALCIUM 9.1 03/17/2017 1210   ALKPHOS 61 08/23/2017 1322   ALKPHOS 52 03/17/2017 1210  AST 21 08/23/2017 1322   ALT 26 08/23/2017 1322   ALT 21 03/17/2017 1210   BILITOT 0.7 08/23/2017 1322      Impression and Plan: Ms. Sistare is a very pleasant 67 yo caucasian female with IgA kappa myeloma with 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  I have a little bit concerned that her creatinine is back up a little bit.  Hopefully, we will see that her light chains are improving.  If, for some reason, we are not seeing any improvement in her light chains, then we will have to make a switch over to 1 of the monoclonal antibodies.  I must say that Ms. Wimes has a very difficult situation.  I am a little bit surprised by this.  Hopefully, we will see improvement in her light chains and if so, we will be able to let her go back up to [facility] so she can be with her friends.  I would like to see her back in 2 weeks.   Volanda Napoleon, MD 5/29/20193:47 PM

## 2017-08-24 ENCOUNTER — Other Ambulatory Visit: Payer: Self-pay

## 2017-08-24 ENCOUNTER — Inpatient Hospital Stay: Payer: Medicare Other

## 2017-08-24 VITALS — BP 96/58 | HR 83 | Temp 98.1°F | Resp 18

## 2017-08-24 DIAGNOSIS — D8989 Other specified disorders involving the immune mechanism, not elsewhere classified: Secondary | ICD-10-CM

## 2017-08-24 DIAGNOSIS — C9000 Multiple myeloma not having achieved remission: Secondary | ICD-10-CM

## 2017-08-24 DIAGNOSIS — Z5112 Encounter for antineoplastic immunotherapy: Secondary | ICD-10-CM | POA: Diagnosis not present

## 2017-08-24 LAB — IRON AND TIBC
Iron: 98 ug/dL (ref 41–142)
SATURATION RATIOS: 47 % (ref 21–57)
TIBC: 210 ug/dL — AB (ref 236–444)
UIBC: 112 ug/dL

## 2017-08-24 LAB — IGG, IGA, IGM
IGM (IMMUNOGLOBULIN M), SRM: 10 mg/dL — AB (ref 26–217)
IgA: <5 mg/dL — ABNORMAL LOW (ref 87–352)
IgG (Immunoglobin G), Serum: 182 mg/dL — ABNORMAL LOW (ref 700–1600)

## 2017-08-24 LAB — FERRITIN: FERRITIN: 1608 ng/mL — AB (ref 9–269)

## 2017-08-24 LAB — KAPPA/LAMBDA LIGHT CHAINS: Kappa free light chain: 8039.8 mg/L — ABNORMAL HIGH (ref 3.3–19.4)

## 2017-08-24 MED ORDER — DEXAMETHASONE SODIUM PHOSPHATE 10 MG/ML IJ SOLN
INTRAMUSCULAR | Status: AC
  Filled 2017-08-24: qty 1

## 2017-08-24 MED ORDER — DEXTROSE 5 % IV SOLN
36.0000 mg/m2 | Freq: Once | INTRAVENOUS | Status: AC
  Administered 2017-08-24: 48 mg via INTRAVENOUS
  Filled 2017-08-24: qty 24

## 2017-08-24 MED ORDER — DEXAMETHASONE SODIUM PHOSPHATE 10 MG/ML IJ SOLN
10.0000 mg | Freq: Once | INTRAMUSCULAR | Status: AC
  Administered 2017-08-24: 10 mg via INTRAVENOUS

## 2017-08-24 MED ORDER — SODIUM CHLORIDE 0.9 % IV SOLN
Freq: Once | INTRAVENOUS | Status: AC
  Administered 2017-08-24: 13:00:00 via INTRAVENOUS

## 2017-08-24 NOTE — Patient Instructions (Signed)
[facility] Discharge Instructions for Patients Receiving Chemotherapy  Today you received the following chemotherapy agents:  Kyprolis  To help prevent nausea and vomiting after your treatment, we encourage you to take your nausea medication as prescribed.   If you develop nausea and vomiting that is not controlled by your nausea medication, call the clinic.   BELOW ARE SYMPTOMS THAT SHOULD BE REPORTED IMMEDIATELY:  *FEVER GREATER THAN 100.5 F  *CHILLS WITH OR WITHOUT FEVER  NAUSEA AND VOMITING THAT IS NOT CONTROLLED WITH YOUR NAUSEA MEDICATION  *UNUSUAL SHORTNESS OF BREATH  *UNUSUAL BRUISING OR BLEEDING  TENDERNESS IN MOUTH AND THROAT WITH OR WITHOUT PRESENCE OF ULCERS  *URINARY PROBLEMS  *BOWEL PROBLEMS  UNUSUAL RASH Items with * indicate a potential emergency and should be followed up as soon as possible.  Feel free to call the clinic should you have any questions or concerns. The clinic phone number is [phone].  Please show the CHEMO ALERT CARD at check-in to the Emergency Department and triage nurse.   

## 2017-08-28 ENCOUNTER — Encounter: Payer: Self-pay | Admitting: Hematology & Oncology

## 2017-08-28 ENCOUNTER — Other Ambulatory Visit: Payer: Self-pay

## 2017-08-28 ENCOUNTER — Inpatient Hospital Stay: Payer: Medicare Other | Attending: Hematology & Oncology | Admitting: Hematology & Oncology

## 2017-08-28 DIAGNOSIS — N179 Acute kidney failure, unspecified: Secondary | ICD-10-CM | POA: Insufficient documentation

## 2017-08-28 DIAGNOSIS — C9000 Multiple myeloma not having achieved remission: Secondary | ICD-10-CM

## 2017-08-28 DIAGNOSIS — C9002 Multiple myeloma in relapse: Secondary | ICD-10-CM | POA: Diagnosis present

## 2017-08-28 DIAGNOSIS — R7989 Other specified abnormal findings of blood chemistry: Secondary | ICD-10-CM | POA: Diagnosis not present

## 2017-08-28 DIAGNOSIS — Z5112 Encounter for antineoplastic immunotherapy: Secondary | ICD-10-CM | POA: Diagnosis present

## 2017-08-28 MED ORDER — MONTELUKAST SODIUM 10 MG PO TABS: 10.0000 mg | ORAL_TABLET | Freq: Every day | ORAL | 0 refills | Status: DC

## 2017-08-28 NOTE — Progress Notes (Signed)
Hematology and Oncology Follow Up Visit  JYLL TOMARO [id number] 01/21/1951 67 y.o. 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 Rooke is here today for a follow-up.  Unfortunately, it looks like she is already progressing.  Her Kappa light chain is now over 800 I am absolutely surprised by this.  Her renal function is going up.  Her creatinine was 2.4  mg/dL.  We saw her last week.  I think it is becoming apparent that her myeloma is progressing and becoming much more resilient.  When I looked at her blood smear, I could see plasma cells on the blood smear.  I worry that she might be trying to transform into plasma cell leukemia.  She is just not a good candidate for highly aggressive intervention.  I do not think that she would do well with VD-PACE chemotherapy.  She does not want to lose her hair.  Her weight is only 97 pounds.  Her family once again caught up with her as she was going up to [facility] where she and her husband live.  They brought her back today.  She actually says she feels okay.  She does not have any complaints.  She has no nausea or vomiting.  There is no cough.  There is no fatigue.  She has had no rashes.  There is no leg swelling.  I think that daratumumab therapy would be reasonable for her.  Hopefully we can get away with single agent daratumumab therapy with her.  We could always add Revlimid depending on her renal function.  She has a performance status of ECOG 2.     Medications:  Allergies as of 08/28/2017      Reactions   Milk-related Compounds  Anaphylaxis   Throat closes    No Known Allergies       Medication List        Accurate as of 08/28/17  4:21 PM. Always use your most recent med list.          aspirin EC 81 MG tablet Take 81 mg by mouth daily.   cefdinir 300 MG capsule Commonly known as:  OMNICEF Take 1 capsule (300 mg total) by mouth 2 (two) times daily.   dexamethasone 4 MG tablet Commonly known as:  DECADRON TAKE 3 TABLETS ONCE WEEKLY WITH FOOD   famciclovir 250 MG tablet Commonly known as:  FAMVIR Take 1 tablet (250 mg total) by mouth daily.   magnesium oxide 400 MG tablet Commonly known as:  MAG-OX Take 400 mg by mouth daily.   montelukast 10 MG tablet Commonly known as:  SINGULAIR Take 1 tablet (10 mg total) by mouth at bedtime.   multivitamin tablet Take 1 tablet by mouth daily.   Vitamin D 2000 units tablet Take 2,000 Units by mouth daily.       Allergies:  Allergies  Allergen Reactions  . Milk-Related Compounds Anaphylaxis    Throat closes   . No Known Allergies     Past Medical History, Surgical history, Social history, and Family History were reviewed and updated.  Review of Systems: Review of Systems  Constitutional: Negative.   HENT: Negative.   Eyes: Negative.   Respiratory: Negative.   Cardiovascular: Negative.   Gastrointestinal: Negative.   Genitourinary: Negative.   Musculoskeletal: Negative.   Skin: Negative.   Neurological: Negative.   Endo/Heme/Allergies: Negative.   Psychiatric/Behavioral: Negative.     Physical Exam:  weight is 97 lb (44 kg). Her oral temperature is 98.1 F (36.7 C). Her blood pressure is 148/63 (abnormal) and her pulse is 91. Her respiration is 16 and oxygen saturation is 100%.   Wt Readings from Last 3 Encounters:  08/28/17 97 lb (44 kg)  08/23/17 94 lb 5 oz (42.8 kg)  08/08/17 94 lb 4 oz (42.8 kg)    Physical Exam  Constitutional: She is oriented to person, place, and time.  HENT:  Head: Normocephalic and atraumatic.    Mouth/Throat: Oropharynx is clear and moist.  Eyes: Pupils are equal, round, and reactive to light. EOM are normal.  Neck: Normal range of motion.  Cardiovascular: Normal rate, regular rhythm and normal heart sounds.  Pulmonary/Chest: Effort normal and breath sounds normal.  Abdominal: Soft. Bowel sounds are normal.  Musculoskeletal: Normal range of motion. She exhibits no edema, tenderness or deformity.  Lymphadenopathy:    She has no cervical adenopathy.  Neurological: She is alert and oriented to person, place, and time.  Skin: Skin is warm and dry. No rash noted. No erythema.  Psychiatric: She has a normal mood and affect. Her behavior is normal. Judgment and thought content normal.  Vitals reviewed.    Lab Results  Component Value Date   WBC 9.2 08/23/2017   HGB 9.7 (L) 08/23/2017   HCT 29.3 (L) 08/23/2017   MCV 95.1 08/23/2017   PLT 107 (L) 08/23/2017   Lab Results  Component Value Date   FERRITIN 1,608 (H) 08/23/2017   IRON 98 08/23/2017   TIBC 210 (L) 08/23/2017   UIBC 112 08/23/2017   IRONPCTSAT 47 08/23/2017   Lab Results  Component Value Date   RBC 3.08 (L) 08/23/2017   Lab Results  Component Value Date   KPAFRELGTCHN 8,039.8 (H) 08/23/2017   LAMBDASER <1.5 (L) 08/23/2017   KAPLAMBRATIO See below. 08/23/2017   Lab Results  Component Value Date   IGGSERUM 182 (L) 08/23/2017   IGA <5 (L) 08/23/2017   IGMSERUM 10 (L) 08/23/2017   Lab Results  Component Value Date   TOTALPROTELP 5.7 (L) 08/02/2017   ALBUMINELP 3.2 08/02/2017   A1GS 0.4 08/02/2017   A2GS 1.0 08/02/2017   BETS 0.9 08/02/2017   GAMS 0.2 (L) 08/02/2017   MSPIKE 0.1 (H) 08/02/2017     Chemistry      Component Value Date/Time   NA 142 08/23/2017 1322   NA 148 (H) 03/17/2017 1210   K 4.7 08/23/2017 1322   K 4.4 03/17/2017 1210   CL 106 08/23/2017 1322   CL 106 03/17/2017 1210   CO2 23 08/23/2017 1322   CO2 27 03/17/2017 1210   BUN 38 (H) 08/23/2017 1322   BUN 23 (H) 03/17/2017  1210   CREATININE 2.40 (H) 08/23/2017 1322   CREATININE 1.1 03/17/2017 1210      Component Value Date/Time   CALCIUM 9.7 08/23/2017 1322   CALCIUM 9.1 03/17/2017 1210   ALKPHOS 61 08/23/2017 1322   ALKPHOS 52 03/17/2017 1210   AST 21 08/23/2017 1322   ALT 26 08/23/2017 1322   ALT 21 03/17/2017 1210   BILITOT 0.7 08/23/2017 1322      Impression and Plan: Ms. Kenedy  is a very pleasant 67 yo caucasian female with IgA kappa myeloma with 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  I spent about 40 minutes with she and her family.  We had to make a change quickly or else I fear that she will go into renal failure.  She does need to have a Port-A-Cath placed.  I do not think we can get this in this week.  I explained to her what a Port-A-Cath is.  She actually had a dialysis catheter when she was up in [place] last year.  I told her this was a lot easier than a dialysis catheter.  Hopefully, we will be able to see a response.  If not, then our only option would be aggressive chemotherapy and I just am not sure she would do well with this.  We will try to start on June 6.  She will get weekly treatments.  I spent all of the 40 minutes with she and her family.  I gave the information about the Daratumumab.  I went over the side effects.  We will call in some Singulair (10 mg p.o. daily) and hopefully this will help minimize an allergic reaction.   We will plan to have her come back to see [place] in a week or so.  Volanda Napoleon, MD 6/3/20194:21 PM

## 2017-08-29 LAB — PROTEIN ELECTROPHORESIS, SERUM, WITH REFLEX
A/G RATIO SPE: 1.4 (ref 0.7–1.7)
ALPHA-2-GLOBULIN: 1 g/dL (ref 0.4–1.0)
Albumin ELP: 3.3 g/dL (ref 2.9–4.4)
Alpha-1-Globulin: 0.3 g/dL (ref 0.0–0.4)
Beta Globulin: 0.8 g/dL (ref 0.7–1.3)
GLOBULIN, TOTAL: 2.3 g/dL (ref 2.2–3.9)
Gamma Globulin: 0.2 g/dL — ABNORMAL LOW (ref 0.4–1.8)
M-SPIKE, %: 0.1 g/dL — AB
SPEP INTERP: 0
TOTAL PROTEIN ELP: 5.6 g/dL — AB (ref 6.0–8.5)

## 2017-08-29 LAB — IMMUNOFIXATION REFLEX, SERUM: IgG (Immunoglobin G), Serum: 126 mg/dL — ABNORMAL LOW (ref 700–1600)

## 2017-08-30 ENCOUNTER — Ambulatory Visit: Payer: Medicare Other | Admitting: Hematology & Oncology

## 2017-08-30 ENCOUNTER — Other Ambulatory Visit: Payer: Medicare Other

## 2017-08-30 ENCOUNTER — Other Ambulatory Visit: Payer: Self-pay | Admitting: Hematology & Oncology

## 2017-08-30 ENCOUNTER — Ambulatory Visit: Payer: Medicare Other

## 2017-08-30 DIAGNOSIS — C9000 Multiple myeloma not having achieved remission: Secondary | ICD-10-CM

## 2017-08-31 ENCOUNTER — Inpatient Hospital Stay: Payer: Medicare Other

## 2017-08-31 VITALS — BP 109/58 | HR 77 | Temp 98.0°F | Resp 17

## 2017-08-31 DIAGNOSIS — Z5112 Encounter for antineoplastic immunotherapy: Secondary | ICD-10-CM | POA: Diagnosis not present

## 2017-08-31 DIAGNOSIS — C9000 Multiple myeloma not having achieved remission: Secondary | ICD-10-CM

## 2017-08-31 LAB — CMP (CANCER CENTER ONLY)
ALK PHOS: 47 U/L (ref 26–84)
ALT: 19 U/L (ref 10–47)
AST: 20 U/L (ref 11–38)
Albumin: 2.9 g/dL — ABNORMAL LOW (ref 3.5–5.0)
Anion gap: 15 (ref 5–15)
BILIRUBIN TOTAL: 0.5 mg/dL (ref 0.2–1.6)
BUN: 59 mg/dL — ABNORMAL HIGH (ref 7–22)
CALCIUM: 8.6 mg/dL (ref 8.0–10.3)
CO2: 21 mmol/L (ref 18–33)
CREATININE: 2.9 mg/dL — AB (ref 0.60–1.20)
Chloride: 109 mmol/L — ABNORMAL HIGH (ref 98–108)
Glucose, Bld: 107 mg/dL (ref 73–118)
Potassium: 4 mmol/L (ref 3.3–4.7)
Sodium: 145 mmol/L (ref 128–145)
Total Protein: 5.9 g/dL — ABNORMAL LOW (ref 6.4–8.1)

## 2017-08-31 LAB — CBC WITH DIFFERENTIAL (CANCER CENTER ONLY)
BASOS ABS: 0 10*3/uL (ref 0.0–0.1)
BASOS PCT: 0 %
EOS ABS: 0 10*3/uL (ref 0.0–0.5)
EOS PCT: 0 %
HCT: 25.7 % — ABNORMAL LOW (ref 34.8–46.6)
Hemoglobin: 8.5 g/dL — ABNORMAL LOW (ref 11.6–15.9)
LYMPHS ABS: 2 10*3/uL (ref 0.9–3.3)
Lymphocytes Relative: 35 %
MCH: 31.6 pg (ref 26.0–34.0)
MCHC: 33.1 g/dL (ref 32.0–36.0)
MCV: 95.5 fL (ref 81.0–101.0)
Monocytes Absolute: 1.2 10*3/uL — ABNORMAL HIGH (ref 0.1–0.9)
Monocytes Relative: 21 %
Neutro Abs: 2.4 10*3/uL (ref 1.5–6.5)
Neutrophils Relative %: 44 %
PLATELETS: 72 10*3/uL — AB (ref 145–400)
RBC: 2.69 MIL/uL — AB (ref 3.70–5.32)
RDW: 16.5 % — AB (ref 11.1–15.7)
WBC: 5.6 10*3/uL (ref 3.9–10.0)

## 2017-08-31 LAB — SAMPLE TO BLOOD BANK

## 2017-08-31 MED ORDER — METHYLPREDNISOLONE SODIUM SUCC 125 MG IJ SOLR
INTRAMUSCULAR | Status: AC
  Filled 2017-08-31: qty 2

## 2017-08-31 MED ORDER — ACETAMINOPHEN 325 MG PO TABS
ORAL_TABLET | ORAL | Status: AC
  Filled 2017-08-31: qty 2

## 2017-08-31 MED ORDER — PROCHLORPERAZINE MALEATE 10 MG PO TABS
ORAL_TABLET | ORAL | Status: AC
  Filled 2017-08-31: qty 1

## 2017-08-31 MED ORDER — ACETAMINOPHEN 325 MG PO TABS
650.0000 mg | ORAL_TABLET | Freq: Once | ORAL | Status: AC
  Administered 2017-08-31: 650 mg via ORAL

## 2017-08-31 MED ORDER — PROCHLORPERAZINE MALEATE 10 MG PO TABS
10.0000 mg | ORAL_TABLET | Freq: Once | ORAL | Status: AC
  Administered 2017-08-31: 10 mg via ORAL

## 2017-08-31 MED ORDER — SODIUM CHLORIDE 0.9 % IV SOLN
Freq: Once | INTRAVENOUS | Status: AC
  Administered 2017-08-31: 09:00:00 via INTRAVENOUS

## 2017-08-31 MED ORDER — METHYLPREDNISOLONE SODIUM SUCC 125 MG IJ SOLR
125.0000 mg | Freq: Once | INTRAMUSCULAR | Status: AC
  Administered 2017-08-31: 125 mg via INTRAVENOUS

## 2017-08-31 MED ORDER — SODIUM CHLORIDE 0.9 % IV SOLN
700.0000 mg | Freq: Once | INTRAVENOUS | Status: AC
  Administered 2017-08-31: 700 mg via INTRAVENOUS
  Filled 2017-08-31: qty 35

## 2017-08-31 MED ORDER — DIPHENHYDRAMINE HCL 25 MG PO CAPS
ORAL_CAPSULE | ORAL | Status: AC
  Filled 2017-08-31: qty 2

## 2017-08-31 MED ORDER — DIPHENHYDRAMINE HCL 25 MG PO CAPS
50.0000 mg | ORAL_CAPSULE | Freq: Once | ORAL | Status: AC
Start: 2017-08-31 — End: 2017-08-31
  Administered 2017-08-31: 50 mg via ORAL

## 2017-08-31 NOTE — Progress Notes (Signed)
Ok to treat with platelets of 72 & creatinine of 2.9 per Dr Marin Olp. dph

## 2017-08-31 NOTE — Patient Instructions (Signed)
Daratumumab injection What is this medicine? DARATUMUMAB (dar a toom ue mab) is a monoclonal antibody. It is used to treat multiple myeloma. This medicine may be used for other purposes; ask your health care provider or pharmacist if you have questions. COMMON BRAND NAME(S): DARZALEX What should I tell my health care provider before I take this medicine? They need to know if you have any of these conditions: -infection (especially a virus infection such as chickenpox, cold sores, or herpes) -lung or breathing disease -pregnant or trying to get pregnant -breast-feeding -an unusual or allergic reaction to daratumumab, other medicines, foods, dyes, or preservatives How should I use this medicine? This medicine is for infusion into a vein. It is given by a health care professional in a hospital or clinic setting. Talk to your pediatrician regarding the use of this medicine in children. Special care may be needed. Overdosage: If you think you have taken too much of this medicine contact a poison control center or emergency room at once. NOTE: This medicine is only for you. Do not share this medicine with others. What if I miss a dose? Keep appointments for follow-up doses as directed. It is important not to miss your dose. Call your doctor or health care professional if you are unable to keep an appointment. What may interact with this medicine? Interactions have not been studied. Give your health care provider a list of all the medicines, herbs, non-prescription drugs, or dietary supplements you use. Also tell them if you smoke, drink alcohol, or use illegal drugs. Some items may interact with your medicine. This list may not describe all possible interactions. Give your health care provider a list of all the medicines, herbs, non-prescription drugs, or dietary supplements you use. Also tell them if you smoke, drink alcohol, or use illegal drugs. Some items may interact with your  medicine. What should I watch for while using this medicine? This drug may make you feel generally unwell. Report any side effects. Continue your course of treatment even though you feel ill unless your doctor tells you to stop. This medicine can cause serious allergic reactions. To reduce your risk you may need to take medicine before treatment with this medicine. Take your medicine as directed. This medicine can affect the results of blood tests to match your blood type. These changes can last for up to 6 months after the final dose. Your healthcare provider will do blood tests to match your blood type before you start treatment. Tell all of your healthcare providers that you are being treated with this medicine before receiving a blood transfusion. This medicine can affect the results of some tests used to determine treatment response; extra tests may be needed to evaluate response. Do not become pregnant while taking this medicine or for 3 months after stopping it. Women should inform their doctor if they wish to become pregnant or think they might be pregnant. There is a potential for serious side effects to an unborn child. Talk to your health care professional or pharmacist for more information. What side effects may I notice from receiving this medicine? Side effects that you should report to your doctor or health care professional as soon as possible: -allergic reactions like skin rash, itching or hives, swelling of the face, lips, or tongue -breathing problems -chills -cough -dizziness -feeling faint or lightheaded -headache -low blood counts - this medicine may decrease the number of white blood cells, red blood cells and platelets. You may be at increased  risk for infections and bleeding. -nausea, vomiting -shortness of breath -signs of decreased platelets or bleeding - bruising, pinpoint red spots on the skin, black, tarry stools, blood in the urine -signs of decreased red blood  cells - unusually weak or tired, feeling faint or lightheaded, falls -signs of infection - fever or chills, cough, sore throat, pain or difficulty passing urine Side effects that usually do not require medical attention (report to your doctor or health care professional if they continue or are bothersome): -back pain -diarrhea -muscle cramps -pain, tingling, numbness in the hands or feet -swelling of the ankles, feet, hands -tiredness This list may not describe all possible side effects. Call your doctor for medical advice about side effects. You may report side effects to FDA at 1-800-FDA-1088. Where should I keep my medicine? Keep out of the reach of children. This drug is given in a hospital or clinic and will not be stored at home. NOTE: This sheet is a summary. It may not cover all possible information. If you have questions about this medicine, talk to your doctor, pharmacist, or health care provider.  2018 Elsevier/Gold Standard (2015-04-16 10:38:11)  TAKE DECADRON 4MG FOR TWO DAYS BEGINNING THE NIGHT OF CHEMOTHERAPY.   

## 2017-08-31 NOTE — Progress Notes (Signed)
Patient complains of IV coming out after she used the restroom. Blood noticed in bathroom. Patient has bruised area at the IV site. Pressure dressing applied to previous IV site. New IV started. Darzalex restarted at 200 mL/hr.

## 2017-09-01 ENCOUNTER — Telehealth: Payer: Self-pay | Admitting: *Deleted

## 2017-09-01 ENCOUNTER — Other Ambulatory Visit: Payer: Self-pay | Admitting: *Deleted

## 2017-09-01 MED ORDER — LIDOCAINE-PRILOCAINE 2.5-2.5 % EX CREA: 1.0000 "application " | TOPICAL_CREAM | CUTANEOUS | 6 refills | Status: AC | PRN

## 2017-09-01 NOTE — Telephone Encounter (Signed)
Patient is doing well today with no problems or side effects. She has her medications at home and is taking them appropriately. She also has prn medication if needed. Prescription called in for EMLA cream in anticipation of a new PORT being placed next week.  Patient and husband know to call the office if she has any problems, or questions arise.

## 2017-09-03 ENCOUNTER — Other Ambulatory Visit: Payer: Self-pay | Admitting: Radiology

## 2017-09-04 ENCOUNTER — Other Ambulatory Visit: Payer: Self-pay | Admitting: Student

## 2017-09-05 ENCOUNTER — Encounter (HOSPITAL_COMMUNITY): Payer: Self-pay

## 2017-09-05 ENCOUNTER — Ambulatory Visit (HOSPITAL_COMMUNITY)
Admission: RE | Admit: 2017-09-05 | Discharge: 2017-09-05 | Disposition: A | Discharge status: Home / Self Care | Payer: Medicare Other | Source: Ambulatory Visit | Attending: Hematology & Oncology | Admitting: Hematology & Oncology

## 2017-09-05 DIAGNOSIS — C9000 Multiple myeloma not having achieved remission: Secondary | ICD-10-CM

## 2017-09-06 ENCOUNTER — Ambulatory Visit: Payer: Medicare Other | Admitting: Hematology & Oncology

## 2017-09-06 ENCOUNTER — Other Ambulatory Visit: Payer: Medicare Other

## 2017-09-06 ENCOUNTER — Ambulatory Visit: Payer: Medicare Other

## 2017-09-07 ENCOUNTER — Inpatient Hospital Stay: Payer: Medicare Other

## 2017-09-07 ENCOUNTER — Other Ambulatory Visit: Payer: Self-pay | Admitting: *Deleted

## 2017-09-07 ENCOUNTER — Other Ambulatory Visit: Payer: Medicare Other

## 2017-09-07 ENCOUNTER — Inpatient Hospital Stay (HOSPITAL_BASED_OUTPATIENT_CLINIC_OR_DEPARTMENT_OTHER): Payer: Medicare Other | Admitting: Family

## 2017-09-07 ENCOUNTER — Encounter: Payer: Self-pay | Admitting: Family

## 2017-09-07 ENCOUNTER — Other Ambulatory Visit: Payer: Self-pay

## 2017-09-07 VITALS — BP 112/59 | HR 86 | Temp 98.2°F | Resp 18 | Wt 95.0 lb

## 2017-09-07 VITALS — BP 99/47 | HR 80 | Temp 98.1°F | Resp 16

## 2017-09-07 DIAGNOSIS — D8989 Other specified disorders involving the immune mechanism, not elsewhere classified: Secondary | ICD-10-CM

## 2017-09-07 DIAGNOSIS — C9000 Multiple myeloma not having achieved remission: Secondary | ICD-10-CM

## 2017-09-07 DIAGNOSIS — Z5112 Encounter for antineoplastic immunotherapy: Secondary | ICD-10-CM | POA: Diagnosis not present

## 2017-09-07 DIAGNOSIS — N179 Acute kidney failure, unspecified: Secondary | ICD-10-CM

## 2017-09-07 DIAGNOSIS — C9002 Multiple myeloma in relapse: Secondary | ICD-10-CM | POA: Diagnosis not present

## 2017-09-07 LAB — CMP (CANCER CENTER ONLY)
ALBUMIN: 3.1 g/dL — AB (ref 3.5–5.0)
ALK PHOS: 53 U/L (ref 26–84)
ALT: 27 U/L (ref 10–47)
AST: 23 U/L (ref 11–38)
Anion gap: 11 (ref 5–15)
BILIRUBIN TOTAL: 0.7 mg/dL (ref 0.2–1.6)
BUN: 39 mg/dL — AB (ref 7–22)
CALCIUM: 9.3 mg/dL (ref 8.0–10.3)
CO2: 25 mmol/L (ref 18–33)
CREATININE: 2.1 mg/dL — AB (ref 0.60–1.20)
Chloride: 104 mmol/L (ref 98–108)
Glucose, Bld: 96 mg/dL (ref 73–118)
Potassium: 4.3 mmol/L (ref 3.3–4.7)
SODIUM: 140 mmol/L (ref 128–145)
TOTAL PROTEIN: 6 g/dL — AB (ref 6.4–8.1)

## 2017-09-07 LAB — CBC WITH DIFFERENTIAL (CANCER CENTER ONLY)
BASOS ABS: 0 10*3/uL (ref 0.0–0.1)
BASOS PCT: 1 %
EOS ABS: 0 10*3/uL (ref 0.0–0.5)
Eosinophils Relative: 1 %
HCT: 27.4 % — ABNORMAL LOW (ref 34.8–46.6)
HEMOGLOBIN: 8.8 g/dL — AB (ref 11.6–15.9)
Lymphocytes Relative: 19 %
Lymphs Abs: 0.4 10*3/uL — ABNORMAL LOW (ref 0.9–3.3)
MCH: 31.3 pg (ref 26.0–34.0)
MCHC: 32.1 g/dL (ref 32.0–36.0)
MCV: 97.5 fL (ref 81.0–101.0)
MONOS PCT: 8 %
Monocytes Absolute: 0.2 10*3/uL (ref 0.1–0.9)
NEUTROS PCT: 71 %
Neutro Abs: 1.4 10*3/uL — ABNORMAL LOW (ref 1.5–6.5)
Platelet Count: 38 10*3/uL — ABNORMAL LOW (ref 145–400)
RBC: 2.81 MIL/uL — AB (ref 3.70–5.32)
RDW: 19 % — ABNORMAL HIGH (ref 11.1–15.7)
WBC: 2 10*3/uL — AB (ref 3.9–10.0)

## 2017-09-07 LAB — SAMPLE TO BLOOD BANK

## 2017-09-07 MED ORDER — ACETAMINOPHEN 325 MG PO TABS
ORAL_TABLET | ORAL | Status: AC
  Filled 2017-09-07: qty 2

## 2017-09-07 MED ORDER — ACETAMINOPHEN 325 MG PO TABS
650.0000 mg | ORAL_TABLET | Freq: Once | ORAL | Status: AC
  Administered 2017-09-07: 650 mg via ORAL

## 2017-09-07 MED ORDER — SODIUM CHLORIDE 0.9 % IV SOLN
700.0000 mg | Freq: Once | INTRAVENOUS | Status: AC
  Administered 2017-09-07: 700 mg via INTRAVENOUS
  Filled 2017-09-07: qty 35

## 2017-09-07 MED ORDER — METHYLPREDNISOLONE SODIUM SUCC 125 MG IJ SOLR
125.0000 mg | Freq: Once | INTRAMUSCULAR | Status: AC
  Administered 2017-09-07: 125 mg via INTRAVENOUS

## 2017-09-07 MED ORDER — PROCHLORPERAZINE MALEATE 10 MG PO TABS
10.0000 mg | ORAL_TABLET | Freq: Once | ORAL | Status: AC
  Administered 2017-09-07: 10 mg via ORAL

## 2017-09-07 MED ORDER — METHYLPREDNISOLONE SODIUM SUCC 125 MG IJ SOLR
INTRAMUSCULAR | Status: AC
  Filled 2017-09-07: qty 2

## 2017-09-07 MED ORDER — FAMCICLOVIR 250 MG PO TABS: 250.0000 mg | ORAL_TABLET | Freq: Every day | ORAL | 8 refills | Status: DC

## 2017-09-07 MED ORDER — PROCHLORPERAZINE MALEATE 10 MG PO TABS
ORAL_TABLET | ORAL | Status: AC
  Filled 2017-09-07: qty 1

## 2017-09-07 MED ORDER — DIPHENHYDRAMINE HCL 25 MG PO CAPS
50.0000 mg | ORAL_CAPSULE | Freq: Once | ORAL | Status: AC
  Administered 2017-09-07: 50 mg via ORAL

## 2017-09-07 MED ORDER — SODIUM CHLORIDE 0.9 % IV SOLN
Freq: Once | INTRAVENOUS | Status: AC
  Administered 2017-09-07: 10:00:00 via INTRAVENOUS

## 2017-09-07 MED ORDER — DIPHENHYDRAMINE HCL 25 MG PO CAPS
ORAL_CAPSULE | ORAL | Status: AC
  Filled 2017-09-07: qty 2

## 2017-09-07 NOTE — Progress Notes (Signed)
Hematology and Oncology Follow Up Visit  Jeanne Martinez [id number] 1950/10/07 67 y.o. 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  She states that she feels great and has a great deal of energy. She is staying active walking her dog and cleaning her house.  She has a good appetite and is staying well hydrated. Her weight is down 2 lbs since her last visit.  She tolerated her first cycle of Darzalex well. Her counts did drop a little. WBC count is 2, Hgb 8.8 and platelet count is now 30.  Creatinine is a little better at 2.10, BUN 39. Kappa light chain 2 weeks ago were 803.9 mg/dL. She is bruising easily and has a broken blood vessel in the sclera of her left eye. She has had no episodes of bleeding, no petechiae noted on exam.  No fever, chills, n/v, cough, rash, dizziness, SOB, chest pain, palpitations, abdominal pain or changes in bowel or bladder habits.  She has mild puffiness in her feet that comes and goes. No tenderness, numbness or tingling in her extremities.   ECOG Performance Status: 1 - Symptomatic but completely ambulatory  Medications:  Allergies as of 09/07/2017      Reactions   Milk-related Compounds Anaphylaxis   Throat closes    No Known Allergies       Medication List        Accurate as of 09/07/17  8:59 AM. Always use your most recent med list.          aspirin EC 81 MG tablet Take 81 mg by mouth daily.   dexamethasone 4 MG tablet Commonly  known as:  DECADRON TAKE 3 TABLETS ONCE WEEKLY WITH FOOD   famciclovir 250 MG tablet Commonly known as:  FAMVIR Take 1 tablet (250 mg total) by mouth daily.   lidocaine-prilocaine cream Commonly known as:  EMLA Apply 1 application topically as needed. Apply to port one hour before appointment   LORazepam 0.5 MG tablet Commonly known as:  ATIVAN Take 0.5 mg by mouth every 6 (six) hours as needed (NAUSEA AND VOMITING).   magnesium oxide 400 MG tablet Commonly known as:  MAG-OX Take 400 mg by mouth daily.   montelukast 10 MG tablet Commonly known as:  SINGULAIR Take 1 tablet (10 mg total) by mouth at bedtime.   multivitamin tablet Take 1 tablet by mouth daily.   prochlorperazine 10 MG tablet Commonly known as:  COMPAZINE Take 10 mg by mouth every 6 (six) hours as needed for nausea or vomiting.   Vitamin D 2000 units tablet Take 2,000 Units by mouth daily.       Allergies:  Allergies  Allergen Reactions  . Milk-Related Compounds Anaphylaxis    Throat closes   . No Known Allergies     Past Medical History, Surgical history, Social history, and Family History were reviewed and updated.  Review of Systems: All other 10 point review of systems is negative.   Physical Exam:  vitals were not taken for this visit.  Wt Readings from Last 3 Encounters:  08/28/17 97 lb (44 kg)  08/23/17 94 lb 5 oz (42.8 kg)  08/08/17 94 lb 4 oz (42.8 kg)    Ocular: Sclerae unicteric, pupils equal, round and reactive to light Ear-nose-throat: Oropharynx clear, dentition fair Lymphatic: No cervical, supraclavicular or axillary adenopathy Lungs no rales or rhonchi, good excursion bilaterally Heart regular rate and rhythm, no murmur appreciated Abd soft, nontender, positive bowel sounds, no liver or spleen tip palpated on exam, no fluid wave  MSK no focal spinal tenderness, no joint edema Neuro: non-focal, well-oriented, appropriate affect Breasts: Deferred   Lab Results  Component  Value Date   WBC 2.0 (L) 09/07/2017   HGB 8.8 (L) 09/07/2017   HCT 27.4 (L) 09/07/2017   MCV 97.5 09/07/2017   PLT 38 (L) 09/07/2017   Lab Results  Component Value Date   FERRITIN 1,608 (H) 08/23/2017   IRON 98 08/23/2017   TIBC 210 (L) 08/23/2017   UIBC 112 08/23/2017   IRONPCTSAT 47 08/23/2017   Lab Results  Component Value Date   RBC 2.81 (L) 09/07/2017   Lab Results  Component Value Date   KPAFRELGTCHN 8,039.8 (H) 08/23/2017   LAMBDASER <1.5 (L) 08/23/2017   KAPLAMBRATIO See below. 08/23/2017   Lab Results  Component Value Date   IGGSERUM 182 (L) 08/23/2017   IGGSERUM 126 (L) 08/23/2017   IGA <5 (L) 08/23/2017   IGA <5 (L) 08/23/2017   IGMSERUM 10 (L) 08/23/2017   IGMSERUM <5 (L) 08/23/2017   Lab Results  Component Value Date   TOTALPROTELP 5.6 (L) 08/23/2017   ALBUMINELP 3.3 08/23/2017   A1GS 0.3 08/23/2017   A2GS 1.0 08/23/2017   BETS 0.8 08/23/2017   GAMS 0.2 (L) 08/23/2017   MSPIKE 0.1 (H) 08/23/2017     Chemistry      Component Value Date/Time   NA 145 08/31/2017 0810   NA 148 (H) 03/17/2017 1210   K 4.0 08/31/2017 0810   K 4.4 03/17/2017 1210   CL 109 (H) 08/31/2017 0810   CL 106 03/17/2017 1210   CO2 21 08/31/2017 0810   CO2 27 03/17/2017 1210   BUN 59 (H) 08/31/2017 0810   BUN 23 (H) 03/17/2017 1210   CREATININE 2.90 (H) 08/31/2017 0810   CREATININE 1.1 03/17/2017 1210      Component Value Date/Time   CALCIUM 8.6 08/31/2017 0810   CALCIUM 9.1 03/17/2017 1210   ALKPHOS 47 08/31/2017 0810   ALKPHOS 52 03/17/2017 1210   AST 20 08/31/2017 0810   ALT 19 08/31/2017 0810   ALT 21 03/17/2017 1210   BILITOT 0.5 08/31/2017 0810      Impression and Plan: Jeanne Martinez is a very pleasant 67 yo caucasian female with IgA kappa myeloma with 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 She tolerated her first cycle of Darzalex well and has no complaints at this time.  Her WBC count and platelets did drop. I discussed this with Dr. Marin Olp  and we will proceed with treatment today as planned.  She is aware that her platelet count is down at 38 and will watch for s/s of bleeding.  She rescheduled her port a cath placement and is having it put in next week on 6/19.  We will plan to see her back again next week for follow-up and treatment.  They will contact our office with any questions or concerns. We can certainly see her sooner if need be.   Laverna Peace, NP 6/13/20198:59 AM

## 2017-09-07 NOTE — Patient Instructions (Signed)
Daratumumab injection What is this medicine? DARATUMUMAB (dar a toom ue mab) is a monoclonal antibody. It is used to treat multiple myeloma. This medicine may be used for other purposes; ask your health care provider or pharmacist if you have questions. COMMON BRAND NAME(S): DARZALEX What should I tell my health care provider before I take this medicine? They need to know if you have any of these conditions: -infection (especially a virus infection such as chickenpox, cold sores, or herpes) -lung or breathing disease -pregnant or trying to get pregnant -breast-feeding -an unusual or allergic reaction to daratumumab, other medicines, foods, dyes, or preservatives How should I use this medicine? This medicine is for infusion into a vein. It is given by a health care professional in a hospital or clinic setting. Talk to your pediatrician regarding the use of this medicine in children. Special care may be needed. Overdosage: If you think you have taken too much of this medicine contact a poison control center or emergency room at once. NOTE: This medicine is only for you. Do not share this medicine with others. What if I miss a dose? Keep appointments for follow-up doses as directed. It is important not to miss your dose. Call your doctor or health care professional if you are unable to keep an appointment. What may interact with this medicine? Interactions have not been studied. Give your health care provider a list of all the medicines, herbs, non-prescription drugs, or dietary supplements you use. Also tell them if you smoke, drink alcohol, or use illegal drugs. Some items may interact with your medicine. This list may not describe all possible interactions. Give your health care provider a list of all the medicines, herbs, non-prescription drugs, or dietary supplements you use. Also tell them if you smoke, drink alcohol, or use illegal drugs. Some items may interact with your medicine. What  should I watch for while using this medicine? This drug may make you feel generally unwell. Report any side effects. Continue your course of treatment even though you feel ill unless your doctor tells you to stop. This medicine can cause serious allergic reactions. To reduce your risk you may need to take medicine before treatment with this medicine. Take your medicine as directed. This medicine can affect the results of blood tests to match your blood type. These changes can last for up to 6 months after the final dose. Your healthcare provider will do blood tests to match your blood type before you start treatment. Tell all of your healthcare providers that you are being treated with this medicine before receiving a blood transfusion. This medicine can affect the results of some tests used to determine treatment response; extra tests may be needed to evaluate response. Do not become pregnant while taking this medicine or for 3 months after stopping it. Women should inform their doctor if they wish to become pregnant or think they might be pregnant. There is a potential for serious side effects to an unborn child. Talk to your health care professional or pharmacist for more information. What side effects may I notice from receiving this medicine? Side effects that you should report to your doctor or health care professional as soon as possible: -allergic reactions like skin rash, itching or hives, swelling of the face, lips, or tongue -breathing problems -chills -cough -dizziness -feeling faint or lightheaded -headache -low blood counts - this medicine may decrease the number of white blood cells, red blood cells and platelets. You may be at increased risk  for infections and bleeding. -nausea, vomiting -shortness of breath -signs of decreased platelets or bleeding - bruising, pinpoint red spots on the skin, black, tarry stools, blood in the urine -signs of decreased red blood cells - unusually  weak or tired, feeling faint or lightheaded, falls -signs of infection - fever or chills, cough, sore throat, pain or difficulty passing urine Side effects that usually do not require medical attention (report to your doctor or health care professional if they continue or are bothersome): -back pain -diarrhea -muscle cramps -pain, tingling, numbness in the hands or feet -swelling of the ankles, feet, hands -tiredness This list may not describe all possible side effects. Call your doctor for medical advice about side effects. You may report side effects to FDA at 1-800-FDA-1088. Where should I keep my medicine? Keep out of the reach of children. This drug is given in a hospital or clinic and will not be stored at home. NOTE: This sheet is a summary. It may not cover all possible information. If you have questions about this medicine, talk to your doctor, pharmacist, or health care provider.  2018 Elsevier/Gold Standard (2015-04-16 10:38:11)  

## 2017-09-07 NOTE — Progress Notes (Signed)
OK to treat with abnormal labs per Dr Ennever. dph 

## 2017-09-08 LAB — KAPPA/LAMBDA LIGHT CHAINS
Kappa free light chain: 7992.7 mg/L — ABNORMAL HIGH (ref 3.3–19.4)
Lambda free light chains: <1.5 mg/L — ABNORMAL LOW (ref 5.7–26.3)

## 2017-09-08 LAB — IGG, IGA, IGM
IgA: <5 mg/dL — ABNORMAL LOW (ref 87–352)
IgG (Immunoglobin G), Serum: 120 mg/dL — ABNORMAL LOW (ref 700–1600)

## 2017-09-12 ENCOUNTER — Ambulatory Visit (HOSPITAL_COMMUNITY): Payer: Medicare Other

## 2017-09-12 ENCOUNTER — Other Ambulatory Visit (HOSPITAL_COMMUNITY): Payer: Medicare Other

## 2017-09-12 ENCOUNTER — Other Ambulatory Visit: Payer: Self-pay | Admitting: Student

## 2017-09-13 ENCOUNTER — Ambulatory Visit: Payer: Medicare Other

## 2017-09-13 ENCOUNTER — Other Ambulatory Visit: Payer: Medicare Other

## 2017-09-13 ENCOUNTER — Other Ambulatory Visit: Payer: Self-pay | Admitting: Hematology & Oncology

## 2017-09-13 ENCOUNTER — Ambulatory Visit (HOSPITAL_COMMUNITY)
Admission: RE | Admit: 2017-09-13 | Discharge: 2017-09-13 | Disposition: A | Discharge status: Home / Self Care | Payer: Medicare Other | Source: Ambulatory Visit | Attending: Hematology & Oncology | Admitting: Hematology & Oncology

## 2017-09-13 ENCOUNTER — Encounter (HOSPITAL_COMMUNITY): Payer: Self-pay

## 2017-09-13 DIAGNOSIS — Z539 Procedure and treatment not carried out, unspecified reason: Secondary | ICD-10-CM | POA: Diagnosis not present

## 2017-09-13 DIAGNOSIS — C9000 Multiple myeloma not having achieved remission: Secondary | ICD-10-CM

## 2017-09-13 DIAGNOSIS — Z7982 Long term (current) use of aspirin: Secondary | ICD-10-CM | POA: Diagnosis not present

## 2017-09-13 DIAGNOSIS — D631 Anemia in chronic kidney disease: Secondary | ICD-10-CM | POA: Diagnosis not present

## 2017-09-13 DIAGNOSIS — N183 Chronic kidney disease, stage 3 (moderate): Secondary | ICD-10-CM | POA: Diagnosis not present

## 2017-09-13 DIAGNOSIS — I129 Hypertensive chronic kidney disease with stage 1 through stage 4 chronic kidney disease, or unspecified chronic kidney disease: Secondary | ICD-10-CM | POA: Insufficient documentation

## 2017-09-13 LAB — BASIC METABOLIC PANEL
Anion gap: 9 (ref 5–15)
BUN: 53 mg/dL — AB (ref 6–20)
CHLORIDE: 110 mmol/L (ref 101–111)
CO2: 21 mmol/L — AB (ref 22–32)
Calcium: 8.5 mg/dL — ABNORMAL LOW (ref 8.9–10.3)
Creatinine, Ser: 2.57 mg/dL — ABNORMAL HIGH (ref 0.44–1.00)
GFR calc Af Amer: 21 mL/min — ABNORMAL LOW (ref >60)
GFR calc non Af Amer: 18 mL/min — ABNORMAL LOW (ref >60)
GLUCOSE: 97 mg/dL (ref 65–99)
POTASSIUM: 3.4 mmol/L — AB (ref 3.5–5.1)
Sodium: 140 mmol/L (ref 135–145)

## 2017-09-13 LAB — PROTIME-INR
INR: 1.05
Prothrombin Time: 13.6 seconds (ref 11.4–15.2)

## 2017-09-13 LAB — CBC
HEMATOCRIT: 23.9 % — AB (ref 36.0–46.0)
Hemoglobin: 7.8 g/dL — ABNORMAL LOW (ref 12.0–15.0)
MCH: 31.2 pg (ref 26.0–34.0)
MCHC: 32.6 g/dL (ref 30.0–36.0)
MCV: 95.6 fL (ref 78.0–100.0)
Platelets: 23 10*3/uL — CL (ref 150–400)
RBC: 2.5 MIL/uL — ABNORMAL LOW (ref 3.87–5.11)
RDW: 19.5 % — AB (ref 11.5–15.5)
WBC: 1.3 10*3/uL — CL (ref 4.0–10.5)

## 2017-09-13 MED ORDER — CEFAZOLIN SODIUM-DEXTROSE 2-4 GM/100ML-% IV SOLN: 2.0000 g | INTRAVENOUS | Status: DC

## 2017-09-13 MED ORDER — SODIUM CHLORIDE 0.9 % IV SOLN: INTRAVENOUS | Status: DC

## 2017-09-13 NOTE — H&P (Signed)
Chief Complaint: Patient was seen in consultation today for multiple myeloma  Referring Physician(s): Ennever,Peter R  Supervising Physician: Markus Daft  Patient Status: [facility] - Out-pt  History of Present Illness: Jeanne Martinez is a 67 y.o. female with past medical history of HTN, CKD stage 3, and IgA kappa myeloma currently undergoing treatment.  She presents to radiology department today for Port-A-Cath placement.   She presents in her usual state of health today.  She denies new complaint.  She has been NPO.  She does not take blood thinners.   Past Medical History:  Diagnosis Date  . Anemia of chronic renal failure, stage 3 (moderate) ([place]) 08/08/2017  . Cancer ([place])    multiple myeloma  . Counseling regarding goals of care 08/08/2017  . Hypertension    patient denies  . 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 Renal disorder    acute renal failure due to myeloma    Past Surgical History:  Procedure Laterality Date  . NO PAST SURGERIES      Allergies: Milk-related compounds  Medications: Prior to Admission medications   Medication Sig Start Date End Date Taking? Authorizing Provider  aspirin EC 81 MG tablet Take 81 mg by mouth daily.   Yes [provider]  Cholecalciferol (VITAMIN D) 2000 units tablet Take 2,000 Units by mouth daily.   Yes [provider]  famciclovir (FAMVIR) 250 MG tablet Take 1 tablet (250 mg total) by mouth daily. 09/07/17  Yes Volanda Napoleon, MD  magnesium oxide (MAG-OX) 400 MG tablet Take 400 mg by mouth daily.   Yes [provider]  montelukast (SINGULAIR) 10 MG tablet Take 1 tablet (10 mg total) by mouth at bedtime. 08/28/17  Yes Volanda Napoleon, MD  Multiple Vitamin (MULTIVITAMIN) tablet Take 1 tablet by mouth daily.   Yes [provider]  dexamethasone (DECADRON) 4 MG tablet TAKE 3 TABLETS ONCE WEEKLY WITH FOOD Patient taking differently: Take 4 mg by mouth See admin  instructions. TAKES 1 TABLET DAILY FOR 2 DAYS AFTER CHEMO 07/26/17   Volanda Napoleon, MD  lidocaine-prilocaine (EMLA) cream Apply 1 application topically as needed. Apply to port one hour before appointment 09/01/17   Volanda Napoleon, MD  LORazepam (ATIVAN) 0.5 MG tablet Take 0.5 mg by mouth every 6 (six) hours as needed (NAUSEA AND VOMITING).    [provider]  prochlorperazine (COMPAZINE) 10 MG tablet Take 10 mg by mouth every 6 (six) hours as needed for nausea or vomiting.    [provider]     Family History  Problem Relation Age of Onset  . Multiple myeloma Mother   . Cancer Father     Social History   Socioeconomic History  . Marital status: Married    Spouse name: Not on file  . Number of children: Not on file  . Years of education: Not on file  . Highest education level: Not on file  Occupational History  . Occupation: retired    Comment: Designer, television/film set  Social Needs  . Financial resource strain: Not on file  . Food insecurity:    Worry: Not on file    Inability: Not on file  . Transportation needs:    Medical: Not on file    Non-medical: Not on file  Tobacco Use  . Smoking status: Never Smoker  . Smokeless tobacco: Never Used  Substance and Sexual Activity  . Alcohol use: No  . Drug use: No  .  Sexual activity: Not on file  Lifestyle  . Physical activity:    Days per week: Not on file    Minutes per session: Not on file  . Stress: Not on file  Relationships  . Social connections:    Talks on phone: Not on file    Gets together: Not on file    Attends religious service: Not on file    Active member of club or organization: Not on file    Attends meetings of clubs or organizations: Not on file    Relationship status: Not on file  Other Topics Concern  . Not on file  Social History Narrative  . Not on file     Review of Systems: A 12 point ROS discussed and pertinent positives are indicated in the HPI above.  All other systems  are negative.  Review of Systems  Constitutional: Negative for fatigue and fever.  Respiratory: Negative for cough and shortness of breath.   Cardiovascular: Negative for chest pain.  Gastrointestinal: Negative for abdominal pain.  Musculoskeletal: Negative for back pain.  Psychiatric/Behavioral: Negative for behavioral problems and confusion.    Vital Signs: BP (!) 118/54 (BP Location: Right Arm)   Pulse 90   Temp 98 F (36.7 C) (Oral)   Ht _0  (1.575 m)   Wt 95 lb (43.1 kg)   SpO2 100%   BMI 17.38 kg/m   Physical Exam  Constitutional: She is oriented to person, place, and time. She appears well-developed.  Neck: Normal range of motion. Neck supple.  Cardiovascular: Normal rate, regular rhythm and normal heart sounds.  Pulmonary/Chest: Effort normal and breath sounds normal. No respiratory distress.  Abdominal: Soft.  Neurological: She is alert and oriented to person, place, and time.  Skin: Skin is warm and dry.  Psychiatric: She has a normal mood and affect. Her behavior is normal. Judgment and thought content normal.  Nursing note and vitals reviewed.    MD Evaluation Airway: WNL Heart: WNL Abdomen: WNL Chest/ Lungs: WNL ASA  Classification: 3 Mallampati/Airway Score: One   Imaging: No results found.  Labs:  CBC: Recent Labs    08/23/17 1322 08/31/17 0810 09/07/17 0830 09/13/17 1044  WBC 9.2 5.6 2.0* 1.3*  HGB 9.7* 8.5* 8.8* 7.8*  HCT 29.3* 25.7* 27.4* 23.9*  PLT 107* 72* 38* PENDING    COAGS: Recent Labs    01/23/17 0909 09/13/17 1044  INR 0.91 1.05    BMP: Recent Labs    08/23/17 1322 08/31/17 0810 09/07/17 0830 09/13/17 1044  NA 142 145 140 140  K 4.7 4.0 4.3 3.4*  CL 106 109* 104 110  CO2 _1 21*  GLUCOSE 157* 107 96 97  BUN 38* 59* 39* 53*  CALCIUM 9.7 8.6 9.3 8.5*  CREATININE 2.40* 2.90* 2.10* 2.57*  GFRNONAA  --   --   --  18*  GFRAA  --   --   --  21*    LIVER FUNCTION TESTS: Recent Labs    08/16/17 1306  08/23/17 1322 08/31/17 0810 09/07/17 0830  BILITOT 0.8 0.7 0.5 0.7  AST _2 ALT _3 ALKPHOS 62 61 47 53  PROT 6.8 6.2* 5.9* 6.0*  ALBUMIN 3.5 3.2* 2.9* 3.1*    TUMOR MARKERS: No results for input(s): AFPTM, CEA, CA199, CHROMGRNA in the last 8760 hours.  Assessment and Plan: Patient with past medical history of CKD stage 3 presents with complaint of IgA kappa myeloma in  need of durable venous access for treatment.  IR consulted for Port-A-Cath placement at the request of Dr. Marin Olp. Patient presents today in their usual state of health.  She has been NPO and is not currently on blood thinners.  Of note, her platelets were 38K at the cancer center last week. Will await lab work today and discuss results with Dr. Anselm Pancoast prior to moving forward with case. Currently results are pending.  Risks and benefits of image guided port-a-catheter placement was discussed with the patient including, but not limited to bleeding, infection, pneumothorax, or fibrin sheath development and need for additional procedures.  All of the patient's questions were answered, patient is agreeable to proceed. Consent signed and in chart.  Thank you for this interesting consult.  I greatly enjoyed meeting RAEJEAN SWINFORD and look forward to participating in their care.  A copy of this report was sent to the requesting provider on this date.  Electronically Signed: Docia Barrier, PA 09/13/2017, 11:52 AM   I spent a total of  30 Minutes   in face to face in clinical consultation, greater than 50% of which was counseling/coordinating care for IgA kappa myeloma.

## 2017-09-14 ENCOUNTER — Inpatient Hospital Stay: Payer: Medicare Other

## 2017-09-14 ENCOUNTER — Other Ambulatory Visit: Payer: Self-pay

## 2017-09-14 ENCOUNTER — Encounter: Payer: Self-pay | Admitting: Hematology & Oncology

## 2017-09-14 ENCOUNTER — Ambulatory Visit: Payer: Medicare Other

## 2017-09-14 ENCOUNTER — Other Ambulatory Visit: Payer: Self-pay | Admitting: *Deleted

## 2017-09-14 ENCOUNTER — Inpatient Hospital Stay (HOSPITAL_BASED_OUTPATIENT_CLINIC_OR_DEPARTMENT_OTHER): Payer: Medicare Other | Admitting: Hematology & Oncology

## 2017-09-14 VITALS — BP 103/54 | HR 95 | Temp 98.7°F | Resp 18 | Wt 96.4 lb

## 2017-09-14 VITALS — BP 93/59 | HR 80 | Temp 97.7°F | Resp 18

## 2017-09-14 DIAGNOSIS — C9000 Multiple myeloma not having achieved remission: Secondary | ICD-10-CM

## 2017-09-14 DIAGNOSIS — R7989 Other specified abnormal findings of blood chemistry: Secondary | ICD-10-CM

## 2017-09-14 DIAGNOSIS — Z5112 Encounter for antineoplastic immunotherapy: Secondary | ICD-10-CM | POA: Diagnosis not present

## 2017-09-14 DIAGNOSIS — C9002 Multiple myeloma in relapse: Secondary | ICD-10-CM

## 2017-09-14 DIAGNOSIS — D8989 Other specified disorders involving the immune mechanism, not elsewhere classified: Secondary | ICD-10-CM

## 2017-09-14 LAB — CBC WITH DIFFERENTIAL (CANCER CENTER ONLY)
Basophils Absolute: 0 10*3/uL (ref 0.0–0.1)
Basophils Relative: 0 %
EOS ABS: 0 10*3/uL (ref 0.0–0.5)
Eosinophils Relative: 1 %
HEMATOCRIT: 25.1 % — AB (ref 34.8–46.6)
HEMOGLOBIN: 8.2 g/dL — AB (ref 11.6–15.9)
LYMPHS ABS: 0.4 10*3/uL — AB (ref 0.9–3.3)
Lymphocytes Relative: 33 %
MCH: 31.5 pg (ref 26.0–34.0)
MCHC: 32.7 g/dL (ref 32.0–36.0)
MCV: 96.5 fL (ref 81.0–101.0)
MONO ABS: 0.1 10*3/uL (ref 0.1–0.9)
Monocytes Relative: 10 %
NEUTROS PCT: 56 %
Neutro Abs: 0.7 10*3/uL — ABNORMAL LOW (ref 1.5–6.5)
Platelet Count: 17 10*3/uL — ABNORMAL LOW (ref 145–400)
RBC: 2.6 MIL/uL — ABNORMAL LOW (ref 3.70–5.32)
RDW: 19.4 % — AB (ref 11.1–15.7)
WBC Count: 1.3 10*3/uL — ABNORMAL LOW (ref 3.9–10.0)

## 2017-09-14 LAB — CMP (CANCER CENTER ONLY)
ALBUMIN: 2.9 g/dL — AB (ref 3.5–5.0)
ALT: 21 U/L (ref 10–47)
AST: 22 U/L (ref 11–38)
Alkaline Phosphatase: 61 U/L (ref 26–84)
Anion gap: 10 (ref 5–15)
BILIRUBIN TOTAL: 0.7 mg/dL (ref 0.2–1.6)
BUN: 48 mg/dL — AB (ref 7–22)
CALCIUM: 8.9 mg/dL (ref 8.0–10.3)
CHLORIDE: 108 mmol/L (ref 98–108)
CO2: 22 mmol/L (ref 18–33)
CREATININE: 2.7 mg/dL — AB (ref 0.60–1.20)
GLUCOSE: 104 mg/dL (ref 73–118)
Potassium: 3.1 mmol/L — ABNORMAL LOW (ref 3.3–4.7)
SODIUM: 140 mmol/L (ref 128–145)
Total Protein: 6 g/dL — ABNORMAL LOW (ref 6.4–8.1)

## 2017-09-14 MED ORDER — SODIUM CHLORIDE 0.9 % IV SOLN
15.6000 mg/kg | Freq: Once | INTRAVENOUS | Status: AC
  Administered 2017-09-14: 700 mg via INTRAVENOUS
  Filled 2017-09-14: qty 20

## 2017-09-14 MED ORDER — ACETAMINOPHEN 325 MG PO TABS
ORAL_TABLET | ORAL | Status: AC
Start: 2017-09-14 — End: ?
  Filled 2017-09-14: qty 2

## 2017-09-14 MED ORDER — METHYLPREDNISOLONE SODIUM SUCC 125 MG IJ SOLR
125.0000 mg | Freq: Once | INTRAMUSCULAR | Status: AC
  Administered 2017-09-14: 125 mg via INTRAVENOUS

## 2017-09-14 MED ORDER — DARBEPOETIN ALFA 300 MCG/0.6ML IJ SOSY
PREFILLED_SYRINGE | INTRAMUSCULAR | Status: AC
  Filled 2017-09-14: qty 0.6

## 2017-09-14 MED ORDER — ACETAMINOPHEN 325 MG PO TABS
650.0000 mg | ORAL_TABLET | Freq: Once | ORAL | Status: AC
  Administered 2017-09-14: 650 mg via ORAL

## 2017-09-14 MED ORDER — DIPHENHYDRAMINE HCL 25 MG PO CAPS
50.0000 mg | ORAL_CAPSULE | Freq: Once | ORAL | Status: AC
  Administered 2017-09-14: 50 mg via ORAL

## 2017-09-14 MED ORDER — PROCHLORPERAZINE MALEATE 10 MG PO TABS
10.0000 mg | ORAL_TABLET | Freq: Once | ORAL | Status: AC
  Administered 2017-09-14: 10 mg via ORAL

## 2017-09-14 MED ORDER — DIPHENHYDRAMINE HCL 25 MG PO CAPS
ORAL_CAPSULE | ORAL | Status: AC
Start: 2017-09-14 — End: ?
  Filled 2017-09-14: qty 2

## 2017-09-14 MED ORDER — PROCHLORPERAZINE MALEATE 10 MG PO TABS
ORAL_TABLET | ORAL | Status: AC
  Filled 2017-09-14: qty 1

## 2017-09-14 MED ORDER — SODIUM CHLORIDE 0.9 % IV SOLN
Freq: Once | INTRAVENOUS | Status: AC
  Administered 2017-09-14: 10:00:00 via INTRAVENOUS

## 2017-09-14 MED ORDER — DARBEPOETIN ALFA 300 MCG/0.6ML IJ SOSY
300.0000 ug | PREFILLED_SYRINGE | Freq: Once | INTRAMUSCULAR | Status: AC
  Administered 2017-09-14: 300 ug via SUBCUTANEOUS

## 2017-09-14 MED ORDER — SODIUM CHLORIDE 0.9 % IV SOLN
1000.0000 mL | Freq: Once | INTRAVENOUS | Status: AC
  Administered 2017-09-14: 1000 mL via INTRAVENOUS

## 2017-09-14 MED ORDER — METHYLPREDNISOLONE SODIUM SUCC 125 MG IJ SOLR
INTRAMUSCULAR | Status: AC
  Filled 2017-09-14: qty 2

## 2017-09-14 NOTE — Progress Notes (Signed)
Ok to treat with ANC 0.7, Platelet 17 per Dr. Marin Olp.   Patient blood pressure before darzalex started 86/52. Ok to start Darzalex, and initiate IV fluids during darzalex infusion per Dr. Marin Olp.

## 2017-09-14 NOTE — Progress Notes (Signed)
Hematology and Oncology Follow Up Visit  Jeanne Martinez [id number] January 25, 1951 67 y.o. 09/14/2017   Principle Diagnosis:  IgA Kappa myeloma - Relapsed Renal Failure due to light chain deposition Anemia secondary to renal failure  Past Therapy: Ninlaro/Pomalidomide - s/p cycle #4 - - d/c'ed on 07/04/2017 CyBorD - s/p cycle #2 --discontinued due to progression Kyprolis/Cytoxan/Decadron --start cycle 1 on 08/09/2017  Current Therapy:   Aranesp 300 mcg subcu for hemoglobin less than 10 Daratumumab (started on 08/31/2017) s/p cycle #2   Interim History:  Jeanne Martinez is here today with her daughter and husband for follow-up.  So far, she has tolerated the daratumumab fairly well.  I think it is too early to tell if this is helping.  We did try to get a Port-A-Cath placed into her a couple days ago.  Her platelet count was only 20,000.  I think this could be reflective of the myeloma still being quite active.  She says she feels okay.  She says she is eating okay.  She has had no nausea or vomiting.  She has had no rashes.  She is had no leg swelling.  It might be some slight swelling on her feet.  She is urinating without any difficulties.  She is having no diarrhea.  We ultimately have to get a Port-A-Cath into her.  Hopefully, she will get one on June 25.  We will transfuse her with platelets the day before to try to ensure that she has adequate platelet numbers.  She is had no fever.  Overall, her performance status is ECOG 2.   Medications:  Allergies as of 09/14/2017      Reactions   Milk-related Compounds Anaphylaxis   Throat closes       Medication List        Accurate as of 09/14/17 11:25 AM. Always use your most recent med list.          aspirin EC 81 MG tablet Take 81 mg by mouth daily.   dexamethasone 4 MG tablet Commonly known as:  DECADRON TAKE 3 TABLETS ONCE WEEKLY WITH FOOD   famciclovir 250 MG tablet Commonly known as:  FAMVIR Take 1 tablet (250  mg total) by mouth daily.   lidocaine-prilocaine cream Commonly known as:  EMLA Apply 1 application topically as needed. Apply to port one hour before appointment   LORazepam 0.5 MG tablet Commonly known as:  ATIVAN Take 0.5 mg by mouth every 6 (six) hours as needed (NAUSEA AND VOMITING).   magnesium oxide 400 MG tablet Commonly known as:  MAG-OX Take 400 mg by mouth daily.   montelukast 10 MG tablet Commonly known as:  SINGULAIR Take 1 tablet (10 mg total) by mouth at bedtime.   multivitamin tablet Take 1 tablet by mouth daily.   prochlorperazine 10 MG tablet Commonly known as:  COMPAZINE Take 10 mg by mouth every 6 (six) hours as needed for nausea or vomiting.   Vitamin D 2000 units tablet Take 2,000 Units by mouth daily.       Allergies:  Allergies  Allergen Reactions  . Milk-Related Compounds Anaphylaxis    Throat closes     Past Medical History, Surgical history, Social history, and Family History were reviewed and updated.  Review of Systems: Review of Systems  Constitutional: Positive for malaise/fatigue.  HENT: Negative.   Eyes: Negative.   Respiratory: Negative.   Cardiovascular: Negative.   Gastrointestinal: Negative.   Genitourinary: Negative.   Musculoskeletal: Negative.   Skin: Negative.  Neurological: Negative.   Endo/Heme/Allergies: Negative.   Psychiatric/Behavioral: Negative.      Physical Exam:  weight is 96 lb 6.4 oz (43.7 kg). Her oral temperature is 98.7 F (37.1 C). Her blood pressure is 103/54 (abnormal) and her pulse is 95. Her respiration is 18 and oxygen saturation is 100%.   Wt Readings from Last 3 Encounters:  09/14/17 96 lb 6.4 oz (43.7 kg)  09/13/17 95 lb (43.1 kg)  09/07/17 95 lb (43.1 kg)    Physical Exam  Constitutional: She is oriented to person, place, and time.  HENT:  Head: Normocephalic and atraumatic.  Mouth/Throat: Oropharynx is clear and moist.  Eyes: Pupils are equal, round, and reactive to light. EOM  are normal.  Neck: Normal range of motion.  Cardiovascular: Normal rate, regular rhythm and normal heart sounds.  Pulmonary/Chest: Effort normal and breath sounds normal.  Abdominal: Soft. Bowel sounds are normal.  Musculoskeletal: Normal range of motion. She exhibits no edema, tenderness or deformity.  Lymphadenopathy:    She has no cervical adenopathy.  Neurological: She is alert and oriented to person, place, and time.  Skin: Skin is warm and dry. No rash noted. No erythema.  Psychiatric: She has a normal mood and affect. Her behavior is normal. Judgment and thought content normal.  Vitals reviewed.     Lab Results  Component Value Date   WBC 1.3 (L) 09/14/2017   HGB 8.2 (L) 09/14/2017   HCT 25.1 (L) 09/14/2017   MCV 96.5 09/14/2017   PLT 17 (L) 09/14/2017   Lab Results  Component Value Date   FERRITIN 1,608 (H) 08/23/2017   IRON 98 08/23/2017   TIBC 210 (L) 08/23/2017   UIBC 112 08/23/2017   IRONPCTSAT 47 08/23/2017   Lab Results  Component Value Date   RBC 2.60 (L) 09/14/2017   Lab Results  Component Value Date   KPAFRELGTCHN 7,992.7 (H) 09/07/2017   LAMBDASER <1.5 (L) 09/07/2017   KAPLAMBRATIO See below. 09/07/2017   Lab Results  Component Value Date   IGGSERUM 120 (L) 09/07/2017   IGA <5 (L) 09/07/2017   IGMSERUM <5 (L) 09/07/2017   Lab Results  Component Value Date   TOTALPROTELP 5.6 (L) 08/23/2017   ALBUMINELP 3.3 08/23/2017   A1GS 0.3 08/23/2017   A2GS 1.0 08/23/2017   BETS 0.8 08/23/2017   GAMS 0.2 (L) 08/23/2017   MSPIKE 0.1 (H) 08/23/2017     Chemistry      Component Value Date/Time   NA 140 09/14/2017 0842   NA 148 (H) 03/17/2017 1210   K 3.1 (L) 09/14/2017 0842   K 4.4 03/17/2017 1210   CL 108 09/14/2017 0842   CL 106 03/17/2017 1210   CO2 22 09/14/2017 0842   CO2 27 03/17/2017 1210   BUN 48 (H) 09/14/2017 0842   BUN 23 (H) 03/17/2017 1210   CREATININE 2.70 (H) 09/14/2017 0842   CREATININE 1.1 03/17/2017 1210      Component Value  Date/Time   CALCIUM 8.9 09/14/2017 0842   CALCIUM 9.1 03/17/2017 1210   ALKPHOS 61 09/14/2017 0842   ALKPHOS 52 03/17/2017 1210   AST 22 09/14/2017 0842   ALT 21 09/14/2017 0842   ALT 21 03/17/2017 1210   BILITOT 0.7 09/14/2017 0842      Impression and Plan: Jeanne Martinez is a very pleasant 67 yo caucasian female with IgA kappa myeloma with 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   I think that we will know within the next couple  weeks as to what is going on.  I really want to give the daratumumab a chance.  I really do not have many other options for her.  I do not think that she is a good candidate for aggressive chemotherapy for this myeloma.  Her creatinine, being more elevated, is a worry.  We will still proceed with treatment today.  We actually may need to think about giving her a dose of Neulasta to help with her white cell count.  We will see about getting her back next week.  This is a incredibly complicated.  We spent a good 35 minutes with her today.  She does have an element of dementia which does not help as she oftentimes forgets what we have told her.Volanda Napoleon, MD 6/20/201911:25 AM

## 2017-09-14 NOTE — Patient Instructions (Signed)
[facility] Discharge Instructions for Patients Receiving Chemotherapy  Today you received the following chemotherapy agents:  Darzalex  To help prevent nausea and vomiting after your treatment, we encourage you to take your nausea medication as prescribed.   If you develop nausea and vomiting that is not controlled by your nausea medication, call the clinic.   BELOW ARE SYMPTOMS THAT SHOULD BE REPORTED IMMEDIATELY:  *FEVER GREATER THAN 100.5 F  *CHILLS WITH OR WITHOUT FEVER  NAUSEA AND VOMITING THAT IS NOT CONTROLLED WITH YOUR NAUSEA MEDICATION  *UNUSUAL SHORTNESS OF BREATH  *UNUSUAL BRUISING OR BLEEDING  TENDERNESS IN MOUTH AND THROAT WITH OR WITHOUT PRESENCE OF ULCERS  *URINARY PROBLEMS  *BOWEL PROBLEMS  UNUSUAL RASH Items with * indicate a potential emergency and should be followed up as soon as possible.  Feel free to call the clinic should you have any questions or concerns. The clinic phone number is [phone].  Please show the CHEMO ALERT CARD at check-in to the Emergency Department and triage nurse.   

## 2017-09-17 ENCOUNTER — Other Ambulatory Visit: Payer: Self-pay | Admitting: Radiology

## 2017-09-18 ENCOUNTER — Telehealth: Payer: Self-pay | Admitting: *Deleted

## 2017-09-18 ENCOUNTER — Other Ambulatory Visit: Payer: Self-pay

## 2017-09-18 ENCOUNTER — Inpatient Hospital Stay: Payer: Medicare Other

## 2017-09-18 DIAGNOSIS — C9000 Multiple myeloma not having achieved remission: Secondary | ICD-10-CM

## 2017-09-18 DIAGNOSIS — Z5112 Encounter for antineoplastic immunotherapy: Secondary | ICD-10-CM | POA: Diagnosis not present

## 2017-09-18 LAB — CBC WITH DIFFERENTIAL (CANCER CENTER ONLY)
Basophils Absolute: 0 10*3/uL (ref 0.0–0.1)
Basophils Relative: 0 %
EOS PCT: 1 %
Eosinophils Absolute: 0 10*3/uL (ref 0.0–0.5)
HCT: 21.6 % — ABNORMAL LOW (ref 34.8–46.6)
Hemoglobin: 7.1 g/dL — ABNORMAL LOW (ref 11.6–15.9)
LYMPHS ABS: 0.4 10*3/uL — AB (ref 0.9–3.3)
Lymphocytes Relative: 25 %
MCH: 31.8 pg (ref 26.0–34.0)
MCHC: 32.9 g/dL (ref 32.0–36.0)
MCV: 96.9 fL (ref 81.0–101.0)
MONOS PCT: 12 %
Monocytes Absolute: 0.2 10*3/uL (ref 0.1–0.9)
NEUTROS PCT: 62 %
Neutro Abs: 1 10*3/uL — ABNORMAL LOW (ref 1.5–6.5)
PLATELETS: 6 10*3/uL — AB (ref 145–400)
RBC: 2.23 MIL/uL — ABNORMAL LOW (ref 3.70–5.32)
RDW: 19.3 % — AB (ref 11.1–15.7)
WBC: 1.5 10*3/uL — AB (ref 3.9–10.0)

## 2017-09-18 LAB — CMP (CANCER CENTER ONLY)
ALT: 19 U/L (ref 10–47)
ANION GAP: 14 (ref 5–15)
AST: 22 U/L (ref 11–38)
Albumin: 2.9 g/dL — ABNORMAL LOW (ref 3.5–5.0)
Alkaline Phosphatase: 61 U/L (ref 26–84)
BUN: 47 mg/dL — ABNORMAL HIGH (ref 7–22)
CO2: 21 mmol/L (ref 18–33)
Calcium: 9 mg/dL (ref 8.0–10.3)
Chloride: 106 mmol/L (ref 98–108)
Creatinine: 2.7 mg/dL — ABNORMAL HIGH (ref 0.60–1.20)
GLUCOSE: 94 mg/dL (ref 73–118)
POTASSIUM: 3.6 mmol/L (ref 3.3–4.7)
Sodium: 141 mmol/L (ref 128–145)
Total Bilirubin: 0.7 mg/dL (ref 0.2–1.6)
Total Protein: 5.6 g/dL — ABNORMAL LOW (ref 6.4–8.1)

## 2017-09-18 LAB — PLATELET BY CITRATE

## 2017-09-18 MED ORDER — DIPHENHYDRAMINE HCL 25 MG PO CAPS
ORAL_CAPSULE | ORAL | Status: AC
  Filled 2017-09-18: qty 1

## 2017-09-18 MED ORDER — SODIUM CHLORIDE 0.9 % IV SOLN: 250.0000 mL | Freq: Once | INTRAVENOUS | Status: DC

## 2017-09-18 MED ORDER — ACETAMINOPHEN 325 MG PO TABS
650.0000 mg | ORAL_TABLET | Freq: Once | ORAL | Status: AC
  Administered 2017-09-18: 650 mg via ORAL

## 2017-09-18 MED ORDER — METHYLPREDNISOLONE SODIUM SUCC 40 MG IJ SOLR
INTRAMUSCULAR | Status: AC
  Filled 2017-09-18: qty 2

## 2017-09-18 MED ORDER — DIPHENHYDRAMINE HCL 25 MG PO CAPS
25.0000 mg | ORAL_CAPSULE | Freq: Once | ORAL | Status: AC
  Administered 2017-09-18: 25 mg via ORAL

## 2017-09-18 MED ORDER — ACETAMINOPHEN 325 MG PO TABS
ORAL_TABLET | ORAL | Status: AC
  Filled 2017-09-18: qty 2

## 2017-09-18 MED ORDER — METHYLPREDNISOLONE SODIUM SUCC 125 MG IJ SOLR
80.0000 mg | Freq: Once | INTRAMUSCULAR | Status: AC
  Administered 2017-09-18: 80 mg via INTRAVENOUS

## 2017-09-18 NOTE — Telephone Encounter (Signed)
Critical Value Platelets 6 Dr Marin Olp notified. Patient is already here for platelet transfusion. No additional orders.

## 2017-09-18 NOTE — Patient Instructions (Signed)
Platelet Transfusion A platelet transfusion is a procedure in which you receive donated platelets through an IV tube. Platelets are tiny pieces of blood cells. When a blood vessel is damaged, platelets collect in the damaged area to help form a blood clot. This begins the healing process. If your platelet count gets too low, your blood may have trouble clotting. You may need a platelet transfusion if you have a condition that causes a low number of platelets (thrombocytopenia). A platelet transfusion may be used to stop or prevent bleeding. Tell a health care provider about:  Any allergies you have.  All medicines you are taking, including vitamins, herbs, eye drops, creams, and over-the-counter medicines.  Any problems you or family members have had with anesthetic medicines.  Any blood disorders you have.  Any surgeries you have had.  Any medical conditions you have.  Any reactions you have had during a previous transfusion. What are the risks? Generally, this is a safe procedure. However, problems may occur, including:  Fever with or without chills. The fever usually occurs within the first 4 hours of the transfusion and returns to normal within 48 hours.  Allergic reaction. The reaction is most commonly caused by antibodies your body creates against substances in the transfusion. Signs of an allergic reaction may include itching, hives, difficulty breathing, shock, or low blood pressure.  Sudden (acute) or delayed hemolytic reaction. This rare reaction can occur during the transfusion and up to 28 days after the transfusion. The reaction usually occurs when your body's defense system (immune system) attacks the new platelets. Signs of a hemolytic reaction may include fever, headache, difficulty breathing, low blood pressure, a rapid heartbeat, or pain in your back, abdomen, chest, or IV site.  Transfusion-related acute lung injury (TRALI). TRALI can occur within hours of a transfusion,  or several days later. This is a rare reaction that causes lung damage. The cause is not known.  Infection. Signs of this rare complication may include fever, chills, vomiting, a rapid heartbeat, or low blood pressure.  What happens before the procedure?  You may have a blood test to determine your blood type. This is necessary to find out what kind ofplatelets best matches your platelets.  If you have had an allergic reaction to a transfusion in the past, you may be given medicine to help prevent a reaction. Take this medicine only as directed by your health care provider.  Your temperature, blood pressure, and pulse will be monitored before the transfusion. What happens during the procedure?  An IV will be started in your hand or arm.  The transfusion will be attached to your IV tubing. The bag of donated platelets will be attached to your IV tube andgiven into your vein.  Your temperature, blood pressure, and pulse will be monitored regularly during the transfusion. This monitoring is done to help detect early signs of a transfusion reaction.  If you have any signs or symptoms of a reaction, your transfusion will be stopped and you may be given medicine.  When your transfusion is complete, your IV will be removed.  Pressure may be applied to the IV site for a few minutes.  A bandage (dressing) will be applied. The procedure may vary among health care providers and hospitals. What happens after the procedure?  Your blood pressure, temperature, and pulse will be monitored regularly. This information is not intended to replace advice given to you by your health care provider. Make sure you discuss any questions you have   with your health care provider. Document Released: 01/09/2007 Document Revised: 08/20/2015 Document Reviewed: 01/22/2014 Elsevier Interactive Patient Education  2018 [employer]  

## 2017-09-19 ENCOUNTER — Encounter (HOSPITAL_COMMUNITY): Payer: Self-pay

## 2017-09-19 ENCOUNTER — Other Ambulatory Visit: Payer: Self-pay | Admitting: Hematology & Oncology

## 2017-09-19 ENCOUNTER — Ambulatory Visit (HOSPITAL_COMMUNITY)
Admission: RE | Admit: 2017-09-19 | Discharge: 2017-09-19 | Disposition: A | Discharge status: Home / Self Care | Payer: Medicare Other | Source: Ambulatory Visit | Attending: Hematology & Oncology | Admitting: Hematology & Oncology

## 2017-09-19 DIAGNOSIS — D696 Thrombocytopenia, unspecified: Secondary | ICD-10-CM | POA: Insufficient documentation

## 2017-09-19 DIAGNOSIS — C9000 Multiple myeloma not having achieved remission: Secondary | ICD-10-CM | POA: Diagnosis not present

## 2017-09-19 DIAGNOSIS — Z7982 Long term (current) use of aspirin: Secondary | ICD-10-CM | POA: Diagnosis not present

## 2017-09-19 DIAGNOSIS — I129 Hypertensive chronic kidney disease with stage 1 through stage 4 chronic kidney disease, or unspecified chronic kidney disease: Secondary | ICD-10-CM | POA: Insufficient documentation

## 2017-09-19 DIAGNOSIS — D631 Anemia in chronic kidney disease: Secondary | ICD-10-CM | POA: Insufficient documentation

## 2017-09-19 DIAGNOSIS — N183 Chronic kidney disease, stage 3 (moderate): Secondary | ICD-10-CM | POA: Insufficient documentation

## 2017-09-19 HISTORY — PX: IR IMAGING GUIDED PORT INSERTION: IMG5740

## 2017-09-19 LAB — BASIC METABOLIC PANEL
ANION GAP: 10 (ref 5–15)
BUN: 56 mg/dL — ABNORMAL HIGH (ref 8–23)
CHLORIDE: 111 mmol/L (ref 98–111)
CO2: 20 mmol/L — AB (ref 22–32)
Calcium: 8.8 mg/dL — ABNORMAL LOW (ref 8.9–10.3)
Creatinine, Ser: 2.25 mg/dL — ABNORMAL HIGH (ref 0.44–1.00)
GFR calc non Af Amer: 21 mL/min — ABNORMAL LOW (ref >60)
GFR, EST AFRICAN AMERICAN: 25 mL/min — AB (ref >60)
GLUCOSE: 112 mg/dL — AB (ref 70–99)
Potassium: 3.4 mmol/L — ABNORMAL LOW (ref 3.5–5.1)
Sodium: 141 mmol/L (ref 135–145)

## 2017-09-19 LAB — BPAM PLATELET PHERESIS
BLOOD PRODUCT EXPIRATION DATE: 201906262359
ISSUE DATE / TIME: 201906240802
Unit Type and Rh: 6200

## 2017-09-19 LAB — CBC WITH DIFFERENTIAL/PLATELET
BASOS ABS: 0 10*3/uL (ref 0.0–0.1)
Basophils Relative: 0 %
EOS ABS: 0 10*3/uL (ref 0.0–0.7)
Eosinophils Relative: 0 %
HCT: 22.7 % — ABNORMAL LOW (ref 36.0–46.0)
Hemoglobin: 7.3 g/dL — ABNORMAL LOW (ref 12.0–15.0)
LYMPHS PCT: 15 %
Lymphs Abs: 0.4 10*3/uL — ABNORMAL LOW (ref 0.7–4.0)
MCH: 30.7 pg (ref 26.0–34.0)
MCHC: 32.2 g/dL (ref 30.0–36.0)
MCV: 95.4 fL (ref 78.0–100.0)
MONO ABS: 0.2 10*3/uL (ref 0.1–1.0)
Monocytes Relative: 9 %
NEUTROS PCT: 76 %
Neutro Abs: 2.1 10*3/uL (ref 1.7–7.7)
Platelets: 34 10*3/uL — ABNORMAL LOW (ref 150–400)
RBC: 2.38 MIL/uL — ABNORMAL LOW (ref 3.87–5.11)
RDW: 19.5 % — ABNORMAL HIGH (ref 11.5–15.5)
WBC: 2.7 10*3/uL — AB (ref 4.0–10.5)

## 2017-09-19 LAB — PROTIME-INR
INR: 1.07
Prothrombin Time: 13.8 seconds (ref 11.4–15.2)

## 2017-09-19 LAB — PREPARE PLATELET PHERESIS: UNIT DIVISION: 0

## 2017-09-19 LAB — ABO/RH: ABO/RH(D): A POS

## 2017-09-19 MED ORDER — FENTANYL CITRATE (PF) 100 MCG/2ML IJ SOLN
INTRAMUSCULAR | Status: AC
  Filled 2017-09-19: qty 2

## 2017-09-19 MED ORDER — LIDOCAINE-EPINEPHRINE (PF) 1 %-1:200000 IJ SOLN
INTRAMUSCULAR | Status: AC
  Filled 2017-09-19: qty 30

## 2017-09-19 MED ORDER — CEFAZOLIN SODIUM-DEXTROSE 2-4 GM/100ML-% IV SOLN
INTRAVENOUS | Status: AC
  Administered 2017-09-19: 2 g via INTRAVENOUS
  Filled 2017-09-19: qty 100

## 2017-09-19 MED ORDER — SODIUM CHLORIDE 0.9 % IV SOLN: INTRAVENOUS | Status: DC

## 2017-09-19 MED ORDER — HEPARIN SOD (PORK) LOCK FLUSH 100 UNIT/ML IV SOLN
INTRAVENOUS | Status: AC
  Administered 2017-09-19: 500 [IU]
  Filled 2017-09-19: qty 5

## 2017-09-19 MED ORDER — CEFAZOLIN SODIUM-DEXTROSE 2-4 GM/100ML-% IV SOLN
2.0000 g | INTRAVENOUS | Status: AC
  Administered 2017-09-19: 2 g via INTRAVENOUS

## 2017-09-19 MED ORDER — MIDAZOLAM HCL 2 MG/2ML IJ SOLN
INTRAMUSCULAR | Status: AC
  Filled 2017-09-19: qty 2

## 2017-09-19 MED ORDER — FENTANYL CITRATE (PF) 100 MCG/2ML IJ SOLN
INTRAMUSCULAR | Status: AC | PRN
  Administered 2017-09-19 (×2): 25 ug via INTRAVENOUS

## 2017-09-19 MED ORDER — LIDOCAINE HCL (PF) 1 % IJ SOLN
INTRAMUSCULAR | Status: AC | PRN
  Administered 2017-09-19: 10 mL

## 2017-09-19 MED ORDER — MIDAZOLAM HCL 2 MG/2ML IJ SOLN
INTRAMUSCULAR | Status: AC | PRN
  Administered 2017-09-19 (×2): 1 mg via INTRAVENOUS

## 2017-09-19 MED ORDER — SODIUM CHLORIDE 0.9% IV SOLUTION: Freq: Once | INTRAVENOUS | Status: DC

## 2017-09-19 NOTE — Sedation Documentation (Signed)
Platelets infused, tolerated well; will proceed with procedure

## 2017-09-19 NOTE — H&P (Addendum)
Chief Complaint: Patient was seen in consultation today for port a cath placement at the request of Ennever,Peter R  Referring Physician(s): Ennever,Peter R  Supervising Physician: Sandi Mariscal  Patient Status: [facility] - Out-pt  History of Present Illness: Jeanne Martinez is a 67 y.o. female   Multiple Myeloma Thrombocytopenia  To begin treatment/chemotherapy Thursday this week Was scheduled for [facility] 6/20 in [place]---plts 17 and procedure cancelled  Plt was 6 yesterday and transfusion in [facility]cology [facility]tr was performed  Now rescheduled to today Plts pending   Past Medical History:  Diagnosis Date  . Anemia of chronic renal failure, stage 3 (moderate) ([place]) 08/08/2017  . Cancer ([place])    multiple myeloma  . Counseling regarding goals of care 08/08/2017  . Hypertension    patient denies  . 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 Renal disorder    acute renal failure due to myeloma    Past Surgical History:  Procedure Laterality Date  . NO PAST SURGERIES      Allergies: Milk-related compounds  Medications: Prior to Admission medications   Medication Sig Start Date End Date Taking? Authorizing Provider  aspirin EC 81 MG tablet Take 81 mg by mouth daily.    [provider]  Cholecalciferol (VITAMIN D) 2000 units tablet Take 2,000 Units by mouth daily.    [provider]  dexamethasone (DECADRON) 4 MG tablet TAKE 3 TABLETS ONCE WEEKLY WITH FOOD Patient taking differently: Take 4 mg by mouth See admin instructions. TAKES 1 TABLET DAILY FOR 2 DAYS AFTER CHEMO 07/26/17   Volanda Napoleon, MD  famciclovir (FAMVIR) 250 MG tablet Take 1 tablet (250 mg total) by mouth daily. 09/07/17   Volanda Napoleon, MD  lidocaine-prilocaine (EMLA) cream Apply 1 application topically as needed. Apply to port one hour before appointment 09/01/17   Volanda Napoleon, MD  LORazepam (ATIVAN) 0.5 MG tablet Take 0.5 mg by mouth every 6 (six) hours as needed (NAUSEA AND  VOMITING).    [provider]  magnesium oxide (MAG-OX) 400 MG tablet Take 400 mg by mouth daily.    [provider]  montelukast (SINGULAIR) 10 MG tablet Take 1 tablet (10 mg total) by mouth at bedtime. 08/28/17   Volanda Napoleon, MD  Multiple Vitamin (MULTIVITAMIN) tablet Take 1 tablet by mouth daily.    [provider]  prochlorperazine (COMPAZINE) 10 MG tablet Take 10 mg by mouth every 6 (six) hours as needed for nausea or vomiting.    [provider]     Family History  Problem Relation Age of Onset  . Multiple myeloma Mother   . Cancer Father     Social History   Socioeconomic History  . Marital status: Married    Spouse name: Not on file  . Number of children: Not on file  . Years of education: Not on file  . Highest education level: Not on file  Occupational History  . Occupation: retired    Comment: Designer, television/film set  Social Needs  . Financial resource strain: Not on file  . Food insecurity:    Worry: Not on file    Inability: Not on file  . Transportation needs:    Medical: Not on file    Non-medical: Not on file  Tobacco Use  . Smoking status: Never Smoker  . Smokeless tobacco: Never Used  Substance and Sexual Activity  . Alcohol use: No  . Drug use: No  . Sexual activity: Not on  file  Lifestyle  . Physical activity:    Days per week: Not on file    Minutes per session: Not on file  . Stress: Not on file  Relationships  . Social connections:    Talks on phone: Not on file    Gets together: Not on file    Attends religious service: Not on file    Active member of club or organization: Not on file    Attends meetings of clubs or organizations: Not on file    Relationship status: Not on file  Other Topics Concern  . Not on file  Social History Narrative  . Not on file     Review of Systems: A 12 point ROS discussed and pertinent positives are indicated in the HPI above.  All other systems are  negative.  Review of Systems  Constitutional: Positive for fatigue. Negative for activity change and fever.  Respiratory: Negative for shortness of breath.   Gastrointestinal: Negative for abdominal pain.  Neurological: Positive for weakness.  Psychiatric/Behavioral: Negative for behavioral problems and confusion.    Vital Signs: BP 123/68   Pulse 87   Temp 97.6 F (36.4 C)   Resp 18   Ht 5' 2"  (1.575 m)   Wt 97 lb (44 kg)   SpO2 100%   BMI 17.74 kg/m   Physical Exam  Constitutional: She is oriented to person, place, and time.  Extremely thin female In NAD  Cardiovascular: Normal rate and regular rhythm.  Pulmonary/Chest: Effort normal and breath sounds normal.  Abdominal: Soft. Bowel sounds are normal.  Musculoskeletal: Normal range of motion.  Neurological: She is alert and oriented to person, place, and time.  Skin: Skin is warm and dry.  Psychiatric: She has a normal mood and affect. Her behavior is normal. Judgment and thought content normal.  Nursing note and vitals reviewed.   Imaging: No results found.  Labs:  CBC: Recent Labs    09/07/17 0830 09/13/17 1044 09/14/17 0842 09/18/17 1347  WBC 2.0* 1.3* 1.3* 1.5*  HGB 8.8* 7.8* 8.2* 7.1*  HCT 27.4* 23.9* 25.1* 21.6*  PLT 38* 23* 17* 6*    COAGS: Recent Labs    01/23/17 0909 09/13/17 1044  INR 0.91 1.05    BMP: Recent Labs    09/07/17 0830 09/13/17 1044 09/14/17 0842 09/18/17 1347  NA 140 140 140 141  K 4.3 3.4* 3.1* 3.6  CL 104 110 108 106  CO2 25 21* 22 21  GLUCOSE 96 97 104 94  BUN 39* 53* 48* 47*  CALCIUM 9.3 8.5* 8.9 9.0  CREATININE 2.10* 2.57* 2.70* 2.70*  GFRNONAA  --  18*  --   --   GFRAA  --  21*  --   --     LIVER FUNCTION TESTS: Recent Labs    08/31/17 0810 09/07/17 0830 09/14/17 0842 09/18/17 1347  BILITOT 0.5 0.7 0.7 0.7  AST 20 23 22 22   ALT 19 27 21 19   ALKPHOS 47 53 61 61  PROT 5.9* 6.0* 6.0* 5.6*  ALBUMIN 2.9* 3.1* 2.9* 2.9*    TUMOR MARKERS: No  results for input(s): AFPTM, CEA, CA199, CHROMGRNA in the last 8760 hours.  Assessment and Plan:  Multiple Myeloma Treatment to begin this week Thrombocytopenia; Plt 6 yesterday Has had transfusion yesterday in [place] Plts now For PAC placement in IR Risks and benefits of image guided port-a-catheter placement was discussed with the patient including, but not limited to bleeding, infection, pneumothorax, or fibrin  sheath development and need for additional procedures.  All of the patient's questions were answered, patient is agreeable to proceed. Consent signed and in chart.    Thank you for this interesting consult.  I greatly enjoyed meeting DECLYN OFFIELD and look forward to participating in their care.  A copy of this report was sent to the requesting provider on this date.  Electronically Signed: Lavonia Drafts, PA-C 09/19/2017, 10:53 AM   I spent a total of    25 Minutes in face to face in clinical consultation, greater than 50% of which was counseling/coordinating care for [facility] placement

## 2017-09-19 NOTE — Sedation Documentation (Signed)
Platelets completely infused

## 2017-09-19 NOTE — Discharge Instructions (Signed)
**Note -identified via Obfuscation** Implanted Port Insertion, Care After °This sheet gives you information about how to care for yourself after your procedure. Your health care provider may also give you more specific instructions. If you have problems or questions, contact your health care provider. °What can I expect after the procedure? °After your procedure, it is common to have: °· Discomfort at the port insertion site. °· Bruising on the skin over the port. This should improve over 3-4 days. ° °Follow these instructions at home: °Port care °· After your port is placed, you will get a manufacturer's information card. The card has information about your port. Keep this card with you at all times. °· Take care of the port as told by your health care provider. Ask your health care provider if you or a family member can get training for taking care of the port at home. A home health care nurse may also take care of the port. °· Make sure to remember what type of port you have. °Incision care °· Follow instructions from your health care provider about how to take care of your port insertion site. Make sure you: °? Wash your hands with soap and water before you change your bandage (dressing). If soap and water are not available, use hand sanitizer. °? Change your dressing as told by your health care provider. °? Leave stitches (sutures), skin glue, or adhesive strips in place. These skin closures may need to stay in place for 2 weeks or longer. If adhesive strip edges start to loosen and curl up, you may trim the loose edges. Do not remove adhesive strips completely unless your health care provider tells you to do that. °· Check your port insertion site every day for signs of infection. Check for: °? More redness, swelling, or pain. °? More fluid or blood. °? Warmth. °? Pus or a bad smell. °General instructions °· Do not take baths, swim, or use a hot tub until your health care provider approves. °· Do not lift anything that is heavier than 10 lb (4.5  kg) for a week, or as told by your health care provider. °· Ask your health care provider when it is okay to: °? Return to work or school. °? Resume usual physical activities or sports. °· Do not drive for 24 hours if you were given a medicine to help you relax (sedative). °· Take over-the-counter and prescription medicines only as told by your health care provider. °· Wear a medical alert bracelet in case of an emergency. This will tell any health care providers that you have a port. °· Keep all follow-up visits as told by your health care provider. This is important. °Contact a health care provider if: °· You cannot flush your port with saline as directed, or you cannot draw blood from the port. °· You have a fever or chills. °· You have more redness, swelling, or pain around your port insertion site. °· You have more fluid or blood coming from your port insertion site. °· Your port insertion site feels warm to the touch. °· You have pus or a bad smell coming from the port insertion site. °Get help right away if: °· You have chest pain or shortness of breath. °· You have bleeding from your port that you cannot control. °Summary °· Take care of the port as told by your health care provider. °· Change your dressing as told by your health care provider. °· Keep all follow-up visits as told by your health care provider. ° **Note -identified via Obfuscation** This information is not intended to replace advice given to you by your health care provider. Make sure you discuss any questions you have with your health care provider. °Document Released: 01/02/2013 Document Revised: 02/03/2016 Document Reviewed: 02/03/2016 °Elsevier Interactive Patient Education © 2017 Elsevier Inc. ° °

## 2017-09-19 NOTE — Sedation Documentation (Signed)
Patient is resting comfortably. 

## 2017-09-19 NOTE — Sedation Documentation (Signed)
Once platelets infused- will do procedure

## 2017-09-19 NOTE — Procedures (Signed)
Pre Procedure Dx: poor venous access Post Procedural Dx: Same  Successful placement of right IJ approach port-a-cath with tip at the superior caval atrial junction. The catheter is ready for immediate use.  Estimated Blood Loss: Minimal  Complications: None immediate.  Ronny Bacon, MD Pager #: [phone]

## 2017-09-19 NOTE — Sedation Documentation (Signed)
ED Provider at bedside. 

## 2017-09-20 ENCOUNTER — Other Ambulatory Visit: Payer: Medicare Other

## 2017-09-20 ENCOUNTER — Ambulatory Visit: Payer: Medicare Other

## 2017-09-20 LAB — BPAM PLATELET PHERESIS
Blood Product Expiration Date: 201906262359
ISSUE DATE / TIME: 201906251424
UNIT TYPE AND RH: 5100

## 2017-09-20 LAB — PREPARE PLATELET PHERESIS: Unit division: 0

## 2017-09-20 LAB — PATHOLOGIST SMEAR REVIEW

## 2017-09-21 ENCOUNTER — Encounter: Payer: Self-pay | Admitting: Family

## 2017-09-21 ENCOUNTER — Inpatient Hospital Stay: Payer: Medicare Other

## 2017-09-21 ENCOUNTER — Inpatient Hospital Stay (HOSPITAL_BASED_OUTPATIENT_CLINIC_OR_DEPARTMENT_OTHER): Payer: Medicare Other | Admitting: Family

## 2017-09-21 ENCOUNTER — Other Ambulatory Visit: Payer: Self-pay | Admitting: *Deleted

## 2017-09-21 ENCOUNTER — Telehealth: Payer: Self-pay | Admitting: *Deleted

## 2017-09-21 ENCOUNTER — Ambulatory Visit: Payer: Medicare Other

## 2017-09-21 ENCOUNTER — Other Ambulatory Visit: Payer: Self-pay

## 2017-09-21 VITALS — BP 103/62 | HR 83 | Temp 97.9°F | Resp 16

## 2017-09-21 VITALS — BP 101/63 | HR 98 | Temp 98.0°F | Resp 17 | Wt 96.0 lb

## 2017-09-21 DIAGNOSIS — Z5112 Encounter for antineoplastic immunotherapy: Secondary | ICD-10-CM | POA: Diagnosis not present

## 2017-09-21 DIAGNOSIS — C9000 Multiple myeloma not having achieved remission: Secondary | ICD-10-CM

## 2017-09-21 DIAGNOSIS — D649 Anemia, unspecified: Secondary | ICD-10-CM

## 2017-09-21 LAB — CBC WITH DIFFERENTIAL (CANCER CENTER ONLY)
BASOS ABS: 0 10*3/uL (ref 0.0–0.1)
Basophils Relative: 0 %
Eosinophils Absolute: 0 10*3/uL (ref 0.0–0.5)
Eosinophils Relative: 1 %
HEMATOCRIT: 17.1 % — AB (ref 34.8–46.6)
Hemoglobin: 5.6 g/dL — CL (ref 11.6–15.9)
Lymphocytes Relative: 22 %
Lymphs Abs: 0.7 10*3/uL — ABNORMAL LOW (ref 0.9–3.3)
MCH: 31.6 pg (ref 26.0–34.0)
MCHC: 32.7 g/dL (ref 32.0–36.0)
MCV: 96.6 fL (ref 81.0–101.0)
MONO ABS: 0.2 10*3/uL (ref 0.1–0.9)
Monocytes Relative: 7 %
Neutro Abs: 2.2 10*3/uL (ref 1.5–6.5)
Neutrophils Relative %: 70 %
Platelet Count: 27 10*3/uL — ABNORMAL LOW (ref 145–400)
RBC: 1.77 MIL/uL — AB (ref 3.70–5.32)
RDW: 19.7 % — AB (ref 11.1–15.7)
WBC Count: 3.1 10*3/uL — ABNORMAL LOW (ref 3.9–10.0)

## 2017-09-21 LAB — CMP (CANCER CENTER ONLY)
ALT: 17 U/L (ref 10–47)
ANION GAP: 11 (ref 5–15)
AST: 22 U/L (ref 11–38)
Albumin: 2.5 g/dL — ABNORMAL LOW (ref 3.5–5.0)
Alkaline Phosphatase: 51 U/L (ref 26–84)
BILIRUBIN TOTAL: 0.5 mg/dL (ref 0.2–1.6)
BUN: 57 mg/dL — ABNORMAL HIGH (ref 7–22)
CALCIUM: 8.6 mg/dL (ref 8.0–10.3)
CO2: 23 mmol/L (ref 18–33)
Chloride: 105 mmol/L (ref 98–108)
Creatinine: 2.3 mg/dL — ABNORMAL HIGH (ref 0.60–1.20)
Glucose, Bld: 116 mg/dL (ref 73–118)
Potassium: 3.2 mmol/L — ABNORMAL LOW (ref 3.3–4.7)
Sodium: 139 mmol/L (ref 128–145)
TOTAL PROTEIN: 5.3 g/dL — AB (ref 6.4–8.1)

## 2017-09-21 LAB — SAMPLE TO BLOOD BANK

## 2017-09-21 LAB — PREPARE RBC (CROSSMATCH)

## 2017-09-21 LAB — LACTATE DEHYDROGENASE: LDH: 194 U/L — ABNORMAL HIGH (ref 98–192)

## 2017-09-21 MED ORDER — SODIUM CHLORIDE 0.9 % IV SOLN
15.5000 mg/kg | Freq: Once | INTRAVENOUS | Status: AC
  Administered 2017-09-21: 700 mg via INTRAVENOUS
  Filled 2017-09-21: qty 35

## 2017-09-21 MED ORDER — DIPHENHYDRAMINE HCL 25 MG PO CAPS
50.0000 mg | ORAL_CAPSULE | Freq: Once | ORAL | Status: AC
  Administered 2017-09-21: 50 mg via ORAL

## 2017-09-21 MED ORDER — DIPHENHYDRAMINE HCL 25 MG PO CAPS
ORAL_CAPSULE | ORAL | Status: AC
  Filled 2017-09-21: qty 2

## 2017-09-21 MED ORDER — METHYLPREDNISOLONE SODIUM SUCC 125 MG IJ SOLR
INTRAMUSCULAR | Status: AC
  Filled 2017-09-21: qty 2

## 2017-09-21 MED ORDER — PROCHLORPERAZINE MALEATE 10 MG PO TABS
ORAL_TABLET | ORAL | Status: AC
  Filled 2017-09-21: qty 1

## 2017-09-21 MED ORDER — METHYLPREDNISOLONE SODIUM SUCC 125 MG IJ SOLR
125.0000 mg | Freq: Once | INTRAMUSCULAR | Status: AC
Start: 2017-09-21 — End: 2017-09-21
  Administered 2017-09-21: 125 mg via INTRAVENOUS

## 2017-09-21 MED ORDER — ACETAMINOPHEN 325 MG PO TABS
ORAL_TABLET | ORAL | Status: AC
Start: 2017-09-21 — End: ?
  Filled 2017-09-21: qty 2

## 2017-09-21 MED ORDER — HEPARIN SOD (PORK) LOCK FLUSH 100 UNIT/ML IV SOLN
500.0000 [IU] | Freq: Once | INTRAVENOUS | Status: AC | PRN
  Administered 2017-09-21: 500 [IU]
  Filled 2017-09-21: qty 5

## 2017-09-21 MED ORDER — SODIUM CHLORIDE 0.9% FLUSH
10.0000 mL | INTRAVENOUS | Status: DC | PRN
  Administered 2017-09-21: 10 mL
  Filled 2017-09-21: qty 10

## 2017-09-21 MED ORDER — SODIUM CHLORIDE 0.9 % IV SOLN
Freq: Once | INTRAVENOUS | Status: AC
  Administered 2017-09-21: 10:00:00 via INTRAVENOUS

## 2017-09-21 MED ORDER — PROCHLORPERAZINE MALEATE 10 MG PO TABS
10.0000 mg | ORAL_TABLET | Freq: Once | ORAL | Status: AC
  Administered 2017-09-21: 10 mg via ORAL

## 2017-09-21 MED ORDER — ACETAMINOPHEN 325 MG PO TABS
650.0000 mg | ORAL_TABLET | Freq: Once | ORAL | Status: AC
  Administered 2017-09-21: 650 mg via ORAL

## 2017-09-21 NOTE — Progress Notes (Signed)
Hematology and Oncology Follow Up Visit  Jeanne Martinez [id number] June 27, 1950 67 y.o. 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 She is still doing well and has lots of energy. She is currently working on arranging her walk in closet by color.  She was able to get her port a cath earlier this week and the site is clean, dry, intact and appears to be healing well. Her nurse was able to access her without any problems.  She is eating well and staying hydrated. Her weight is stable.  No fever, chills, n/v, cough, rash, dizziness, SOB, chest pain, palpitations, abdominal pain or changes in bowel or bladder habits. No diarrhea.  The puffiness in her feet and ankles comes and goes. She has noticed numbness and tingling in her toes this morning. No weakness or tenderness in her extremities.  No falls or syncopal episodes.  No lymphadenopathy noted on exam.   ECOG Performance Status: 2 - Symptomatic, <50% confined to bed  Medications:  Allergies as of 09/21/2017      Reactions   Milk-related Compounds Anaphylaxis   Throat closes       Medication List        Accurate as of 09/21/17  8:48 AM. Always use your most recent med list.          aspirin EC 81 MG tablet Take 81 mg by mouth daily.   dexamethasone 4 MG tablet Commonly known as:  DECADRON TAKE 3 TABLETS ONCE WEEKLY WITH FOOD   famciclovir 250 MG tablet Commonly known as:  FAMVIR Take 1 tablet (250 mg total) by mouth daily.   lidocaine-prilocaine cream Commonly known as:  EMLA Apply 1 application topically as needed. Apply to port one hour before appointment   LORazepam 0.5 MG tablet Commonly known as:  ATIVAN Take 0.5 mg by mouth every 6 (six) hours as needed (NAUSEA AND VOMITING).   magnesium oxide 400 MG tablet Commonly known as:  MAG-OX Take 400 mg by mouth daily.   montelukast 10 MG tablet Commonly known as:  SINGULAIR TAKE 1 TABLET BY MOUTH EVERYDAY AT BEDTIME   multivitamin tablet Take 1 tablet by mouth daily.   prochlorperazine 10 MG tablet Commonly known as:  COMPAZINE Take 10 mg by mouth every 6 (six) hours as needed for nausea or vomiting.   Vitamin D 2000 units tablet Take 2,000 Units by mouth daily.       Allergies:  Allergies  Allergen Reactions  . Milk-Related Compounds Anaphylaxis    Throat closes     Past Medical History, Surgical history, Social history, and Family History were reviewed and updated.  Review of Systems: All other 10 point review of systems is negative.   Physical Exam:  vitals were not taken for this visit.   Wt Readings from Last 3 Encounters:  09/19/17 97 lb (44 kg)  09/14/17 96 lb 6.4 oz (43.7 kg)  09/13/17 95 lb (43.1 kg)    Ocular: Sclerae unicteric, pupils equal, round and reactive to light Ear-nose-throat: Oropharynx clear, dentition  fair Lymphatic: No cervical, supraclavicular or axillary adenopathy Lungs no rales or rhonchi, good excursion bilaterally Heart regular rate and rhythm, no murmur appreciated Abd soft, nontender, positive bowel sounds, no liver or spleen tip palpated on exam, no fluid wave  MSK no focal spinal tenderness, no joint edema Neuro: non-focal, well-oriented, appropriate affect Breasts: Deferred   Lab Results  Component Value Date   WBC 2.7 (L) 09/19/2017   HGB 7.3 (L) 09/19/2017   HCT 22.7 (L) 09/19/2017   MCV 95.4 09/19/2017   PLT 34 (L) 09/19/2017   Lab Results  Component Value Date   FERRITIN 1,608  (H) 08/23/2017   IRON 98 08/23/2017   TIBC 210 (L) 08/23/2017   UIBC 112 08/23/2017   IRONPCTSAT 47 08/23/2017   Lab Results  Component Value Date   RBC 2.38 (L) 09/19/2017   Lab Results  Component Value Date   KPAFRELGTCHN 7,992.7 (H) 09/07/2017   LAMBDASER <1.5 (L) 09/07/2017   KAPLAMBRATIO See below. 09/07/2017   Lab Results  Component Value Date   IGGSERUM 120 (L) 09/07/2017   IGA <5 (L) 09/07/2017   IGMSERUM <5 (L) 09/07/2017   Lab Results  Component Value Date   TOTALPROTELP 5.6 (L) 08/23/2017   ALBUMINELP 3.3 08/23/2017   A1GS 0.3 08/23/2017   A2GS 1.0 08/23/2017   BETS 0.8 08/23/2017   GAMS 0.2 (L) 08/23/2017   MSPIKE 0.1 (H) 08/23/2017     Chemistry      Component Value Date/Time   NA 141 09/19/2017 1127   NA 148 (H) 03/17/2017 1210   K 3.4 (L) 09/19/2017 1127   K 4.4 03/17/2017 1210   CL 111 09/19/2017 1127   CL 106 03/17/2017 1210   CO2 20 (L) 09/19/2017 1127   CO2 27 03/17/2017 1210   BUN 56 (H) 09/19/2017 1127   BUN 23 (H) 03/17/2017 1210   CREATININE 2.25 (H) 09/19/2017 1127   CREATININE 2.70 (H) 09/18/2017 1347   CREATININE 1.1 03/17/2017 1210      Component Value Date/Time   CALCIUM 8.8 (L) 09/19/2017 1127   CALCIUM 9.1 03/17/2017 1210   ALKPHOS 61 09/18/2017 1347   ALKPHOS 52 03/17/2017 1210   AST 22 09/18/2017 1347   ALT 19 09/18/2017 1347   ALT 21 03/17/2017 1210   BILITOT 0.7 09/18/2017 1347      Impression and Plan: Jeanne Martinez is a very pleasant 67 yo caucasian female with IgA kappa myeloma with 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 She is tolerating treatment well and was able to have her port a cath placed earlier this week without any complications.  Hgb today is 5.6, platelets 27. She has no complaints at this time, no s/s of distress.  We will give her 2 units of blood tomorrow and plan to see her back again next week.  We will proceed with treatment today as planned per Dr. Marin Olp.  Myeloma studies for today  are pending.  They will contact our office with any questions or concerns. We can certainly see her sooner if need be.   Laverna Peace, NP 6/27/20198:48 AM

## 2017-09-21 NOTE — Progress Notes (Signed)
OK to treat today with critical labs per Dr Marin Olp. Pt to receive 2 units PRBC tomorrow. dph

## 2017-09-21 NOTE — Patient Instructions (Signed)
Daratumumab injection What is this medicine? DARATUMUMAB (dar a toom ue mab) is a monoclonal antibody. It is used to treat multiple myeloma. This medicine may be used for other purposes; ask your health care provider or pharmacist if you have questions. COMMON BRAND NAME(S): DARZALEX What should I tell my health care provider before I take this medicine? They need to know if you have any of these conditions: -infection (especially a virus infection such as chickenpox, cold sores, or herpes) -lung or breathing disease -pregnant or trying to get pregnant -breast-feeding -an unusual or allergic reaction to daratumumab, other medicines, foods, dyes, or preservatives How should I use this medicine? This medicine is for infusion into a vein. It is given by a health care professional in a hospital or clinic setting. Talk to your pediatrician regarding the use of this medicine in children. Special care may be needed. Overdosage: If you think you have taken too much of this medicine contact a poison control center or emergency room at once. NOTE: This medicine is only for you. Do not share this medicine with others. What if I miss a dose? Keep appointments for follow-up doses as directed. It is important not to miss your dose. Call your doctor or health care professional if you are unable to keep an appointment. What may interact with this medicine? Interactions have not been studied. Give your health care provider a list of all the medicines, herbs, non-prescription drugs, or dietary supplements you use. Also tell them if you smoke, drink alcohol, or use illegal drugs. Some items may interact with your medicine. This list may not describe all possible interactions. Give your health care provider a list of all the medicines, herbs, non-prescription drugs, or dietary supplements you use. Also tell them if you smoke, drink alcohol, or use illegal drugs. Some items may interact with your medicine. What  should I watch for while using this medicine? This drug may make you feel generally unwell. Report any side effects. Continue your course of treatment even though you feel ill unless your doctor tells you to stop. This medicine can cause serious allergic reactions. To reduce your risk you may need to take medicine before treatment with this medicine. Take your medicine as directed. This medicine can affect the results of blood tests to match your blood type. These changes can last for up to 6 months after the final dose. Your healthcare provider will do blood tests to match your blood type before you start treatment. Tell all of your healthcare providers that you are being treated with this medicine before receiving a blood transfusion. This medicine can affect the results of some tests used to determine treatment response; extra tests may be needed to evaluate response. Do not become pregnant while taking this medicine or for 3 months after stopping it. Women should inform their doctor if they wish to become pregnant or think they might be pregnant. There is a potential for serious side effects to an unborn child. Talk to your health care professional or pharmacist for more information. What side effects may I notice from receiving this medicine? Side effects that you should report to your doctor or health care professional as soon as possible: -allergic reactions like skin rash, itching or hives, swelling of the face, lips, or tongue -breathing problems -chills -cough -dizziness -feeling faint or lightheaded -headache -low blood counts - this medicine may decrease the number of white blood cells, red blood cells and platelets. You may be at increased risk  for infections and bleeding. -nausea, vomiting -shortness of breath -signs of decreased platelets or bleeding - bruising, pinpoint red spots on the skin, black, tarry stools, blood in the urine -signs of decreased red blood cells - unusually  weak or tired, feeling faint or lightheaded, falls -signs of infection - fever or chills, cough, sore throat, pain or difficulty passing urine Side effects that usually do not require medical attention (report to your doctor or health care professional if they continue or are bothersome): -back pain -diarrhea -muscle cramps -pain, tingling, numbness in the hands or feet -swelling of the ankles, feet, hands -tiredness This list may not describe all possible side effects. Call your doctor for medical advice about side effects. You may report side effects to FDA at 1-800-FDA-1088. Where should I keep my medicine? Keep out of the reach of children. This drug is given in a hospital or clinic and will not be stored at home. NOTE: This sheet is a summary. It may not cover all possible information. If you have questions about this medicine, talk to your doctor, pharmacist, or health care provider.  2018 Elsevier/Gold Standard (2015-04-16 10:38:11)  

## 2017-09-21 NOTE — Telephone Encounter (Signed)
Critical Value Hgb 5.6 Dr Marin Olp notified. Orders placed.

## 2017-09-22 ENCOUNTER — Other Ambulatory Visit: Payer: Self-pay

## 2017-09-22 ENCOUNTER — Inpatient Hospital Stay: Payer: Medicare Other

## 2017-09-22 DIAGNOSIS — D649 Anemia, unspecified: Secondary | ICD-10-CM

## 2017-09-22 DIAGNOSIS — C9000 Multiple myeloma not having achieved remission: Secondary | ICD-10-CM

## 2017-09-22 DIAGNOSIS — Z5112 Encounter for antineoplastic immunotherapy: Secondary | ICD-10-CM | POA: Diagnosis not present

## 2017-09-22 LAB — IGG, IGA, IGM
IGG (IMMUNOGLOBIN G), SERUM: 186 mg/dL — AB (ref 700–1600)
IgA: 28 mg/dL — ABNORMAL LOW (ref 87–352)
IgM (Immunoglobulin M), Srm: 6 mg/dL — ABNORMAL LOW (ref 26–217)

## 2017-09-22 LAB — KAPPA/LAMBDA LIGHT CHAINS
Kappa free light chain: 8670.4 mg/L — ABNORMAL HIGH (ref 3.3–19.4)
Kappa, lambda light chain ratio: 5100.24 — ABNORMAL HIGH (ref 0.26–1.65)
Lambda free light chains: 1.7 mg/L — ABNORMAL LOW (ref 5.7–26.3)

## 2017-09-22 MED ORDER — HEPARIN SOD (PORK) LOCK FLUSH 100 UNIT/ML IV SOLN
250.0000 [IU] | INTRAVENOUS | Status: DC | PRN
  Filled 2017-09-22: qty 5

## 2017-09-22 MED ORDER — FUROSEMIDE 10 MG/ML IJ SOLN: 20.0000 mg | Freq: Once | INTRAMUSCULAR | Status: DC

## 2017-09-22 MED ORDER — ACETAMINOPHEN 325 MG PO TABS
ORAL_TABLET | ORAL | Status: AC
  Filled 2017-09-22: qty 2

## 2017-09-22 MED ORDER — HEPARIN SOD (PORK) LOCK FLUSH 100 UNIT/ML IV SOLN
500.0000 [IU] | Freq: Every day | INTRAVENOUS | Status: AC | PRN
  Administered 2017-09-22: 500 [IU]
  Filled 2017-09-22: qty 5

## 2017-09-22 MED ORDER — SODIUM CHLORIDE 0.9% FLUSH
10.0000 mL | INTRAVENOUS | Status: AC | PRN
  Administered 2017-09-22: 10 mL
  Filled 2017-09-22: qty 10

## 2017-09-22 MED ORDER — DIPHENHYDRAMINE HCL 25 MG PO CAPS
25.0000 mg | ORAL_CAPSULE | Freq: Once | ORAL | Status: AC
  Administered 2017-09-22: 25 mg via ORAL

## 2017-09-22 MED ORDER — SODIUM CHLORIDE 0.9 % IV SOLN
250.0000 mL | Freq: Once | INTRAVENOUS | Status: AC
  Administered 2017-09-22: 250 mL via INTRAVENOUS

## 2017-09-22 MED ORDER — ACETAMINOPHEN 325 MG PO TABS
650.0000 mg | ORAL_TABLET | Freq: Once | ORAL | Status: AC
  Administered 2017-09-22: 650 mg via ORAL

## 2017-09-22 MED ORDER — DIPHENHYDRAMINE HCL 25 MG PO CAPS
ORAL_CAPSULE | ORAL | Status: AC
  Filled 2017-09-22: qty 1

## 2017-09-22 NOTE — Patient Instructions (Signed)

## 2017-09-23 LAB — TYPE AND SCREEN
ABO/RH(D): A POS
Antibody Screen: POSITIVE
UNIT DIVISION: 0
UNIT DIVISION: 0

## 2017-09-23 LAB — BPAM RBC
BLOOD PRODUCT EXPIRATION DATE: 201908012359
Blood Product Expiration Date: 201908032359
ISSUE DATE / TIME: 201906280747
ISSUE DATE / TIME: 201906280747
UNIT TYPE AND RH: 5100
Unit Type and Rh: 5100

## 2017-09-25 LAB — PROTEIN ELECTROPHORESIS, SERUM, WITH REFLEX
A/G Ratio: 1.3 (ref 0.7–1.7)
ALPHA-1-GLOBULIN: 0.3 g/dL (ref 0.0–0.4)
Albumin ELP: 2.8 g/dL — ABNORMAL LOW (ref 2.9–4.4)
Alpha-2-Globulin: 0.8 g/dL (ref 0.4–1.0)
Beta Globulin: 0.7 g/dL (ref 0.7–1.3)
GAMMA GLOBULIN: 0.3 g/dL — AB (ref 0.4–1.8)
GLOBULIN, TOTAL: 2.1 g/dL — AB (ref 2.2–3.9)
M-SPIKE, %: 0.1 g/dL — AB
SPEP Interpretation: 0
TOTAL PROTEIN ELP: 4.9 g/dL — AB (ref 6.0–8.5)

## 2017-09-25 LAB — IMMUNOFIXATION REFLEX, SERUM
IGA: 26 mg/dL — AB (ref 87–352)
IgG (Immunoglobin G), Serum: 180 mg/dL — ABNORMAL LOW (ref 700–1600)
IgM (Immunoglobulin M), Srm: 6 mg/dL — ABNORMAL LOW (ref 26–217)

## 2017-09-29 ENCOUNTER — Other Ambulatory Visit: Payer: Self-pay

## 2017-09-29 ENCOUNTER — Inpatient Hospital Stay: Payer: Medicare Other | Attending: Hematology & Oncology

## 2017-09-29 ENCOUNTER — Inpatient Hospital Stay: Payer: Medicare Other

## 2017-09-29 ENCOUNTER — Encounter: Payer: Self-pay | Admitting: Family

## 2017-09-29 ENCOUNTER — Telehealth: Payer: Self-pay

## 2017-09-29 ENCOUNTER — Encounter: Payer: Self-pay | Admitting: Hematology & Oncology

## 2017-09-29 ENCOUNTER — Inpatient Hospital Stay (HOSPITAL_BASED_OUTPATIENT_CLINIC_OR_DEPARTMENT_OTHER): Payer: Medicare Other | Admitting: Family

## 2017-09-29 VITALS — BP 90/53 | HR 107 | Temp 98.3°F | Resp 18 | Wt 95.0 lb

## 2017-09-29 DIAGNOSIS — C9002 Multiple myeloma in relapse: Secondary | ICD-10-CM | POA: Insufficient documentation

## 2017-09-29 DIAGNOSIS — C9000 Multiple myeloma not having achieved remission: Secondary | ICD-10-CM

## 2017-09-29 DIAGNOSIS — D696 Thrombocytopenia, unspecified: Secondary | ICD-10-CM

## 2017-09-29 DIAGNOSIS — Z5189 Encounter for other specified aftercare: Secondary | ICD-10-CM | POA: Insufficient documentation

## 2017-09-29 DIAGNOSIS — N179 Acute kidney failure, unspecified: Secondary | ICD-10-CM

## 2017-09-29 DIAGNOSIS — D649 Anemia, unspecified: Secondary | ICD-10-CM | POA: Diagnosis not present

## 2017-09-29 LAB — CMP (CANCER CENTER ONLY)
ALT: 22 U/L (ref 10–47)
ANION GAP: 10 (ref 5–15)
AST: 24 U/L (ref 11–38)
Albumin: 2.8 g/dL — ABNORMAL LOW (ref 3.5–5.0)
Alkaline Phosphatase: 54 U/L (ref 26–84)
BUN: 41 mg/dL — ABNORMAL HIGH (ref 7–22)
CO2: 22 mmol/L (ref 18–33)
Calcium: 8.6 mg/dL (ref 8.0–10.3)
Chloride: 105 mmol/L (ref 98–108)
Creatinine: 2.8 mg/dL — ABNORMAL HIGH (ref 0.60–1.20)
Glucose, Bld: 169 mg/dL — ABNORMAL HIGH (ref 73–118)
POTASSIUM: 4 mmol/L (ref 3.3–4.7)
SODIUM: 137 mmol/L (ref 128–145)
Total Bilirubin: 0.6 mg/dL (ref 0.2–1.6)
Total Protein: 5.6 g/dL — ABNORMAL LOW (ref 6.4–8.1)

## 2017-09-29 LAB — CBC WITH DIFFERENTIAL (CANCER CENTER ONLY)
BASOS ABS: 0 10*3/uL (ref 0.0–0.1)
BASOS PCT: 0 %
EOS ABS: 0 10*3/uL (ref 0.0–0.5)
EOS PCT: 1 %
HCT: 25.3 % — ABNORMAL LOW (ref 34.8–46.6)
Hemoglobin: 8.4 g/dL — ABNORMAL LOW (ref 11.6–15.9)
Lymphocytes Relative: 26 %
Lymphs Abs: 0.5 10*3/uL — ABNORMAL LOW (ref 0.9–3.3)
MCH: 31 pg (ref 26.0–34.0)
MCHC: 33.2 g/dL (ref 32.0–36.0)
MCV: 93.4 fL (ref 81.0–101.0)
MONO ABS: 0.1 10*3/uL (ref 0.1–0.9)
Monocytes Relative: 5 %
Neutro Abs: 1.2 10*3/uL — ABNORMAL LOW (ref 1.5–6.5)
Neutrophils Relative %: 68 %
PLATELETS: 5 10*3/uL — AB (ref 145–400)
RBC: 2.71 MIL/uL — AB (ref 3.70–5.32)
RDW: 17 % — ABNORMAL HIGH (ref 11.1–15.7)
WBC: 1.8 10*3/uL — AB (ref 3.9–10.0)

## 2017-09-29 LAB — SAMPLE TO BLOOD BANK

## 2017-09-29 MED ORDER — SODIUM CHLORIDE 0.9% FLUSH
10.0000 mL | INTRAVENOUS | Status: AC | PRN
  Administered 2017-09-29: 10 mL
  Filled 2017-09-29: qty 10

## 2017-09-29 MED ORDER — ACETAMINOPHEN 325 MG PO TABS
ORAL_TABLET | ORAL | Status: AC
  Filled 2017-09-29: qty 2

## 2017-09-29 MED ORDER — DIPHENHYDRAMINE HCL 25 MG PO CAPS
25.0000 mg | ORAL_CAPSULE | Freq: Once | ORAL | Status: AC
  Administered 2017-09-29: 25 mg via ORAL

## 2017-09-29 MED ORDER — SODIUM CHLORIDE 0.9 % IV SOLN
250.0000 mL | Freq: Once | INTRAVENOUS | Status: AC
  Administered 2017-09-29: 250 mL via INTRAVENOUS

## 2017-09-29 MED ORDER — DIPHENHYDRAMINE HCL 25 MG PO CAPS
ORAL_CAPSULE | ORAL | Status: AC
  Filled 2017-09-29: qty 1

## 2017-09-29 MED ORDER — HEPARIN SOD (PORK) LOCK FLUSH 100 UNIT/ML IV SOLN
500.0000 [IU] | Freq: Every day | INTRAVENOUS | Status: AC | PRN
  Administered 2017-09-29: 500 [IU]
  Filled 2017-09-29: qty 5

## 2017-09-29 MED ORDER — ACETAMINOPHEN 325 MG PO TABS
650.0000 mg | ORAL_TABLET | Freq: Once | ORAL | Status: AC
  Administered 2017-09-29: 650 mg via ORAL

## 2017-09-29 MED ORDER — SODIUM CHLORIDE 0.9 % IJ SOLN
10.0000 mL | Freq: Once | INTRAMUSCULAR | Status: AC
  Administered 2017-09-29: 10 mL
  Filled 2017-09-29: qty 10

## 2017-09-29 NOTE — Telephone Encounter (Signed)
Sarah NP aware of critical low platelets & will address in office visit. dph

## 2017-09-29 NOTE — Progress Notes (Signed)
Hematology and Oncology Follow Up Visit  Jeanne Martinez [id number] 12/24/1950 67 y.o. 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 Jeanne platelet count is now 5, Hgb improved at 8.4 after 2 units of blood given last week and WBC count is 1.8.  Thankfully she has had no episodes of bleeding and has no bruising or petechiae at this time.  She has had no issue so far with infection. No fever, chills, n/v, cough, rash, dizziness, SOB, chest pain, palpitations, abdominal pain or changes in bowel or bladder habits.  She has chronic puffiness in Jeanne left foot. This is unchanged. Pedal pulses are +2. No c/o of tenderness, numbness or tingling in Jeanne extremities.  She states that she still has lots of energy. She has maintained a good appetite and is doing Jeanne best to stay hydrated. Jeanne weight is stable.   ECOG Performance Status: 2 - Symptomatic, <50% confined to bed  Medications:  Allergies as of 09/29/2017      Reactions   Milk-related Compounds Anaphylaxis   Throat closes       Medication List        Accurate as of 09/29/17 11:38 AM. Always use your most recent med list.          aspirin EC 81 MG tablet Take 81 mg by mouth daily.   dexamethasone 4 MG tablet Commonly known as:  DECADRON TAKE 3 TABLETS ONCE WEEKLY WITH FOOD   famciclovir 250 MG tablet Commonly known as:  FAMVIR Take 1 tablet (250 mg total) by mouth daily.   lidocaine-prilocaine cream Commonly known as:  EMLA Apply 1 application  topically as needed. Apply to port one hour before appointment   LORazepam 0.5 MG tablet Commonly known as:  ATIVAN Take 0.5 mg by mouth every 6 (six) hours as needed (NAUSEA AND VOMITING).   magnesium oxide 400 MG tablet Commonly known as:  MAG-OX Take 400 mg by mouth daily.   montelukast 10 MG tablet Commonly known as:  SINGULAIR TAKE 1 TABLET BY MOUTH EVERYDAY AT BEDTIME   multivitamin tablet Take 1 tablet by mouth daily.   prochlorperazine 10 MG tablet Commonly known as:  COMPAZINE Take 10 mg by mouth every 6 (six) hours as needed for nausea or vomiting.   Vitamin D 2000 units tablet Take 2,000 Units by mouth daily.       Allergies:  Allergies  Allergen Reactions  . Milk-Related Compounds Anaphylaxis    Throat closes     Past Medical History, Surgical history, Social history, and Family History were reviewed and updated.  Review of Systems: All other 10 point review of systems is negative.   Physical Exam:  weight is 95 lb (43.1 kg). Jeanne oral temperature is 98.3 F (36.8 C). Jeanne blood pressure is 90/53 (abnormal) and Jeanne pulse is 107 (abnormal). Jeanne respiration is 18 and oxygen saturation is 100%.   Wt Readings from Last 3 Encounters:  09/29/17 95 lb (43.1 kg)  09/21/17 96 lb (43.5 kg)  09/19/17 97 lb (44 kg)  Ocular: Sclerae unicteric, pupils equal, round and reactive to light Ear-nose-throat: Oropharynx clear, dentition fair Lymphatic: No cervical, supraclavicular or axillary adenopathy Lungs no rales or rhonchi, good excursion bilaterally Heart regular rate and rhythm, no murmur appreciated Abd soft, nontender, positive bowel sounds, no liver or spleen tip palpated on exam, no fluid wave  MSK no focal spinal tenderness, no joint edema Neuro: non-focal, well-oriented, appropriate affect Breasts: Deferred   Lab Results  Component Value Date   WBC 1.8 (L) 09/29/2017   HGB 8.4 (L) 09/29/2017   HCT 25.3 (L) 09/29/2017   MCV 93.4 09/29/2017   PLT 5  (LL) 09/29/2017   Lab Results  Component Value Date   FERRITIN 1,608 (H) 08/23/2017   IRON 98 08/23/2017   TIBC 210 (L) 08/23/2017   UIBC 112 08/23/2017   IRONPCTSAT 47 08/23/2017   Lab Results  Component Value Date   RBC 2.71 (L) 09/29/2017   Lab Results  Component Value Date   KPAFRELGTCHN 8,670.4 (H) 09/21/2017   LAMBDASER 1.7 (L) 09/21/2017   KAPLAMBRATIO 5,100.24 (H) 09/21/2017   Lab Results  Component Value Date   IGGSERUM 180 (L) 09/21/2017   IGA 26 (L) 09/21/2017   IGMSERUM 6 (L) 09/21/2017   Lab Results  Component Value Date   TOTALPROTELP 4.9 (L) 09/21/2017   ALBUMINELP 2.8 (L) 09/21/2017   A1GS 0.3 09/21/2017   A2GS 0.8 09/21/2017   BETS 0.7 09/21/2017   GAMS 0.3 (L) 09/21/2017   MSPIKE 0.1 (H) 09/21/2017     Chemistry      Component Value Date/Time   NA 137 09/29/2017 0846   NA 148 (H) 03/17/2017 1210   K 4.0 09/29/2017 0846   K 4.4 03/17/2017 1210   CL 105 09/29/2017 0846   CL 106 03/17/2017 1210   CO2 22 09/29/2017 0846   CO2 27 03/17/2017 1210   BUN 41 (H) 09/29/2017 0846   BUN 23 (H) 03/17/2017 1210   CREATININE 2.80 (H) 09/29/2017 0846   CREATININE 1.1 03/17/2017 1210      Component Value Date/Time   CALCIUM 8.6 09/29/2017 0846   CALCIUM 9.1 03/17/2017 1210   ALKPHOS 54 09/29/2017 0846   ALKPHOS 52 03/17/2017 1210   AST 24 09/29/2017 0846   ALT 22 09/29/2017 0846   ALT 21 03/17/2017 1210   BILITOT 0.6 09/29/2017 0846      Impression and Plan: Jeanne Martinez is a very pleasant 67 yo caucasian female with IgA kappa myeloma with 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  I spoke with Dr. Burr Medico today about Jeanne lab work and we will hold Dazalex and give Jeanne platelets today.  We had wanted to bring Jeanne back in on Monday for re-evaluation but they state that this is not possible as Jeanne Martinez has a lab appointment with his oncologist on Monday in [place], [place], with doctors appointment and treatment on Wednesday. They plan to leave  tomorrow. He states he "was just not given enough notice" and he would not be able to re arrange his appointments. They verbalized understanding of the risks she is under travelling with Jeanne current lab work. If they can make it back sooner they will let us know.  For now, they will plan to keep Jeanne appointments on 7/12.  They will contact our office with any questions or concerns and go to the ED in the event of an emergency.   Laverna Peace, NP 7/5/201911:38 AM

## 2017-09-29 NOTE — Patient Instructions (Addendum)
Platelet Transfusion A platelet transfusion is a procedure in which you receive donated platelets through an IV tube. Platelets are tiny pieces of blood cells. When a blood vessel is damaged, platelets collect in the damaged area to help form a blood clot. This begins the healing process. If your platelet count gets too low, your blood may have trouble clotting. You may need a platelet transfusion if you have a condition that causes a low number of platelets (thrombocytopenia). A platelet transfusion may be used to stop or prevent bleeding. Tell a health care provider about:  Any allergies you have.  All medicines you are taking, including vitamins, herbs, eye drops, creams, and over-the-counter medicines.  Any problems you or family members have had with anesthetic medicines.  Any blood disorders you have.  Any surgeries you have had.  Any medical conditions you have.  Any reactions you have had during a previous transfusion. What are the risks? Generally, this is a safe procedure. However, problems may occur, including:  Fever with or without chills. The fever usually occurs within the first 4 hours of the transfusion and returns to normal within 48 hours.  Allergic reaction. The reaction is most commonly caused by antibodies your body creates against substances in the transfusion. Signs of an allergic reaction may include itching, hives, difficulty breathing, shock, or low blood pressure.  Sudden (acute) or delayed hemolytic reaction. This rare reaction can occur during the transfusion and up to 28 days after the transfusion. The reaction usually occurs when your body's defense system (immune system) attacks the new platelets. Signs of a hemolytic reaction may include fever, headache, difficulty breathing, low blood pressure, a rapid heartbeat, or pain in your back, abdomen, chest, or IV site.  Transfusion-related acute lung injury (TRALI). TRALI can occur within hours of a  transfusion, or several days later. This is a rare reaction that causes lung damage. The cause is not known.  Infection. Signs of this rare complication may include fever, chills, vomiting, a rapid heartbeat, or low blood pressure.  What happens before the procedure?  You may have a blood test to determine your blood type. This is necessary to find out what kind ofplatelets best matches your platelets.  If you have had an allergic reaction to a transfusion in the past, you may be given medicine to help prevent a reaction. Take this medicine only as directed by your health care provider.  Your temperature, blood pressure, and pulse will be monitored before the transfusion. What happens during the procedure?  An IV will be started in your hand or arm.  The transfusion will be attached to your IV tubing. The bag of donated platelets will be attached to your IV tube andgiven into your vein.  Your temperature, blood pressure, and pulse will be monitored regularly during the transfusion. This monitoring is done to help detect early signs of a transfusion reaction.  If you have any signs or symptoms of a reaction, your transfusion will be stopped and you may be given medicine.  When your transfusion is complete, your IV will be removed.  Pressure may be applied to the IV site for a few minutes.  A bandage (dressing) will be applied. The procedure may vary among health care providers and hospitals. What happens after the procedure?  Your blood pressure, temperature, and pulse will be monitored regularly. This information is not intended to replace advice given to you by your health care provider. Make sure you discuss any questions you  have with your health care provider. Document Released: 01/09/2007 Document Revised: 08/20/2015 Document Reviewed: 01/22/2014 Elsevier Interactive Patient Education  2018 [employer].    Thrombocytopenia Thrombocytopenia means that you have a low  number of platelets in your blood. Platelets are tiny cells in the blood. When you bleed, they clump together at the cut or injury to stop the bleeding. This is called blood clotting. Not having enough platelets can cause bleeding problems. Follow these instructions at home: General instructions  Check your skin and inside your mouth for bruises or blood as told by your doctor.  Check to see if there is blood in your spit (sputum), pee (urine), and poop (stool). Do this as told by your doctor.  Ask your doctor if you can drink alcohol.  Take over-the-counter and prescription medicines only as told by your doctor.  Tell all of your doctors that you have this condition. Be sure to tell your dentist and eye doctor too. Activity  Do not do activities that can cause bumps or bruises until your doctor says it is okay.  Be careful not to cut yourself: ? When you shave. ? When you use scissors, needles, knives, or other tools.  Be careful not to burn yourself: ? When you use an iron. ? When you cook. Contact a doctor if:  You have bruises and you do not know why. Get help right away if:  You are bleeding anywhere on your body.  You have blood in your spit, pee, or poop. This information is not intended to replace advice given to you by your health care provider. Make sure you discuss any questions you have with your health care provider. Document Released: 03/03/2011 Document Revised: 11/15/2015 Document Reviewed: 09/15/2014 Elsevier Interactive Patient Education  [employer].

## 2017-09-29 NOTE — Patient Instructions (Signed)
Implanted Port Home Guide An implanted port is a type of central line that is placed under the skin. Central lines are used to provide IV access when treatment or nutrition needs to be given through a person's veins. Implanted ports are used for long-term IV access. An implanted port may be placed because:  You need IV medicine that would be irritating to the small veins in your hands or arms.  You need long-term IV medicines, such as antibiotics.  You need IV nutrition for a long period.  You need frequent blood draws for lab tests.  You need dialysis.  Implanted ports are usually placed in the chest area, but they can also be placed in the upper arm, the abdomen, or the leg. An implanted port has two main parts:  Reservoir. The reservoir is round and will appear as a small, raised area under your skin. The reservoir is the part where a needle is inserted to give medicines or draw blood.  Catheter. The catheter is a thin, flexible tube that extends from the reservoir. The catheter is placed into a large vein. Medicine that is inserted into the reservoir goes into the catheter and then into the vein.  How will I care for my incision site? Do not get the incision site wet. Bathe or shower as directed by your health care provider. How is my port accessed? Special steps must be taken to access the port:  Before the port is accessed, a numbing cream can be placed on the skin. This helps numb the skin over the port site.  Your health care provider uses a sterile technique to access the port. ? Your health care provider must put on a mask and sterile gloves. ? The skin over your port is cleaned carefully with an antiseptic and allowed to dry. ? The port is gently pinched between sterile gloves, and a needle is inserted into the port.  Only "non-coring" port needles should be used to access the port. Once the port is accessed, a blood return should be checked. This helps ensure that the port  is in the vein and is not clogged.  If your port needs to remain accessed for a constant infusion, a clear (transparent) bandage will be placed over the needle site. The bandage and needle will need to be changed every week, or as directed by your health care provider.  Keep the bandage covering the needle clean and dry. Do not get it wet. Follow your health care provider's instructions on how to take a shower or bath while the port is accessed.  If your port does not need to stay accessed, no bandage is needed over the port.  What is flushing? Flushing helps keep the port from getting clogged. Follow your health care provider's instructions on how and when to flush the port. Ports are usually flushed with saline solution or a medicine called heparin. The need for flushing will depend on how the port is used.  If the port is used for intermittent medicines or blood draws, the port will need to be flushed: ? After medicines have been given. ? After blood has been drawn. ? As part of routine maintenance.  If a constant infusion is running, the port may not need to be flushed.  How long will my port stay implanted? The port can stay in for as long as your health care provider thinks it is needed. When it is time for the port to come out, surgery will be   done to remove it. The procedure is similar to the one performed when the port was put in. When should I seek immediate medical care? When you have an implanted port, you should seek immediate medical care if:  You notice a bad smell coming from the incision site.  You have swelling, redness, or drainage at the incision site.  You have more swelling or pain at the port site or the surrounding area.  You have a fever that is not controlled with medicine.  This information is not intended to replace advice given to you by your health care provider. Make sure you discuss any questions you have with your health care provider. Document  Released: 03/14/2005 Document Revised: 08/20/2015 Document Reviewed: 11/19/2012 Elsevier Interactive Patient Education  2017 Elsevier Inc.  

## 2017-10-01 LAB — PREPARE PLATELET PHERESIS: UNIT DIVISION: 0

## 2017-10-01 LAB — BPAM PLATELET PHERESIS
Blood Product Expiration Date: 201907062359
ISSUE DATE / TIME: 201907051045
Unit Type and Rh: 6200

## 2017-10-02 ENCOUNTER — Other Ambulatory Visit: Payer: Self-pay | Admitting: Hematology & Oncology

## 2017-10-02 DIAGNOSIS — C9000 Multiple myeloma not having achieved remission: Secondary | ICD-10-CM

## 2017-10-04 ENCOUNTER — Other Ambulatory Visit: Payer: Self-pay | Admitting: Family

## 2017-10-04 DIAGNOSIS — C9000 Multiple myeloma not having achieved remission: Secondary | ICD-10-CM

## 2017-10-06 ENCOUNTER — Inpatient Hospital Stay: Payer: Medicare Other

## 2017-10-06 ENCOUNTER — Other Ambulatory Visit: Payer: Self-pay | Admitting: *Deleted

## 2017-10-06 ENCOUNTER — Encounter: Payer: Self-pay | Admitting: Hematology & Oncology

## 2017-10-06 ENCOUNTER — Telehealth: Payer: Self-pay | Admitting: *Deleted

## 2017-10-06 ENCOUNTER — Inpatient Hospital Stay (HOSPITAL_BASED_OUTPATIENT_CLINIC_OR_DEPARTMENT_OTHER): Payer: Medicare Other | Admitting: Hematology & Oncology

## 2017-10-06 ENCOUNTER — Other Ambulatory Visit: Payer: Self-pay

## 2017-10-06 ENCOUNTER — Telehealth: Payer: Self-pay | Admitting: Pharmacist

## 2017-10-06 VITALS — BP 97/55 | HR 95 | Temp 97.9°F | Resp 20 | Wt 97.4 lb

## 2017-10-06 VITALS — BP 99/54 | HR 100 | Temp 98.2°F | Resp 17

## 2017-10-06 DIAGNOSIS — C9002 Multiple myeloma in relapse: Secondary | ICD-10-CM

## 2017-10-06 DIAGNOSIS — N179 Acute kidney failure, unspecified: Secondary | ICD-10-CM

## 2017-10-06 DIAGNOSIS — C9000 Multiple myeloma not having achieved remission: Secondary | ICD-10-CM

## 2017-10-06 DIAGNOSIS — D696 Thrombocytopenia, unspecified: Secondary | ICD-10-CM

## 2017-10-06 LAB — CBC WITH DIFFERENTIAL (CANCER CENTER ONLY)
BASOS ABS: 0 10*3/uL (ref 0.0–0.1)
Basophils Relative: 0 %
Eosinophils Absolute: 0 10*3/uL (ref 0.0–0.5)
Eosinophils Relative: 2 %
HEMATOCRIT: 18.1 % — AB (ref 34.8–46.6)
Hemoglobin: 6 g/dL — CL (ref 11.6–15.9)
LYMPHS ABS: 0.4 10*3/uL — AB (ref 0.9–3.3)
LYMPHS PCT: 31 %
MCH: 31.3 pg (ref 26.0–34.0)
MCHC: 33.1 g/dL (ref 32.0–36.0)
MCV: 94.3 fL (ref 81.0–101.0)
MONO ABS: 0.1 10*3/uL (ref 0.1–0.9)
MONOS PCT: 10 %
NEUTROS ABS: 0.8 10*3/uL — AB (ref 1.5–6.5)
Neutrophils Relative %: 57 %
PLATELETS: 6 10*3/uL — AB (ref 145–400)
RBC: 1.92 MIL/uL — ABNORMAL LOW (ref 3.70–5.32)
RDW: 16.5 % — AB (ref 11.1–15.7)
WBC Count: 1.4 10*3/uL — ABNORMAL LOW (ref 3.9–10.0)

## 2017-10-06 LAB — CMP (CANCER CENTER ONLY)
ALT: 22 U/L (ref 10–47)
ANION GAP: 12 (ref 5–15)
AST: 21 U/L (ref 11–38)
Albumin: 2.5 g/dL — ABNORMAL LOW (ref 3.5–5.0)
Alkaline Phosphatase: 68 U/L (ref 26–84)
BILIRUBIN TOTAL: 0.5 mg/dL (ref 0.2–1.6)
BUN: 90 mg/dL — ABNORMAL HIGH (ref 7–22)
CALCIUM: 9.1 mg/dL (ref 8.0–10.3)
CO2: 19 mmol/L (ref 18–33)
Chloride: 105 mmol/L (ref 98–108)
Creatinine: 4.7 mg/dL (ref 0.60–1.20)
Glucose, Bld: 99 mg/dL (ref 73–118)
POTASSIUM: 4.1 mmol/L (ref 3.3–4.7)
Sodium: 136 mmol/L (ref 128–145)
TOTAL PROTEIN: 5.5 g/dL — AB (ref 6.4–8.1)

## 2017-10-06 LAB — PREPARE RBC (CROSSMATCH)

## 2017-10-06 MED ORDER — SODIUM CHLORIDE 0.9 % IV SOLN
INTRAVENOUS | Status: DC
  Administered 2017-10-06: 10:00:00 via INTRAVENOUS

## 2017-10-06 MED ORDER — SODIUM CHLORIDE 0.9 % IV SOLN
20.0000 mg | Freq: Once | INTRAVENOUS | Status: AC
  Administered 2017-10-06: 20 mg via INTRAVENOUS
  Filled 2017-10-06: qty 2

## 2017-10-06 NOTE — Telephone Encounter (Signed)
Critical Value  Hgb 6 Platelets 6 Creatinine 4.7 Dr Marin Olp notified. No orders at this time.

## 2017-10-06 NOTE — Patient Instructions (Addendum)
Platelet Transfusion, Care After °Refer to this sheet in the next few weeks. These instructions provide you with information about caring for yourself after your procedure. Your health care provider may also give you more specific instructions. Your treatment has been planned according to current medical practices, but problems sometimes occur. Call your health care provider if you have any problems or questions after your procedure. °What can I expect after the procedure? °After the procedure, it is common to have: °· Bruising and soreness at the IV site. °· Fever or chills within the first 48 hours of your transfusion. ° °Follow these instructions at home: °· Take medicines only as directed by your health care provider. Ask your health care provider if you can take an over-the-counter pain reliever in case you have a fever or headache a day or two after your transfusion. °· Return to your normal activities as directed by your health care provider. °Contact a health care provider if: °· You have a fever. °· You have a headache. °· You have redness, swelling, or pain at your IV site. °· You have skin itching or a rash. °· You vomit. °· You feel unusually tired or weak. °Get help right away if: °· You have trouble breathing. °· You have a decreased amount of urine or you urinate less often than you normally do. °· Your urine is darker than normal. °· You have pain in your back, abdomen, or chest. °· You have cool, clammy skin. °· You have a rapid heartbeat. °This information is not intended to replace advice given to you by your health care provider. Make sure you discuss any questions you have with your health care provider. °Document Released: 04/04/2014 Document Revised: 08/20/2015 Document Reviewed: 01/22/2014 °Elsevier Interactive Patient Education © 2018 Elsevier Inc. ° °

## 2017-10-06 NOTE — Telephone Encounter (Signed)
Oral Oncology Pharmacist Encounter  Received new prescription for Xpovio (selinexor) for the treatment of refractory multiple myeloma in conjunction with dexamethasone, planned duration until disease progression or unacceptable drug toxicity.  CBC/CMP from 10/06/17 assessed, plt/hgb were low she will receive transfusions today for both. Prescription dose and frequency assessed.   Ms. Pint will need to take a 5-HT3 antagonist as prophylaxis for nausea during her therapy.    Per the PI no dedicated drug interaction studies have been performed with selinexor. In vitro is was found to be a substrate of CYP3A4, inhibits OATP1B3, and a substrate of UGTs and GSTs. At this time selinexor is not available in DDI checker available through [employer] or UpToDate. Current medication list in Epic reviewed manually by reviewing the PK profiles, not DDI identified.   Prescription has been faxed to [facility] due to the current limited distrubution of selinexor.   Oral Oncology Clinic will continue to follow for insurance authorization, copayment issues, initial counseling and start date.  Darl Pikes, PharmD, BCPS, [facility] Hematology/Oncology Clinical Pharmacist [facility] [place] [facility] [phone]  10/06/2017 3:37 PM

## 2017-10-06 NOTE — Progress Notes (Signed)
Hematology and Oncology Follow Up Visit  Jeanne Martinez [id number] 1951-01-14 67 y.o. 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  Unfortunately, her myeloma is progressing.  The progression is fairly quick.  Her renal function is deteriorating.  The renal function is 4.7 with her creatinine and BUN of 90.  She actually still feels quite well.  She is had no complaints.  She and her husband were up in [place].  He has prostate cancer and gets his Lupron up in [place].  She was able to see friends.  She played bridge.  Her kappa light chain is up to 867 mg/dL.  We are clearly going to have to make a change in her protocol.  I will have to try her on bendamustine for right now.  Thankfully, she is still eating okay.  Her weight is holding steady.  She is had no nausea or vomiting.  There is been no cough or shortness of breath.  She is had no chest wall pain.  There is been no leg swelling.  Overall, her performance status is ECOG 2.  Medications:  Allergies as of 10/06/2017      Reactions   Milk-related Compounds Anaphylaxis   Throat closes       Medication List        Accurate as of 10/06/17  2:32 PM. Always use your most recent med list.          aspirin EC 81 MG tablet Take 81 mg by mouth daily.   dexamethasone 4 MG tablet Commonly known as:  DECADRON TAKE 3 TABLETS ONCE WEEKLY WITH FOOD   famciclovir 250 MG tablet Commonly known as:   FAMVIR TAKE 1 TABLET BY MOUTH EVERY DAY   lidocaine-prilocaine cream Commonly known as:  EMLA Apply 1 application topically as needed. Apply to port one hour before appointment   LORazepam 0.5 MG tablet Commonly known as:  ATIVAN Take 0.5 mg by mouth every 6 (six) hours as needed (NAUSEA AND VOMITING).   magnesium oxide 400 MG tablet Commonly known as:  MAG-OX Take 400 mg by mouth daily.   montelukast 10 MG tablet Commonly known as:  SINGULAIR TAKE 1 TABLET BY MOUTH EVERYDAY AT BEDTIME   multivitamin tablet Take 1 tablet by mouth daily.   prochlorperazine 10 MG tablet Commonly known as:  COMPAZINE Take 10 mg by mouth every 6 (six) hours as needed for nausea or vomiting.   Vitamin D 2000 units tablet Take 2,000 Units by mouth daily.       Allergies:  Allergies  Allergen Reactions  . Milk-Related Compounds Anaphylaxis    Throat closes     Past Medical History, Surgical history, Social history, and Family History were reviewed and updated.  Review of Systems: All other 10 point review of systems is negative.   Physical Exam:  weight is 97 lb 6.4 oz (44.2 kg). Her oral temperature is 97.9 F (36.6 C). Her blood pressure is 97/55 (abnormal) and her pulse is 95. Her respiration is  20 and oxygen saturation is 100%.   Wt Readings from Last 3 Encounters:  10/06/17 97 lb 6.4 oz (44.2 kg)  09/29/17 95 lb (43.1 kg)  09/21/17 96 lb (43.5 kg)    Ocular: Sclerae unicteric, pupils equal, round and reactive to light Ear-nose-throat: Oropharynx clear, dentition fair Lymphatic: No cervical, supraclavicular or axillary adenopathy Lungs no rales or rhonchi, good excursion bilaterally Heart regular rate and rhythm, no murmur appreciated Abd soft, nontender, positive bowel sounds, no liver or spleen tip palpated on exam, no fluid wave  MSK no focal spinal tenderness, no joint edema Neuro: non-focal, well-oriented, appropriate affect Breasts: Deferred   Lab Results  Component  Value Date   WBC 1.4 (L) 10/06/2017   HGB 6.0 (LL) 10/06/2017   HCT 18.1 (L) 10/06/2017   MCV 94.3 10/06/2017   PLT 6 (LL) 10/06/2017   Lab Results  Component Value Date   FERRITIN 1,608 (H) 08/23/2017   IRON 98 08/23/2017   TIBC 210 (L) 08/23/2017   UIBC 112 08/23/2017   IRONPCTSAT 47 08/23/2017   Lab Results  Component Value Date   RBC 1.92 (L) 10/06/2017   Lab Results  Component Value Date   KPAFRELGTCHN 8,670.4 (H) 09/21/2017   LAMBDASER 1.7 (L) 09/21/2017   KAPLAMBRATIO 5,100.24 (H) 09/21/2017   Lab Results  Component Value Date   IGGSERUM 180 (L) 09/21/2017   IGA 26 (L) 09/21/2017   IGMSERUM 6 (L) 09/21/2017   Lab Results  Component Value Date   TOTALPROTELP 4.9 (L) 09/21/2017   ALBUMINELP 2.8 (L) 09/21/2017   A1GS 0.3 09/21/2017   A2GS 0.8 09/21/2017   BETS 0.7 09/21/2017   GAMS 0.3 (L) 09/21/2017   MSPIKE 0.1 (H) 09/21/2017     Chemistry      Component Value Date/Time   NA 136 10/06/2017 0825   NA 148 (H) 03/17/2017 1210   K 4.1 10/06/2017 0825   K 4.4 03/17/2017 1210   CL 105 10/06/2017 0825   CL 106 03/17/2017 1210   CO2 19 10/06/2017 0825   CO2 27 03/17/2017 1210   BUN 90 (H) 10/06/2017 0825   BUN 23 (H) 03/17/2017 1210   CREATININE 4.70 (HH) 10/06/2017 0825   CREATININE 1.1 03/17/2017 1210      Component Value Date/Time   CALCIUM 9.1 10/06/2017 0825   CALCIUM 9.1 03/17/2017 1210   ALKPHOS 68 10/06/2017 0825   ALKPHOS 52 03/17/2017 1210   AST 21 10/06/2017 0825   ALT 22 10/06/2017 0825   ALT 21 03/17/2017 1210   BILITOT 0.5 10/06/2017 0825      Impression and Plan: Jeanne Martinez is a very pleasant 67 yo caucasian female with IgA kappa myeloma with 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   Unfortunately, it is apparent that the Darzalex is done nothing for [place].  Her renal function is deteriorating.  Her light chains are going up quickly.     I spent about an hour with she and her family.  I know them well.  We had a very  long talk about the situation.  This was all face-to-face.  She is not a candidate for aggressive inpatient chemotherapy.  I still think she would manage the VD-PACE protocol at all.  We would have to make significant dosage reductions if she were to manage this.  The FDA just approved a new oral agent for myeloma.  The name of the drug is Xpovio.  I will see if we can somehow get hold of  this.  I will try her on bendamustine.  Bendamustine has some activity with myeloma.  I think this would be reasonable to try until we can get the oral agent.  Overall, this is a very critical situation.  Her renal function is what I truly worry about.  I would hate to see her go on dialysis.  However, this is I think where she is heading.  I do not think that she is a candidate for clinical trial participation.  I know that there is now being utilized CAR-T for refractory myeloma.  She clearly needs to be transfused.  She will get platelets today.  She will then receive 2 units of blood on Monday, July 15.  Because that she was on Darzalex, we have to be cautious with her blood transfusion products.  I have not yet talked about end-of-life issues.  I think this conversation it will be coming up soon if we do not find that she responds.  If we can just at least get some stabilization of her disease, then I think she will be doing better.  It is amazing that she is doing as well as she is doing.  I we will had to follow her incredibly closely.  She will need blood work at least once or twice a week.  Again, it will be her renal function that is going to dictate her prognosis.     Volanda Napoleon, MD 7/12/20192:32 PM

## 2017-10-07 LAB — IGG, IGA, IGM
IgA: 6 mg/dL — ABNORMAL LOW (ref 87–352)
IgG (Immunoglobin G), Serum: 133 mg/dL — ABNORMAL LOW (ref 700–1600)
IgM (Immunoglobulin M), Srm: <5 mg/dL — ABNORMAL LOW (ref 26–217)

## 2017-10-07 LAB — PREPARE PLATELET PHERESIS: Unit division: 0

## 2017-10-07 LAB — BPAM PLATELET PHERESIS
BLOOD PRODUCT EXPIRATION DATE: 201907132359
ISSUE DATE / TIME: 201907121211
Unit Type and Rh: 6200

## 2017-10-09 ENCOUNTER — Inpatient Hospital Stay (HOSPITAL_BASED_OUTPATIENT_CLINIC_OR_DEPARTMENT_OTHER): Payer: Medicare Other | Admitting: Hematology & Oncology

## 2017-10-09 ENCOUNTER — Telehealth: Payer: Self-pay | Admitting: Pharmacy Technician

## 2017-10-09 ENCOUNTER — Encounter: Payer: Self-pay | Admitting: Hematology & Oncology

## 2017-10-09 ENCOUNTER — Telehealth: Payer: Self-pay | Admitting: *Deleted

## 2017-10-09 ENCOUNTER — Inpatient Hospital Stay: Payer: Medicare Other

## 2017-10-09 ENCOUNTER — Other Ambulatory Visit: Payer: Self-pay | Admitting: Family

## 2017-10-09 ENCOUNTER — Other Ambulatory Visit: Payer: Self-pay

## 2017-10-09 VITALS — BP 108/60 | HR 92 | Temp 98.1°F | Resp 20

## 2017-10-09 VITALS — BP 122/53 | HR 107 | Temp 98.2°F | Resp 20 | Wt 99.0 lb

## 2017-10-09 DIAGNOSIS — N179 Acute kidney failure, unspecified: Secondary | ICD-10-CM

## 2017-10-09 DIAGNOSIS — C9000 Multiple myeloma not having achieved remission: Secondary | ICD-10-CM

## 2017-10-09 DIAGNOSIS — D696 Thrombocytopenia, unspecified: Secondary | ICD-10-CM

## 2017-10-09 DIAGNOSIS — D649 Anemia, unspecified: Secondary | ICD-10-CM

## 2017-10-09 DIAGNOSIS — C9002 Multiple myeloma in relapse: Secondary | ICD-10-CM | POA: Diagnosis not present

## 2017-10-09 LAB — PROTEIN ELECTROPHORESIS, SERUM
A/G Ratio: 1.1 (ref 0.7–1.7)
ALBUMIN ELP: 2.6 g/dL — AB (ref 2.9–4.4)
ALPHA-1-GLOBULIN: 0.3 g/dL (ref 0.0–0.4)
Alpha-2-Globulin: 1.1 g/dL — ABNORMAL HIGH (ref 0.4–1.0)
Beta Globulin: 0.7 g/dL (ref 0.7–1.3)
GLOBULIN, TOTAL: 2.4 g/dL (ref 2.2–3.9)
Gamma Globulin: 0.4 g/dL (ref 0.4–1.8)
M-Spike, %: 0.3 g/dL — ABNORMAL HIGH
Total Protein ELP: 5 g/dL — ABNORMAL LOW (ref 6.0–8.5)

## 2017-10-09 LAB — CMP (CANCER CENTER ONLY)
ALBUMIN: 2.4 g/dL — AB (ref 3.5–5.0)
ALT: 24 U/L (ref 10–47)
AST: 27 U/L (ref 11–38)
Alkaline Phosphatase: 70 U/L (ref 26–84)
Anion gap: 16 — ABNORMAL HIGH (ref 5–15)
BUN: 93 mg/dL — AB (ref 7–22)
CALCIUM: 8.9 mg/dL (ref 8.0–10.3)
CHLORIDE: 107 mmol/L (ref 98–108)
CO2: 15 mmol/L — ABNORMAL LOW (ref 18–33)
CREATININE: 4.2 mg/dL — AB (ref 0.60–1.20)
GLUCOSE: 165 mg/dL — AB (ref 73–118)
Potassium: 4.2 mmol/L (ref 3.3–4.7)
SODIUM: 138 mmol/L (ref 128–145)
Total Bilirubin: 0.5 mg/dL (ref 0.2–1.6)
Total Protein: 5.6 g/dL — ABNORMAL LOW (ref 6.4–8.1)

## 2017-10-09 LAB — KAPPA/LAMBDA LIGHT CHAINS: KAPPA FREE LGHT CHN: 21194.2 mg/L — AB (ref 3.3–19.4)

## 2017-10-09 LAB — CBC WITH DIFFERENTIAL (CANCER CENTER ONLY)
BASOS ABS: 0 10*3/uL (ref 0.0–0.1)
BASOS PCT: 0 %
EOS ABS: 0 10*3/uL (ref 0.0–0.5)
EOS PCT: 1 %
HEMATOCRIT: 16.5 % — AB (ref 34.8–46.6)
Hemoglobin: 5.4 g/dL — CL (ref 11.6–15.9)
Lymphocytes Relative: 28 %
Lymphs Abs: 0.6 10*3/uL — ABNORMAL LOW (ref 0.9–3.3)
MCH: 31 pg (ref 26.0–34.0)
MCHC: 32.7 g/dL (ref 32.0–36.0)
MCV: 94.8 fL (ref 81.0–101.0)
MONO ABS: 0.1 10*3/uL (ref 0.1–0.9)
Monocytes Relative: 6 %
Neutro Abs: 1.3 10*3/uL — ABNORMAL LOW (ref 1.5–6.5)
Neutrophils Relative %: 65 %
PLATELETS: 5 10*3/uL — AB (ref 145–400)
RBC: 1.74 MIL/uL — ABNORMAL LOW (ref 3.70–5.32)
RDW: 16.6 % — AB (ref 11.1–15.7)
WBC Count: 2.1 10*3/uL — ABNORMAL LOW (ref 3.9–10.0)

## 2017-10-09 MED ORDER — SODIUM CHLORIDE 0.9 % IV SOLN
250.0000 mL | Freq: Once | INTRAVENOUS | Status: AC
  Administered 2017-10-09: 250 mL via INTRAVENOUS

## 2017-10-09 MED ORDER — HEPARIN SOD (PORK) LOCK FLUSH 100 UNIT/ML IV SOLN
500.0000 [IU] | Freq: Every day | INTRAVENOUS | Status: AC | PRN
  Administered 2017-10-09: 500 [IU]
  Filled 2017-10-09: qty 5

## 2017-10-09 MED ORDER — ACETAMINOPHEN 325 MG PO TABS
ORAL_TABLET | ORAL | Status: AC
  Filled 2017-10-09: qty 2

## 2017-10-09 MED ORDER — DIPHENHYDRAMINE HCL 25 MG PO CAPS
ORAL_CAPSULE | ORAL | Status: AC
  Filled 2017-10-09: qty 1

## 2017-10-09 MED ORDER — DIPHENHYDRAMINE HCL 25 MG PO CAPS
25.0000 mg | ORAL_CAPSULE | Freq: Once | ORAL | Status: AC
  Administered 2017-10-09: 25 mg via ORAL

## 2017-10-09 MED ORDER — SODIUM CHLORIDE 0.9% FLUSH
10.0000 mL | INTRAVENOUS | Status: AC | PRN
  Administered 2017-10-09: 10 mL
  Filled 2017-10-09: qty 10

## 2017-10-09 MED ORDER — ACETAMINOPHEN 325 MG PO TABS
650.0000 mg | ORAL_TABLET | Freq: Once | ORAL | Status: AC
  Administered 2017-10-09: 650 mg via ORAL

## 2017-10-09 MED ORDER — FUROSEMIDE 10 MG/ML IJ SOLN: 20.0000 mg | Freq: Once | INTRAMUSCULAR | Status: DC

## 2017-10-09 MED ORDER — SODIUM CHLORIDE 0.9% FLUSH
3.0000 mL | INTRAVENOUS | Status: DC | PRN
  Filled 2017-10-09: qty 10

## 2017-10-09 NOTE — Telephone Encounter (Signed)
Oral Oncology Patient Advocate Encounter  Received notification from [facility] that Jeanne Martinez has been added to drug formulary and prior authorization has been approved.  PA# [id number] Effective dates: 03/26/17 through 03/27/18  Prescription was sent to [facility].  [facility] will hold the prescription since the patient has decided to do IV chemotherapy.   Oral Oncology Clinic will continue to follow.   [facility] [place] Phone [phone] Fax [phone] 10/09/2017 2:11 PM

## 2017-10-09 NOTE — Telephone Encounter (Signed)
Critical Value Hgb 5.4, Platelets 5, Creatinine 4.2 Dr Marin Olp notified. No orders at this time.

## 2017-10-09 NOTE — Patient Instructions (Signed)

## 2017-10-09 NOTE — Telephone Encounter (Signed)
Spoke with Maudie Mercury at [facility] patient placement for scheduled admission this Wednesday, July 17th for planned chemotherapy.  Inpatient assistant director emailed for planning. Insurance specialist notified for prior auth.

## 2017-10-09 NOTE — Progress Notes (Signed)
Hematology and Oncology Follow Up Visit  Jeanne Martinez [id number] June 02, 1950 67 y.o. 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  She has had a change in mind.  I spent a lot of time last week talking to her about the options. I talked her about trying the inpatient chemotherapy.  I was worried that this might be a little bit too hard on her.  She only weighs 100 pounds.  After talking to her and her family, she is decided that she wants to try the inpatient chemotherapy protocol.  I again had a long talk with her about this.  I know that it has a much better chance of working.  However, the risk of side effect is quite high.  I know that there is a risk of infection.  There is a risk of kidney failure.  There is a risk of bleeding.  There is a risk that she may have side effects and may not improve.  I will definitely have to dose reduce her chemotherapy.  I does do not see any other way that we can do inpatient chemotherapy with  VD-PACE.  She, thankfully, does not have any worsening renal failure.  Her creatinine is 4.2.  With this, we are going to have to again reduced dose her treatments significantly.  At this point, overall, her performance status is ECOG 2.  Medications:  Allergies as of 10/09/2017      Reactions   Milk-related Compounds Anaphylaxis   Throat closes       Medication List        Accurate as of 10/09/17 11:33 AM. Always use your most recent med  list.          aspirin EC 81 MG tablet Take 81 mg by mouth daily.   dexamethasone 4 MG tablet Commonly known as:  DECADRON TAKE 3 TABLETS ONCE WEEKLY WITH FOOD   famciclovir 250 MG tablet Commonly known as:  FAMVIR TAKE 1 TABLET BY MOUTH EVERY DAY   lidocaine-prilocaine cream Commonly known as:  EMLA Apply 1 application topically as needed. Apply to port one hour before appointment   LORazepam 0.5 MG tablet Commonly known as:  ATIVAN Take 0.5 mg by mouth every 6 (six) hours as needed (NAUSEA AND VOMITING).   magnesium oxide 400 MG tablet Commonly known as:  MAG-OX Take 400 mg by mouth daily.   montelukast 10 MG tablet Commonly known as:  SINGULAIR TAKE 1 TABLET BY MOUTH EVERYDAY AT BEDTIME   multivitamin tablet Take 1 tablet by mouth daily.   prochlorperazine 10 MG tablet Commonly known as:  COMPAZINE Take 10 mg by mouth every 6 (six) hours as needed for nausea or vomiting.   Vitamin D 2000 units tablet Take 2,000 Units by mouth daily.       Allergies:  Allergies  Allergen Reactions  . Milk-Related Compounds Anaphylaxis    Throat closes     Past Medical History, Surgical history, Social history, and Family History were reviewed and updated.  Review of Systems: All other 10 point review of systems is negative.   Physical Exam:  weight is 99 lb (44.9 kg). Her oral temperature is 98.2 F (36.8 C). Her blood pressure is 122/53 (abnormal) and her pulse is 107 (abnormal). Her respiration is 20 and oxygen saturation is 100%.   Wt Readings from Last 3 Encounters:  10/09/17 99 lb (44.9 kg)  10/06/17 97 lb 6.4 oz (44.2 kg)  09/29/17 95 lb (43.1 kg)    Ocular: Sclerae unicteric, pupils equal, round and reactive to light Ear-nose-throat: Oropharynx clear, dentition fair Lymphatic: No cervical, supraclavicular or axillary adenopathy Lungs no rales or rhonchi, good excursion bilaterally Heart regular rate and rhythm, no murmur appreciated Abd soft, nontender,  positive bowel sounds, no liver or spleen tip palpated on exam, no fluid wave  MSK no focal spinal tenderness, no joint edema Neuro: non-focal, well-oriented, appropriate affect Breasts: Deferred   Lab Results  Component Value Date   WBC 2.1 (L) 10/09/2017   HGB 5.4 (LL) 10/09/2017   HCT 16.5 (L) 10/09/2017   MCV 94.8 10/09/2017   PLT 5 (LL) 10/09/2017   Lab Results  Component Value Date   FERRITIN 1,608 (H) 08/23/2017   IRON 98 08/23/2017   TIBC 210 (L) 08/23/2017   UIBC 112 08/23/2017   IRONPCTSAT 47 08/23/2017   Lab Results  Component Value Date   RBC 1.74 (L) 10/09/2017   Lab Results  Component Value Date   KPAFRELGTCHN 8,670.4 (H) 09/21/2017   LAMBDASER 1.7 (L) 09/21/2017   KAPLAMBRATIO 5,100.24 (H) 09/21/2017   Lab Results  Component Value Date   IGGSERUM 133 (L) 10/06/2017   IGA 6 (L) 10/06/2017   IGMSERUM <5 (L) 10/06/2017   Lab Results  Component Value Date   TOTALPROTELP 4.9 (L) 09/21/2017   ALBUMINELP 2.8 (L) 09/21/2017   A1GS 0.3 09/21/2017   A2GS 0.8 09/21/2017   BETS 0.7 09/21/2017   GAMS 0.3 (L) 09/21/2017   MSPIKE 0.1 (H) 09/21/2017     Chemistry      Component Value Date/Time   NA 138 10/09/2017 1016   NA 148 (H) 03/17/2017 1210   K 4.2 10/09/2017 1016   K 4.4 03/17/2017 1210   CL 107 10/09/2017 1016   CL 106 03/17/2017 1210   CO2 15 (L) 10/09/2017 1016   CO2 27 03/17/2017 1210   BUN 93 (H) 10/09/2017 1016   BUN 23 (H) 03/17/2017 1210   CREATININE 4.20 (HH) 10/09/2017 1016   CREATININE 1.1 03/17/2017 1210      Component Value Date/Time   CALCIUM 8.9 10/09/2017 1016   CALCIUM 9.1 03/17/2017 1210   ALKPHOS 70 10/09/2017 1016   ALKPHOS 52 03/17/2017 1210   AST 27 10/09/2017 1016   ALT 24 10/09/2017 1016   ALT 21 03/17/2017 1210   BILITOT 0.5 10/09/2017 1016      Impression and Plan: Jeanne Martinez is a very pleasant 67 yo caucasian female with IgA kappa myeloma with 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   For  today, we will go ahead and give her blood.  She had platelets on Friday.  Her platelet count is only 5000 today.  She will had to have platelets when she is in the hospital.  I told him that I thought that the chance of getting her myeloma better with the inpatient regimen will be hopefully over 70-80%.  We will try to get her admitted on Wednesday.  I anticipate that she probably will be hospitalized  for at least 4 or 5 days.   Volanda Napoleon, MD 7/15/201911:33 AM

## 2017-10-10 ENCOUNTER — Encounter: Payer: Self-pay | Admitting: Hematology & Oncology

## 2017-10-10 LAB — BPAM RBC
BLOOD PRODUCT EXPIRATION DATE: 201908162359
BLOOD PRODUCT EXPIRATION DATE: 201908172359
ISSUE DATE / TIME: 201907150816
ISSUE DATE / TIME: 201907150816
UNIT TYPE AND RH: 600
Unit Type and Rh: 600

## 2017-10-10 LAB — TYPE AND SCREEN
ABO/RH(D): A POS
ANTIBODY SCREEN: POSITIVE
UNIT DIVISION: 0
UNIT DIVISION: 0

## 2017-10-11 ENCOUNTER — Inpatient Hospital Stay (HOSPITAL_COMMUNITY): Payer: Medicare Other

## 2017-10-11 ENCOUNTER — Other Ambulatory Visit: Payer: Self-pay

## 2017-10-11 ENCOUNTER — Inpatient Hospital Stay (HOSPITAL_COMMUNITY)
Admission: AD | Admit: 2017-10-11 | Discharge: 2017-10-17 | DRG: 847 | Disposition: A | Discharge status: Home / Self Care | Payer: Medicare Other | Source: Ambulatory Visit | Attending: Hematology & Oncology | Admitting: Hematology & Oncology

## 2017-10-11 ENCOUNTER — Encounter (HOSPITAL_COMMUNITY): Payer: Self-pay

## 2017-10-11 DIAGNOSIS — Z5112 Encounter for antineoplastic immunotherapy: Secondary | ICD-10-CM | POA: Diagnosis not present

## 2017-10-11 DIAGNOSIS — D8989 Other specified disorders involving the immune mechanism, not elsewhere classified: Secondary | ICD-10-CM

## 2017-10-11 DIAGNOSIS — R413 Other amnesia: Secondary | ICD-10-CM | POA: Diagnosis present

## 2017-10-11 DIAGNOSIS — T380X5A Adverse effect of glucocorticoids and synthetic analogues, initial encounter: Secondary | ICD-10-CM | POA: Diagnosis not present

## 2017-10-11 DIAGNOSIS — N183 Chronic kidney disease, stage 3 (moderate): Secondary | ICD-10-CM | POA: Diagnosis present

## 2017-10-11 DIAGNOSIS — I5030 Unspecified diastolic (congestive) heart failure: Secondary | ICD-10-CM

## 2017-10-11 DIAGNOSIS — D61818 Other pancytopenia: Secondary | ICD-10-CM | POA: Diagnosis present

## 2017-10-11 DIAGNOSIS — Z5111 Encounter for antineoplastic chemotherapy: Secondary | ICD-10-CM | POA: Diagnosis present

## 2017-10-11 DIAGNOSIS — C9000 Multiple myeloma not having achieved remission: Secondary | ICD-10-CM

## 2017-10-11 DIAGNOSIS — J811 Chronic pulmonary edema: Secondary | ICD-10-CM

## 2017-10-11 DIAGNOSIS — F0390 Unspecified dementia without behavioral disturbance: Secondary | ICD-10-CM | POA: Diagnosis present

## 2017-10-11 DIAGNOSIS — R410 Disorientation, unspecified: Secondary | ICD-10-CM | POA: Diagnosis not present

## 2017-10-11 DIAGNOSIS — Z9981 Dependence on supplemental oxygen: Secondary | ICD-10-CM | POA: Diagnosis not present

## 2017-10-11 DIAGNOSIS — N19 Unspecified kidney failure: Secondary | ICD-10-CM | POA: Diagnosis not present

## 2017-10-11 DIAGNOSIS — E8770 Fluid overload, unspecified: Secondary | ICD-10-CM | POA: Diagnosis not present

## 2017-10-11 DIAGNOSIS — R0602 Shortness of breath: Secondary | ICD-10-CM | POA: Diagnosis not present

## 2017-10-11 DIAGNOSIS — D696 Thrombocytopenia, unspecified: Secondary | ICD-10-CM

## 2017-10-11 DIAGNOSIS — Z91011 Allergy to milk products: Secondary | ICD-10-CM

## 2017-10-11 LAB — COMPREHENSIVE METABOLIC PANEL
ALK PHOS: 102 U/L (ref 38–126)
ALT: 35 U/L (ref 0–44)
ANION GAP: 11 (ref 5–15)
AST: 25 U/L (ref 15–41)
Albumin: 2.5 g/dL — ABNORMAL LOW (ref 3.5–5.0)
BILIRUBIN TOTAL: 0.6 mg/dL (ref 0.3–1.2)
BUN: 90 mg/dL — ABNORMAL HIGH (ref 8–23)
CALCIUM: 8.8 mg/dL — AB (ref 8.9–10.3)
CO2: 17 mmol/L — AB (ref 22–32)
CREATININE: 4.48 mg/dL — AB (ref 0.44–1.00)
Chloride: 111 mmol/L (ref 98–111)
GFR calc non Af Amer: 9 mL/min — ABNORMAL LOW (ref >60)
GFR, EST AFRICAN AMERICAN: 11 mL/min — AB (ref >60)
GLUCOSE: 181 mg/dL — AB (ref 70–99)
Potassium: 3.9 mmol/L (ref 3.5–5.1)
SODIUM: 139 mmol/L (ref 135–145)
TOTAL PROTEIN: 5.4 g/dL — AB (ref 6.5–8.1)

## 2017-10-11 LAB — CBC WITH DIFFERENTIAL/PLATELET
Basophils Absolute: 0 10*3/uL (ref 0.0–0.1)
Basophils Relative: 0 %
EOS ABS: 0 10*3/uL (ref 0.0–0.7)
Eosinophils Relative: 0 %
HCT: 26.5 % — ABNORMAL LOW (ref 36.0–46.0)
HEMOGLOBIN: 9 g/dL — AB (ref 12.0–15.0)
LYMPHS ABS: 0.4 10*3/uL — AB (ref 0.7–4.0)
LYMPHS PCT: 19 %
MCH: 29.8 pg (ref 26.0–34.0)
MCHC: 34 g/dL (ref 30.0–36.0)
MCV: 87.7 fL (ref 78.0–100.0)
MONOS PCT: 5 %
Monocytes Absolute: 0.1 10*3/uL (ref 0.1–1.0)
NEUTROS PCT: 76 %
Neutro Abs: 1.7 10*3/uL (ref 1.7–7.7)
Platelets: <5 10*3/uL — CL (ref 150–400)
RBC: 3.02 MIL/uL — AB (ref 3.87–5.11)
RDW: 17.3 % — ABNORMAL HIGH (ref 11.5–15.5)
WBC: 2.2 10*3/uL — AB (ref 4.0–10.5)

## 2017-10-11 LAB — ECHOCARDIOGRAM COMPLETE
Height: 62 in
Weight: 1608.48 oz

## 2017-10-11 LAB — TYPE AND SCREEN
ABO/RH(D): A POS
Antibody Screen: POSITIVE

## 2017-10-11 MED ORDER — SODIUM CHLORIDE 0.9 % IV SOLN
40.0000 mg | Freq: Once | INTRAVENOUS | Status: AC
  Administered 2017-10-11: 40 mg via INTRAVENOUS
  Filled 2017-10-11: qty 4

## 2017-10-11 MED ORDER — ACETAMINOPHEN 325 MG PO TABS
650.0000 mg | ORAL_TABLET | Freq: Once | ORAL | Status: AC
  Administered 2017-10-11: 650 mg via ORAL
  Filled 2017-10-11: qty 2

## 2017-10-11 MED ORDER — METHYLPREDNISOLONE SODIUM SUCC 125 MG IJ SOLR: 80.0000 mg | Freq: Once | INTRAMUSCULAR | Status: DC

## 2017-10-11 MED ORDER — FAMCICLOVIR 500 MG PO TABS
250.0000 mg | ORAL_TABLET | Freq: Every day | ORAL | Status: DC
  Administered 2017-10-11 – 2017-10-17 (×7): 250 mg via ORAL
  Filled 2017-10-11 (×8): qty 0.5

## 2017-10-11 MED ORDER — PROCHLORPERAZINE MALEATE 10 MG PO TABS: 10.0000 mg | ORAL_TABLET | Freq: Four times a day (QID) | ORAL | Status: DC | PRN

## 2017-10-11 MED ORDER — SODIUM CHLORIDE 0.9 % IV SOLN: INTRAVENOUS | Status: DC

## 2017-10-11 MED ORDER — LORAZEPAM 0.5 MG PO TABS: 0.5000 mg | ORAL_TABLET | Freq: Four times a day (QID) | ORAL | Status: DC | PRN

## 2017-10-11 MED ORDER — SODIUM CHLORIDE 0.9% IV SOLUTION: Freq: Once | INTRAVENOUS | Status: DC

## 2017-10-11 MED ORDER — HEPARIN SOD (PORK) LOCK FLUSH 100 UNIT/ML IV SOLN: 250.0000 [IU] | INTRAVENOUS | Status: DC | PRN

## 2017-10-11 MED ORDER — SODIUM CHLORIDE 0.9 % IV SOLN
INTRAVENOUS | Status: AC
  Administered 2017-10-11 – 2017-10-12 (×2): via INTRAVENOUS

## 2017-10-11 MED ORDER — ADULT MULTIVITAMIN W/MINERALS CH
1.0000 | ORAL_TABLET | Freq: Every day | ORAL | Status: DC
  Administered 2017-10-11 – 2017-10-16 (×6): 1 via ORAL
  Filled 2017-10-11 (×6): qty 1

## 2017-10-11 MED ORDER — VITAMIN D 1000 UNITS PO TABS
2000.0000 [IU] | ORAL_TABLET | Freq: Every day | ORAL | Status: DC
  Administered 2017-10-11 – 2017-10-17 (×7): 2000 [IU] via ORAL
  Filled 2017-10-11 (×8): qty 2

## 2017-10-11 NOTE — Progress Notes (Signed)
  Note was started in error - no entries made.

## 2017-10-11 NOTE — Progress Notes (Signed)
  Echocardiogram 2D Echocardiogram has been performed.  Merrie Roof F 10/11/2017, 4:06 PM

## 2017-10-11 NOTE — Progress Notes (Signed)
CRITICAL VALUE ALERT  Critical Value:  PLT < 5  Date & Time Notied:  10/11/17 ,1502  Provider Notified: Dr Marin Olp  Orders Received/Actions taken: yes

## 2017-10-11 NOTE — H&P (Signed)
Referral MD  Reason for Referral: Refractory kappa light chain myeloma  No chief complaint on file. : I am here for chemotherapy.  HPI: Jeanne Martinez is a very nice 67 year old white female.  She has a refractory light chain myeloma.  She actually presented a year ago from [place].  She had renal failure up there.  I will think she was on dialysis however.  She was treated with chemotherapy in which she responded very nicely down here.  However, she began to show some resistance in the springtime.  No matter what we tried, her Kappa light chain's kept going up.  She recently was on daratumumab.  I thought this really would help but it did not.  She has 21,000 mg/L of light chain in her blood right now.  She has renal insufficiency that is going to develop into a renal failure if her light chains cannot be improved.  She has pancytopenia because of marrow involvement by myeloma cells.  She now is being admitted for salvage chemotherapy with VD-PACE.  She has some slight memory deficiency.  This is not new.  She is very nice.  She is not that big but is actually quite resilient.  She has a strong constitution.  She has a Port-A-Cath in already.  She had a echocardiogram done today.  Her ejection fraction was 55-60%.  She had good wall motion.  There is no valvular issues.  She is having no problems with urine.  She is having no diarrhea.  She has not complained of any bony pain.  She is eating okay.  There is no nausea or vomiting.  She is having no dysphasia or odynophagia.  Overall, her performance status is ECOG 1.   Past Medical History:  Diagnosis Date  . Anemia of chronic renal failure, stage 3 (moderate) ([place]) 08/08/2017  . Cancer ([place])    multiple myeloma  . Counseling regarding goals of care 08/08/2017  . Hypertension    patient denies  . Kappa light chain deposition disease ([place]) 08/08/2017  . Renal disorder    acute renal failure due to myeloma  :  Past Surgical  History:  Procedure Laterality Date  . IR IMAGING GUIDED PORT INSERTION  09/19/2017  . NO PAST SURGERIES    :   Current Facility-Administered Medications:  .  0.9 %  sodium chloride infusion (Manually program via Guardrails IV Fluids), , Intravenous, Once, Trevel Dillenbeck R, MD .  0.9 %  sodium chloride infusion, , Intravenous, Continuous, Floye Fesler, Rudell Cobb, MD .  0.9 %  sodium chloride infusion, , Intravenous, Continuous, Williard Keller, Rudell Cobb, MD, Last Rate: 100 mL/hr at 10/11/17 1223 .  cholecalciferol (VITAMIN D) tablet 2,000 Units, 2,000 Units, Oral, Daily, Volanda Napoleon, MD, 2,000 Units at 10/11/17 1317 .  famciclovir [facility]) tablet 250 mg, 250 mg, Oral, Daily, Ashonti Leandro R, MD, 250 mg at 10/11/17 1318 .  heparin lock flush 100 unit/mL, 250 Units, Intracatheter, PRN, [place], Sarah M, NP .  LORazepam (ATIVAN) tablet 0.5 mg, 0.5 mg, Oral, Q6H PRN, Volanda Napoleon, MD .  methylPREDNISolone sodium succinate (SOLU-MEDROL) 125 mg/2 mL injection 80 mg, 80 mg, Intravenous, Once, Camden Knotek, Rudell Cobb, MD .  multivitamin with minerals tablet 1 tablet, 1 tablet, Oral, Daily, Volanda Napoleon, MD, 1 tablet at 10/11/17 1318 .  prochlorperazine (COMPAZINE) tablet 10 mg, 10 mg, Oral, Q6H PRN, Volanda Napoleon, MD:  . sodium chloride   Intravenous Once  . cholecalciferol  2,000 Units Oral Daily  .  famciclovir  250 mg Oral Daily  . methylPREDNISolone (SOLU-MEDROL) injection  80 mg Intravenous Once  . multivitamin with minerals  1 tablet Oral Daily  :  Allergies  Allergen Reactions  . Milk-Related Compounds Anaphylaxis    Throat closes the patient cannot drink milk  but can eat milk products  Including cheese  :  Family History  Problem Relation Age of Onset  . Multiple myeloma Mother   . Cancer Father   :  Social History   Socioeconomic History  . Marital status: Married    Spouse name: Not on file  . Number of children: Not on file  . Years of education: Not on file  . Highest  education level: Not on file  Occupational History  . Occupation: retired    Comment: Designer, television/film set  Social Needs  . Financial resource strain: Not on file  . Food insecurity:    Worry: Not on file    Inability: Not on file  . Transportation needs:    Medical: Not on file    Non-medical: Not on file  Tobacco Use  . Smoking status: Never Smoker  . Smokeless tobacco: Never Used  Substance and Sexual Activity  . Alcohol use: No  . Drug use: No  . Sexual activity: Not on file  Lifestyle  . Physical activity:    Days per week: Not on file    Minutes per session: Not on file  . Stress: Not on file  Relationships  . Social connections:    Talks on phone: Not on file    Gets together: Not on file    Attends religious service: Not on file    Active member of club or organization: Not on file    Attends meetings of clubs or organizations: Not on file    Relationship status: Not on file  . Intimate partner violence:    Fear of current or ex partner: Not on file    Emotionally abused: Not on file    Physically abused: Not on file    Forced sexual activity: Not on file  Other Topics Concern  . Not on file  Social History Narrative  . Not on file  :   Thin but fairly well-nourished white female in no obvious distress.  Vital signs show a temperature of 98.5.  Pulse 101.  Blood pressure 120/67.  Head neck exam shows no ocular or oral lesions.  There are no palpable cervical or supra clavicular lymph nodes.  Lungs are clear bilaterally.  Cardiac exam slightly tachycardic but regular.  She has no murmurs, rubs or bruits.  Abdomen is soft.  She has decent bowel sounds.  There is no fluid wave.  There is no palpable liver or spleen tip.  Back exam shows no tenderness over the spine ribs or hips.  Extremities shows no clubbing, cyanosis or edema.  Skin exam shows some scattered ecchymoses.  She may have some slight petechia in her lower legs.  Neurological exam shows no obvious  neurological deficits.  Exam: Patient Vitals for the past 24 hrs:  BP Temp Temp src Pulse Resp SpO2 Height Weight  10/11/17 1800 - 98.8 F (37.1 C) Oral (!) 101 20 - - -  10/11/17 1745 128/67 98.5 F (36.9 C) Oral 100 20 100 % - -  10/11/17 1515 120/62 98.6 F (37 C) Oral (!) 103 18 100 % - -  10/11/17 1223 110/67 98.9 F (37.2 C) Oral 99 18 98 % 5' 2"  (  1.575 m) 100 lb 8.5 oz (45.6 kg)     Recent Labs    10/09/17 1016 10/11/17 1410  WBC 2.1* 2.2*  HGB 5.4* 9.0*  HCT 16.5* 26.5*  PLT 5* <5*   Recent Labs    10/09/17 1016 10/11/17 1410  NA 138 139  K 4.2 3.9  CL 107 111  CO2 15* 17*  GLUCOSE 165* 181*  BUN 93* 90*  CREATININE 4.20* 4.48*  CALCIUM 8.9 8.8*    Blood smear review: None  Pathology: None    Assessment and Plan: Jeanne Martinez is a 67 year old white female.  She has refractory kappa light chain myeloma.  This really is her last chance to try to help her out.  We are going to be "cutting it close" with respect to whether or not she responds and whether or not she goes into frank renal failure.  I know that this is a very aggressive regimen.  Her family understands very well that this does have a lot of side effects.  She will lose her hair.  She could still end up with renal failure.  I am not sure if put her on dialysis is going to make a difference for her if we are not going to be able to get her myeloma to respond.  I have not yet had the conversation with she and her family regarding end-of-life issues.  Again we have to dose reduce her chemotherapy.  I will try to dose reduce it so that it was still has some effectiveness.  I suspect she will need blood product support.  We will have to watch her blood counts closely.  I very much appreciate all the help that she will get from the staff up on [facility].  I appreciate their compassion and professionalism.  We will hopefully get treatment started on Thursday.  Again she likely will need transfusion  support.  Her platelet count is less than 5000 so I will give her some platelets today.  Lattie Haw, MD  Darlyn Chamber 17:14

## 2017-10-12 ENCOUNTER — Other Ambulatory Visit: Payer: Self-pay | Admitting: Hematology & Oncology

## 2017-10-12 DIAGNOSIS — N19 Unspecified kidney failure: Secondary | ICD-10-CM

## 2017-10-12 DIAGNOSIS — Z5112 Encounter for antineoplastic immunotherapy: Secondary | ICD-10-CM

## 2017-10-12 LAB — URINALYSIS, ROUTINE W REFLEX MICROSCOPIC
Bilirubin Urine: NEGATIVE
GLUCOSE, UA: 50 mg/dL — AB
Hgb urine dipstick: NEGATIVE
Ketones, ur: NEGATIVE mg/dL
Leukocytes, UA: NEGATIVE
Nitrite: NEGATIVE
PH: 5 (ref 5.0–8.0)
PROTEIN: 30 mg/dL — AB
Specific Gravity, Urine: 1.009 (ref 1.005–1.030)

## 2017-10-12 LAB — CBC WITH DIFFERENTIAL/PLATELET
Basophils Absolute: 0 10*3/uL (ref 0.0–0.1)
Basophils Relative: 0 %
EOS ABS: 0 10*3/uL (ref 0.0–0.7)
EOS PCT: 0 %
HCT: 24.1 % — ABNORMAL LOW (ref 36.0–46.0)
Hemoglobin: 8.3 g/dL — ABNORMAL LOW (ref 12.0–15.0)
LYMPHS ABS: 0.4 10*3/uL — AB (ref 0.7–4.0)
Lymphocytes Relative: 19 %
MCH: 30 pg (ref 26.0–34.0)
MCHC: 34.4 g/dL (ref 30.0–36.0)
MCV: 87 fL (ref 78.0–100.0)
MONO ABS: 0.1 10*3/uL (ref 0.1–1.0)
MONOS PCT: 4 %
NEUTROS PCT: 77 %
Neutro Abs: 1.6 10*3/uL — ABNORMAL LOW (ref 1.7–7.7)
PLATELETS: 13 10*3/uL — AB (ref 150–400)
RBC: 2.77 MIL/uL — ABNORMAL LOW (ref 3.87–5.11)
RDW: 17.2 % — ABNORMAL HIGH (ref 11.5–15.5)
WBC: 2.1 10*3/uL — AB (ref 4.0–10.5)

## 2017-10-12 LAB — COMPREHENSIVE METABOLIC PANEL
ALK PHOS: 110 U/L (ref 38–126)
ALT: 33 U/L (ref 0–44)
ANION GAP: 11 (ref 5–15)
AST: 29 U/L (ref 15–41)
Albumin: 2.2 g/dL — ABNORMAL LOW (ref 3.5–5.0)
BUN: 86 mg/dL — ABNORMAL HIGH (ref 8–23)
CALCIUM: 8.4 mg/dL — AB (ref 8.9–10.3)
CHLORIDE: 113 mmol/L — AB (ref 98–111)
CO2: 16 mmol/L — AB (ref 22–32)
Creatinine, Ser: 4.49 mg/dL — ABNORMAL HIGH (ref 0.44–1.00)
GFR calc non Af Amer: 9 mL/min — ABNORMAL LOW (ref >60)
GFR, EST AFRICAN AMERICAN: 11 mL/min — AB (ref >60)
Glucose, Bld: 140 mg/dL — ABNORMAL HIGH (ref 70–99)
Potassium: 3.9 mmol/L (ref 3.5–5.1)
SODIUM: 140 mmol/L (ref 135–145)
Total Bilirubin: 0.5 mg/dL (ref 0.3–1.2)
Total Protein: 5 g/dL — ABNORMAL LOW (ref 6.5–8.1)

## 2017-10-12 LAB — MAGNESIUM: Magnesium: 1.6 mg/dL — ABNORMAL LOW (ref 1.7–2.4)

## 2017-10-12 LAB — PREPARE PLATELET PHERESIS: Unit division: 0

## 2017-10-12 LAB — BPAM PLATELET PHERESIS
BLOOD PRODUCT EXPIRATION DATE: 201907182359
ISSUE DATE / TIME: 201907171747
Unit Type and Rh: 6200

## 2017-10-12 LAB — CREATININE, SERUM
Creatinine, Ser: 4.51 mg/dL — ABNORMAL HIGH (ref 0.44–1.00)
GFR calc Af Amer: 11 mL/min — ABNORMAL LOW (ref >60)
GFR calc non Af Amer: 9 mL/min — ABNORMAL LOW (ref >60)

## 2017-10-12 LAB — PHOSPHORUS: PHOSPHORUS: 4.6 mg/dL (ref 2.5–4.6)

## 2017-10-12 LAB — ABO/RH: ABO/RH(D): A POS

## 2017-10-12 MED ORDER — ENOXAPARIN SODIUM 30 MG/0.3ML ~~LOC~~ SOLN: 30.0000 mg | SUBCUTANEOUS | Status: DC

## 2017-10-12 MED ORDER — ALTEPLASE 2 MG IJ SOLR: 2.0000 mg | Freq: Once | INTRAMUSCULAR | Status: DC | PRN

## 2017-10-12 MED ORDER — SODIUM CHLORIDE 0.9 % IV SOLN
7.5000 mg/m2 | Freq: Once | INTRAVENOUS | Status: AC
  Administered 2017-10-12: 10 mg via INTRAVENOUS
  Filled 2017-10-12: qty 5

## 2017-10-12 MED ORDER — SODIUM CHLORIDE 0.9% FLUSH: 10.0000 mL | INTRAVENOUS | Status: DC | PRN

## 2017-10-12 MED ORDER — SODIUM CHLORIDE 0.9% FLUSH: 3.0000 mL | INTRAVENOUS | Status: DC | PRN

## 2017-10-12 MED ORDER — SODIUM CHLORIDE 0.9 % IV SOLN
Freq: Once | INTRAVENOUS | Status: AC
  Administered 2017-10-12: 12:00:00 via INTRAVENOUS
  Filled 2017-10-12: qty 5

## 2017-10-12 MED ORDER — SODIUM CHLORIDE 0.9 % IV SOLN: INTRAVENOUS | Status: DC

## 2017-10-12 MED ORDER — HEPARIN SOD (PORK) LOCK FLUSH 100 UNIT/ML IV SOLN
500.0000 [IU] | Freq: Once | INTRAVENOUS | Status: AC | PRN
  Administered 2017-10-17: 500 [IU]
  Filled 2017-10-12: qty 5

## 2017-10-12 MED ORDER — COLD PACK MISC ONCOLOGY
1.0000 | Freq: Once | Status: DC | PRN
  Filled 2017-10-12: qty 1

## 2017-10-12 MED ORDER — BORTEZOMIB CHEMO IV INJECTION 3.5 MG(1MG/ML)
1.3000 mg/m2 | Freq: Once | INTRAMUSCULAR | Status: AC
  Administered 2017-10-12: 1.8 mg via INTRAVENOUS
  Filled 2017-10-12: qty 1.8

## 2017-10-12 MED ORDER — HOT PACK MISC ONCOLOGY
1.0000 | Freq: Once | Status: DC | PRN
  Filled 2017-10-12: qty 1

## 2017-10-12 MED ORDER — POTASSIUM CHLORIDE 2 MEQ/ML IV SOLN
Freq: Once | INTRAVENOUS | Status: AC
  Administered 2017-10-12: 12:00:00 via INTRAVENOUS
  Filled 2017-10-12: qty 10

## 2017-10-12 MED ORDER — SODIUM CHLORIDE 0.9 % IV SOLN
INTRAVENOUS | Status: DC
  Administered 2017-10-12 – 2017-10-15 (×4): via INTRAVENOUS

## 2017-10-12 MED ORDER — HEPARIN SOD (PORK) LOCK FLUSH 100 UNIT/ML IV SOLN
250.0000 [IU] | Freq: Once | INTRAVENOUS | Status: AC | PRN
  Administered 2017-10-17: 250 [IU]

## 2017-10-12 MED ORDER — SODIUM CHLORIDE 0.9 % IV SOLN
Freq: Once | INTRAVENOUS | Status: AC
  Administered 2017-10-12: 7 mg via INTRAVENOUS
  Filled 2017-10-12: qty 7

## 2017-10-12 MED ORDER — PALONOSETRON HCL INJECTION 0.25 MG/5ML
0.2500 mg | Freq: Once | INTRAVENOUS | Status: AC
  Administered 2017-10-12: 0.25 mg via INTRAVENOUS
  Filled 2017-10-12: qty 5

## 2017-10-12 MED ORDER — DEXAMETHASONE 4 MG PO TABS
40.0000 mg | ORAL_TABLET | Freq: Once | ORAL | Status: AC
Start: 2017-10-12 — End: 2017-10-12
  Administered 2017-10-12: 40 mg via ORAL
  Filled 2017-10-12: qty 10

## 2017-10-12 NOTE — Progress Notes (Signed)
For right now, everything looks okay.  She had echocardiogram yesterday.  She has a very good ejection fraction.  Her LVEF was 55-60%.  We we will start treatment today.  I collaborated with the pharmacy last night to make sure that the dosing was correct.  I realize that she has profound renal insufficiency.  However, we really have to treat her with this protocol as we just have nothing else left to use.  I think that she will do okay with the VD-PACE.  Again, we are dose reducing the chemotherapy agents.  She is having no nausea or vomiting.  She is having no obvious bleeding.  Therefore labs not yet back today outside of the creatinine which is 4.51.  She is still urinating.  Her physical exam is unchanged from last night.  Her vital signs show temperature of 98.4.  Pulse 98.  Blood pressure 114/69.  She did have a temperature spike last night of 102.5.  This may been from the platelets that she received.  Her lungs are clear.  Oral exam is negative for any mucositis.  Cardiac exam regular rate and rhythm.  Abdomen is soft.  There is no palpable liver or spleen tip.  Extremities shows no clubbing, cyanosis or edema.  Neurological exam is nonfocal.  Jeanne Martinez has refractory kappa light chain myeloma.  We are trying to help her and try to improve her light chain level with VD-PACE.  She will start treatment today.  We will continue to monitor her labs daily.  I think the wonderful staff on [address]. for their fantastic care of Jeanne Martinez.  I know the family is very impressed with the compassion being shown.  Lattie Haw, MD  Psalm 143:11

## 2017-10-13 ENCOUNTER — Other Ambulatory Visit: Payer: Medicare Other

## 2017-10-13 ENCOUNTER — Ambulatory Visit: Payer: Medicare Other

## 2017-10-13 ENCOUNTER — Ambulatory Visit: Payer: Medicare Other | Admitting: Hematology & Oncology

## 2017-10-13 DIAGNOSIS — R410 Disorientation, unspecified: Secondary | ICD-10-CM

## 2017-10-13 LAB — COMPREHENSIVE METABOLIC PANEL
ALT: 30 U/L (ref 0–44)
AST: 19 U/L (ref 15–41)
Albumin: 2.1 g/dL — ABNORMAL LOW (ref 3.5–5.0)
Alkaline Phosphatase: 106 U/L (ref 38–126)
Anion gap: 9 (ref 5–15)
BUN: 80 mg/dL — AB (ref 8–23)
CHLORIDE: 119 mmol/L — AB (ref 98–111)
CO2: 14 mmol/L — ABNORMAL LOW (ref 22–32)
CREATININE: 3.86 mg/dL — AB (ref 0.44–1.00)
Calcium: 7.8 mg/dL — ABNORMAL LOW (ref 8.9–10.3)
GFR calc Af Amer: 13 mL/min — ABNORMAL LOW (ref >60)
GFR, EST NON AFRICAN AMERICAN: 11 mL/min — AB (ref >60)
Glucose, Bld: 215 mg/dL — ABNORMAL HIGH (ref 70–99)
POTASSIUM: 4.7 mmol/L (ref 3.5–5.1)
SODIUM: 142 mmol/L (ref 135–145)
Total Bilirubin: 0.4 mg/dL (ref 0.3–1.2)
Total Protein: 4.9 g/dL — ABNORMAL LOW (ref 6.5–8.1)

## 2017-10-13 LAB — URINE CULTURE

## 2017-10-13 LAB — CBC WITH DIFFERENTIAL/PLATELET
BASOS ABS: 0 10*3/uL (ref 0.0–0.1)
BASOS PCT: 0 %
EOS ABS: 0 10*3/uL (ref 0.0–0.7)
EOS PCT: 0 %
HCT: 25.5 % — ABNORMAL LOW (ref 36.0–46.0)
Hemoglobin: 8.8 g/dL — ABNORMAL LOW (ref 12.0–15.0)
LYMPHS PCT: 15 %
Lymphs Abs: 0.4 10*3/uL — ABNORMAL LOW (ref 0.7–4.0)
MCH: 30.1 pg (ref 26.0–34.0)
MCHC: 34.5 g/dL (ref 30.0–36.0)
MCV: 87.3 fL (ref 78.0–100.0)
MONO ABS: 0.2 10*3/uL (ref 0.1–1.0)
Monocytes Relative: 6 %
Neutro Abs: 2.1 10*3/uL (ref 1.7–7.7)
Neutrophils Relative %: 79 %
PLATELETS: 6 10*3/uL — AB (ref 150–400)
RBC: 2.92 MIL/uL — AB (ref 3.87–5.11)
RDW: 17.4 % — ABNORMAL HIGH (ref 11.5–15.5)
WBC: 2.6 10*3/uL — AB (ref 4.0–10.5)

## 2017-10-13 LAB — MAGNESIUM: MAGNESIUM: 1.9 mg/dL (ref 1.7–2.4)

## 2017-10-13 LAB — GLUCOSE, CAPILLARY: GLUCOSE-CAPILLARY: 206 mg/dL — AB (ref 70–99)

## 2017-10-13 MED ORDER — POTASSIUM CHLORIDE 2 MEQ/ML IV SOLN
Freq: Once | INTRAVENOUS | Status: AC
  Administered 2017-10-13: 14:00:00 via INTRAVENOUS
  Filled 2017-10-13: qty 10

## 2017-10-13 MED ORDER — SODIUM CHLORIDE 0.9% IV SOLUTION
Freq: Once | INTRAVENOUS | Status: AC
  Administered 2017-10-13: 12:00:00 via INTRAVENOUS

## 2017-10-13 MED ORDER — DIPHENHYDRAMINE HCL 25 MG PO CAPS
25.0000 mg | ORAL_CAPSULE | ORAL | Status: DC | PRN
  Administered 2017-10-13: 25 mg via ORAL
  Filled 2017-10-13: qty 1

## 2017-10-13 MED ORDER — SODIUM CHLORIDE 0.9 % IV SOLN
7.5000 mg/m2 | Freq: Once | INTRAVENOUS | Status: AC
  Administered 2017-10-13: 10 mg via INTRAVENOUS
  Filled 2017-10-13: qty 5

## 2017-10-13 MED ORDER — DEXAMETHASONE 4 MG PO TABS
40.0000 mg | ORAL_TABLET | Freq: Once | ORAL | Status: AC
  Administered 2017-10-13: 40 mg via ORAL
  Filled 2017-10-13: qty 10

## 2017-10-13 MED ORDER — ACETAMINOPHEN 325 MG PO TABS
650.0000 mg | ORAL_TABLET | ORAL | Status: DC | PRN
  Administered 2017-10-13: 650 mg via ORAL
  Filled 2017-10-13: qty 2

## 2017-10-13 MED ORDER — BIOTENE DRY MOUTH MT LIQD
15.0000 mL | OROMUCOSAL | Status: DC
  Administered 2017-10-13 – 2017-10-16 (×9): 15 mL via OROMUCOSAL
  Filled 2017-10-13 (×2): qty 15

## 2017-10-13 MED ORDER — LOPERAMIDE HCL 2 MG PO CAPS: 2.0000 mg | ORAL_CAPSULE | ORAL | Status: DC | PRN

## 2017-10-13 MED ORDER — SODIUM CHLORIDE 0.9 % IV SOLN
Freq: Once | INTRAVENOUS | Status: AC
  Administered 2017-10-13: 7 mg via INTRAVENOUS
  Filled 2017-10-13: qty 7

## 2017-10-13 NOTE — Progress Notes (Signed)
Pt presenting with mild periorbital edema and slight drainage noted as well. Pt states vision is clear, no double vision noted. Pt does note some itching but states it is tolerable at this time. No other complaints at this time. Dr. Marin Olp paged. Will continue to monitor.

## 2017-10-13 NOTE — Progress Notes (Signed)
Pt is sweaty but c/o being cold. Rectal temp 95.7. Called on call physician Burr Medico) , advised to check temp in an hr and call back if pt spikes fever.

## 2017-10-13 NOTE — Progress Notes (Signed)
Jeanne Martinez is relatively stable.  There have been some temperature fluctuations.  I am not sure what to really make of the low temperature.  I do not know if this might be somehow reflective of the myeloma.  I have to imagine that she has some element of immunocompromisation.  She also has some confusion.  She has some underlying baseline dementia.  This is very mild.  However, being in the hospital, might potentially cause some confusion.  We really have to get her out of bed and get her sitting and walking.  I just do not want to see her in the bed all the time.  It seems like she is eating okay.  There is no nausea or vomiting.  So far, she is tolerating chemotherapy fairly well.  She is on day #2/5 of treatment.  So far, she is doing fairly well with this.  There is no nausea or vomiting.  She had a little bit of diarrhea yesterday.  There is no mouth sores.  Her labs show her white cell count to be 2.6.  Hemoglobin 8.8.  Lately count 6.  I will have to give her some platelets again.  Her creatinine is 3.86.  Her blood sugar is 215.  Her potassium is 4.7.  Calcium is 9 with an albumin of 2.1.  On her physical exam, her temperature is 96.3.  Pulse 87.  Blood pressure 105/67.  Her head neck exam shows no mucositis.  Her pupils react appropriately.  There is no adenopathy in the neck.  Lungs are clear bilaterally.  Cardiac exam regular rate and rhythm.  She has no murmurs.  Abdomen is soft.  She has decent bowel sounds.  There is no fluid wave.  There is no palpable liver or spleen tip.  Extremities shows no clubbing, cyanosis or edema.  Neurological exam shows no obvious neurological deficits.  Ms. Ballowe has refractory light chain myeloma.  She is on VD-PACE.  She is doing okay with this so far.  She we will get platelets today.  I appreciate the great care that she is getting from the staff up on [address].  Lattie Haw, MD  Oswaldo Milian 57:19

## 2017-10-14 ENCOUNTER — Inpatient Hospital Stay (HOSPITAL_COMMUNITY): Payer: Medicare Other

## 2017-10-14 DIAGNOSIS — R0602 Shortness of breath: Secondary | ICD-10-CM

## 2017-10-14 DIAGNOSIS — E8770 Fluid overload, unspecified: Secondary | ICD-10-CM

## 2017-10-14 DIAGNOSIS — Z9981 Dependence on supplemental oxygen: Secondary | ICD-10-CM

## 2017-10-14 LAB — CBC WITH DIFFERENTIAL/PLATELET
BASOS ABS: 0 10*3/uL (ref 0.0–0.1)
Basophils Relative: 0 %
EOS PCT: 0 %
Eosinophils Absolute: 0 10*3/uL (ref 0.0–0.7)
HEMATOCRIT: 27.9 % — AB (ref 36.0–46.0)
Hemoglobin: 9.2 g/dL — ABNORMAL LOW (ref 12.0–15.0)
LYMPHS PCT: 10 %
Lymphs Abs: 0.4 10*3/uL — ABNORMAL LOW (ref 0.7–4.0)
MCH: 29.3 pg (ref 26.0–34.0)
MCHC: 33 g/dL (ref 30.0–36.0)
MCV: 88.9 fL (ref 78.0–100.0)
MONOS PCT: 6 %
Monocytes Absolute: 0.2 10*3/uL (ref 0.1–1.0)
NEUTROS PCT: 84 %
Neutro Abs: 3.1 10*3/uL (ref 1.7–7.7)
Platelets: 32 10*3/uL — ABNORMAL LOW (ref 150–400)
RBC: 3.14 MIL/uL — AB (ref 3.87–5.11)
RDW: 17.6 % — AB (ref 11.5–15.5)
WBC: 3.7 10*3/uL — ABNORMAL LOW (ref 4.0–10.5)

## 2017-10-14 LAB — COMPREHENSIVE METABOLIC PANEL
ALK PHOS: 99 U/L (ref 38–126)
ALT: 30 U/L (ref 0–44)
ANION GAP: 9 (ref 5–15)
AST: 20 U/L (ref 15–41)
Albumin: 2.4 g/dL — ABNORMAL LOW (ref 3.5–5.0)
BILIRUBIN TOTAL: 0.5 mg/dL (ref 0.3–1.2)
BUN: 73 mg/dL — ABNORMAL HIGH (ref 8–23)
CALCIUM: 7.4 mg/dL — AB (ref 8.9–10.3)
CO2: 14 mmol/L — AB (ref 22–32)
CREATININE: 3.31 mg/dL — AB (ref 0.44–1.00)
Chloride: 121 mmol/L — ABNORMAL HIGH (ref 98–111)
GFR calc non Af Amer: 13 mL/min — ABNORMAL LOW (ref >60)
GFR, EST AFRICAN AMERICAN: 16 mL/min — AB (ref >60)
Glucose, Bld: 175 mg/dL — ABNORMAL HIGH (ref 70–99)
Potassium: 5 mmol/L (ref 3.5–5.1)
Sodium: 144 mmol/L (ref 135–145)
Total Protein: 5.2 g/dL — ABNORMAL LOW (ref 6.5–8.1)

## 2017-10-14 LAB — PREPARE PLATELET PHERESIS: UNIT DIVISION: 0

## 2017-10-14 LAB — BPAM PLATELET PHERESIS
BLOOD PRODUCT EXPIRATION DATE: 201907202359
ISSUE DATE / TIME: 201907191205
UNIT TYPE AND RH: 6200

## 2017-10-14 LAB — MAGNESIUM: Magnesium: 2.1 mg/dL (ref 1.7–2.4)

## 2017-10-14 MED ORDER — DEXAMETHASONE 4 MG PO TABS
40.0000 mg | ORAL_TABLET | Freq: Once | ORAL | Status: AC
  Administered 2017-10-14: 40 mg via ORAL
  Filled 2017-10-14: qty 10

## 2017-10-14 MED ORDER — POTASSIUM CHLORIDE 2 MEQ/ML IV SOLN
Freq: Once | INTRAVENOUS | Status: AC
  Administered 2017-10-14: 12:00:00 via INTRAVENOUS
  Filled 2017-10-14: qty 10

## 2017-10-14 MED ORDER — ETOPOSIDE CHEMO INJECTION 500 MG/25ML
Freq: Once | INTRAVENOUS | Status: AC
  Administered 2017-10-14: 420 mg via INTRAVENOUS
  Filled 2017-10-14: qty 7

## 2017-10-14 MED ORDER — FUROSEMIDE 10 MG/ML IJ SOLN
40.0000 mg | Freq: Once | INTRAMUSCULAR | Status: AC
  Administered 2017-10-14: 40 mg via INTRAVENOUS
  Filled 2017-10-14: qty 4

## 2017-10-14 MED ORDER — PALONOSETRON HCL INJECTION 0.25 MG/5ML
0.2500 mg | Freq: Once | INTRAVENOUS | Status: AC
  Administered 2017-10-14: 0.25 mg via INTRAVENOUS
  Filled 2017-10-14: qty 5

## 2017-10-14 MED ORDER — IPRATROPIUM-ALBUTEROL 0.5-2.5 (3) MG/3ML IN SOLN
3.0000 mL | Freq: Four times a day (QID) | RESPIRATORY_TRACT | Status: DC
  Filled 2017-10-14: qty 3

## 2017-10-14 MED ORDER — IPRATROPIUM-ALBUTEROL 0.5-2.5 (3) MG/3ML IN SOLN: 3.0000 mL | Freq: Four times a day (QID) | RESPIRATORY_TRACT | Status: DC | PRN

## 2017-10-14 MED ORDER — SODIUM CHLORIDE 0.9 % IV SOLN
7.5000 mg/m2 | Freq: Once | INTRAVENOUS | Status: AC
  Administered 2017-10-14: 10 mg via INTRAVENOUS
  Filled 2017-10-14: qty 5

## 2017-10-14 NOTE — Progress Notes (Signed)
So far, she seems to be tolerating treatment fairly well.  She is eating.  There is no nausea.  The one issue might be volume overload.  She is over fourth liters and since admission.  She is having some shortness of breath.  She is on supplemental oxygen right now.  As such, we somehow need to cut back on her maintenance IV fluids.  Her renal function is doing better.  Creatinine is 3.3 now.  I will get a chest x-ray on her to see if there is some volume overload.  We will give her some Lasix.  She did get some platelets.  Possibly, some shortness of breath may be from some reaction to the platelets.  Her potassium is 5.0.  Her sodium is 175.  Her BUN is 73.  This might be elevated because of the steroids that she is on.  Her platelet count is 32,000.  Hopefully she will not need platelets for a while.  She has had no fever.  Her physical exam shows vital signs of a temperature of 98.  Pulse 82.  Blood pressure 142/75.  Oxygen saturation is 99%.  Her lungs do show some wheezes and rhonchi bilaterally.  She has some decent air movement bilaterally.  Cardiac exam regular rate and rhythm.  Abdomen is soft.  Extremities shows no clubbing, cyanosis or edema.  Ms. Mazzoni has refractory light chain myeloma.  She is on VD-PACE.  She is doing well so far.  Again we need to get her IV fluids cut back.  Hopefully, pharmacy can help with this.  She has a good urine output.  Her creatinine is doing better so I do not think we need a lot of IV fluids right now.  We will see what the chest x-ray shows.  I do not think this is anything with infection.  I appreciate everyone's help up on 6 [address].  Everybody is doing a great job in trying to care for Jeanne Martinez.  Lattie Haw, MD  Exodus 23:25

## 2017-10-15 ENCOUNTER — Inpatient Hospital Stay (HOSPITAL_COMMUNITY): Payer: Medicare Other

## 2017-10-15 LAB — CBC WITH DIFFERENTIAL/PLATELET
BASOS ABS: 0 10*3/uL (ref 0.0–0.1)
Basophils Relative: 0 %
Eosinophils Absolute: 0 10*3/uL (ref 0.0–0.7)
Eosinophils Relative: 0 %
HEMATOCRIT: 26.2 % — AB (ref 36.0–46.0)
HEMOGLOBIN: 8.7 g/dL — AB (ref 12.0–15.0)
Lymphocytes Relative: 9 %
Lymphs Abs: 0.2 10*3/uL — ABNORMAL LOW (ref 0.7–4.0)
MCH: 29.5 pg (ref 26.0–34.0)
MCHC: 33.2 g/dL (ref 30.0–36.0)
MCV: 88.8 fL (ref 78.0–100.0)
Monocytes Absolute: 0.1 10*3/uL (ref 0.1–1.0)
Monocytes Relative: 3 %
NEUTROS ABS: 2.1 10*3/uL (ref 1.7–7.7)
Neutrophils Relative %: 88 %
PLATELETS: 19 10*3/uL — AB (ref 150–400)
RBC: 2.95 MIL/uL — AB (ref 3.87–5.11)
RDW: 17.9 % — ABNORMAL HIGH (ref 11.5–15.5)
WBC: 2.4 10*3/uL — AB (ref 4.0–10.5)

## 2017-10-15 LAB — COMPREHENSIVE METABOLIC PANEL
ALT: 22 U/L (ref 0–44)
AST: 16 U/L (ref 15–41)
Albumin: 2.4 g/dL — ABNORMAL LOW (ref 3.5–5.0)
Alkaline Phosphatase: 78 U/L (ref 38–126)
Anion gap: 9 (ref 5–15)
BILIRUBIN TOTAL: 0.1 mg/dL — AB (ref 0.3–1.2)
BUN: 69 mg/dL — AB (ref 8–23)
CHLORIDE: 123 mmol/L — AB (ref 98–111)
CO2: 13 mmol/L — ABNORMAL LOW (ref 22–32)
CREATININE: 2.77 mg/dL — AB (ref 0.44–1.00)
Calcium: 7 mg/dL — ABNORMAL LOW (ref 8.9–10.3)
GFR calc Af Amer: 19 mL/min — ABNORMAL LOW (ref >60)
GFR, EST NON AFRICAN AMERICAN: 17 mL/min — AB (ref >60)
Glucose, Bld: 163 mg/dL — ABNORMAL HIGH (ref 70–99)
Potassium: 5.4 mmol/L — ABNORMAL HIGH (ref 3.5–5.1)
Sodium: 145 mmol/L (ref 135–145)
Total Protein: 4.9 g/dL — ABNORMAL LOW (ref 6.5–8.1)

## 2017-10-15 LAB — MAGNESIUM: MAGNESIUM: 2.3 mg/dL (ref 1.7–2.4)

## 2017-10-15 MED ORDER — SODIUM CHLORIDE 0.9 % IV SOLN
7.5000 mg/m2 | Freq: Once | INTRAVENOUS | Status: AC
  Administered 2017-10-15: 10 mg via INTRAVENOUS
  Filled 2017-10-15: qty 5

## 2017-10-15 MED ORDER — SODIUM CHLORIDE 0.9 % IV SOLN
Freq: Once | INTRAVENOUS | Status: AC
  Administered 2017-10-15: 420 mg via INTRAVENOUS
  Filled 2017-10-15: qty 7

## 2017-10-15 MED ORDER — BORTEZOMIB CHEMO IV INJECTION 3.5 MG(1MG/ML)
1.3000 mg/m2 | Freq: Once | INTRAMUSCULAR | Status: AC
  Administered 2017-10-15: 1.8 mg via INTRAVENOUS
  Filled 2017-10-15: qty 1.8

## 2017-10-15 MED ORDER — DEXTROSE-NACL 5-0.45 % IV SOLN
Freq: Once | INTRAVENOUS | Status: AC
  Administered 2017-10-15: 12:00:00 via INTRAVENOUS
  Filled 2017-10-15: qty 2.96

## 2017-10-15 MED ORDER — DEXAMETHASONE 4 MG PO TABS
40.0000 mg | ORAL_TABLET | Freq: Once | ORAL | Status: AC
  Administered 2017-10-15: 40 mg via ORAL
  Filled 2017-10-15: qty 10

## 2017-10-15 MED ORDER — SODIUM BICARBONATE/SODIUM CHLORIDE MOUTHWASH: OROMUCOSAL | Status: DC

## 2017-10-15 MED ORDER — BIOTENE DRY MOUTH MT LIQD: 15.0000 mL | OROMUCOSAL | Status: DC

## 2017-10-15 NOTE — Progress Notes (Signed)
CRITICAL VALUE ALERT  Critical Value: Plt 19 Date & Time Notied: 7/21 4403 Provider Notified: Dr Marin Olp [id number]  Orders Received/Actions taken: no nre orders

## 2017-10-15 NOTE — Progress Notes (Signed)
Jeanne Martinez is still doing fairly well with treatment.  She had a phenomenal urine output yesterday with Lasix.  Her chest x-ray did show some volume overload.  I will recheck a chest x-ray today.  Her creatinine continues to improve.  Today it is now 2.77.  Her potassium however is 5.4.  We need to stop the potassium in her IV fluids.  Pharmacy will help [place] with this.  She is not eating that much.  She really needs to eat a little bit better.  Her husband will try to help with this.  There is been no bleeding.  She has had no fever.  She has had no rashes.  Her white cell count is 2.4.  Hemoglobin 8.7.  Platelets are pending.  Her sodium is 145.  There is no pain.  She is had no obvious nausea.  She is had no diarrhea.  Her vital signs show temperature of 97.7.  Pulse 79.  Blood pressure 139/78.  Head neck exam shows no ocular or oral lesions.  She has no mucositis.  I do need to make sure she is rinsing her mouth out.  I think she needs Biotene and sodium bicarb mouth rinses.  Her lungs sound better.  She has better breath sounds bilaterally.  I do not hear any wheezes.  Cardiac exam regular rate and rhythm.  Abdomen is soft.  Extremities shows no clubbing, cyanosis or edema.  Neurological exam is nonfocal.  Skin exam shows no rashes or petechia.  Jeanne Martinez has refractory myeloma.  She is on VD-PACE.  She we will get her last infusional bag today.  Hopefully, I would suspect that we will be able to get her home on Tuesday.  We will see what her chest x-ray looks like.  We have to watch her intake and output.  We will have to watch her potassium.  She must use the incentive spirometer.  I saw her use it this morning.  I think she actually did pretty well.  I very much appreciate the fantastic care that she is getting from everybody up on 6 [facility].  Lattie Haw, MD  Psalm 46:1

## 2017-10-16 LAB — COMPREHENSIVE METABOLIC PANEL
ALBUMIN: 2.2 g/dL — AB (ref 3.5–5.0)
ALT: 20 U/L (ref 0–44)
ANION GAP: 9 (ref 5–15)
AST: 16 U/L (ref 15–41)
Alkaline Phosphatase: 62 U/L (ref 38–126)
BILIRUBIN TOTAL: 0.4 mg/dL (ref 0.3–1.2)
BUN: 67 mg/dL — ABNORMAL HIGH (ref 8–23)
CO2: 14 mmol/L — ABNORMAL LOW (ref 22–32)
Calcium: 6.8 mg/dL — ABNORMAL LOW (ref 8.9–10.3)
Chloride: 125 mmol/L — ABNORMAL HIGH (ref 98–111)
Creatinine, Ser: 2.48 mg/dL — ABNORMAL HIGH (ref 0.44–1.00)
GFR, EST AFRICAN AMERICAN: 22 mL/min — AB (ref >60)
GFR, EST NON AFRICAN AMERICAN: 19 mL/min — AB (ref >60)
GLUCOSE: 105 mg/dL — AB (ref 70–99)
POTASSIUM: 4.9 mmol/L (ref 3.5–5.1)
Sodium: 148 mmol/L — ABNORMAL HIGH (ref 135–145)
TOTAL PROTEIN: 4.5 g/dL — AB (ref 6.5–8.1)

## 2017-10-16 LAB — CBC WITH DIFFERENTIAL/PLATELET
BASOS ABS: 0 10*3/uL (ref 0.0–0.1)
Basophils Relative: 0 %
Eosinophils Absolute: 0 10*3/uL (ref 0.0–0.7)
Eosinophils Relative: 0 %
HEMATOCRIT: 23.5 % — AB (ref 36.0–46.0)
Hemoglobin: 7.9 g/dL — ABNORMAL LOW (ref 12.0–15.0)
LYMPHS PCT: 13 %
Lymphs Abs: 0.2 10*3/uL — ABNORMAL LOW (ref 0.7–4.0)
MCH: 29.5 pg (ref 26.0–34.0)
MCHC: 33.6 g/dL (ref 30.0–36.0)
MCV: 87.7 fL (ref 78.0–100.0)
MONO ABS: 0 10*3/uL — AB (ref 0.1–1.0)
Monocytes Relative: 1 %
Neutro Abs: 1 10*3/uL — ABNORMAL LOW (ref 1.7–7.7)
Neutrophils Relative %: 86 %
Platelets: 10 10*3/uL — CL (ref 150–400)
RBC: 2.68 MIL/uL — ABNORMAL LOW (ref 3.87–5.11)
RDW: 18 % — AB (ref 11.5–15.5)
WBC: 1.2 10*3/uL — CL (ref 4.0–10.5)

## 2017-10-16 LAB — MAGNESIUM: MAGNESIUM: 2.3 mg/dL (ref 1.7–2.4)

## 2017-10-16 LAB — TYPE AND SCREEN
ABO/RH(D): A POS
ANTIBODY SCREEN: POSITIVE

## 2017-10-16 MED ORDER — SODIUM CHLORIDE 0.9% IV SOLUTION
Freq: Once | INTRAVENOUS | Status: AC
  Administered 2017-10-16: 16:00:00 via INTRAVENOUS

## 2017-10-16 MED ORDER — TBO-FILGRASTIM 480 MCG/0.8ML ~~LOC~~ SOSY
480.0000 ug | PREFILLED_SYRINGE | Freq: Every day | SUBCUTANEOUS | Status: DC
  Administered 2017-10-16: 480 ug via SUBCUTANEOUS
  Filled 2017-10-16: qty 0.8

## 2017-10-16 MED ORDER — SODIUM CHLORIDE 0.9 % IV SOLN
40.0000 mg | Freq: Once | INTRAVENOUS | Status: AC | PRN
  Administered 2017-10-16: 40 mg via INTRAVENOUS
  Filled 2017-10-16: qty 4

## 2017-10-16 MED ORDER — METHYLPREDNISOLONE SODIUM SUCC 125 MG IJ SOLR
80.0000 mg | Freq: Once | INTRAMUSCULAR | Status: AC
  Administered 2017-10-16: 80 mg via INTRAVENOUS
  Filled 2017-10-16: qty 2

## 2017-10-16 MED ORDER — FUROSEMIDE 10 MG/ML IJ SOLN
40.0000 mg | Freq: Once | INTRAMUSCULAR | Status: AC
  Administered 2017-10-16: 40 mg via INTRAVENOUS
  Filled 2017-10-16: qty 4

## 2017-10-16 NOTE — Evaluation (Signed)
Occupational Therapy Evaluation Patient Details Name: Jeanne Martinez MRN: [id number] DOB: [birth year] Today's Date: 10/16/2017    History of Present Illness 67 yo female admitted with multiple myeloma, low platelets. Hx of multiple myeloma   Clinical Impression   Pt was admitted for the above.  Her platelets are 10 today but family wanted therapy to work with pt before transfusion.  Pt is mostly min guard for safety when walking and set up/supervision for adls.  She will benefit from reinforcement of energy conservation and may want to consider some kind of tub seat/bench for both safety and energy conservation.       Follow Up Recommendations  Home health OT(if another [facility] service is involved--OT cannot stand alone)    Equipment Recommendations  (Pt may want to consider tub seat or bench)    Recommendations for Other Services       Precautions / Restrictions Precautions Precautions: Fall Restrictions Weight Bearing Restrictions: No      Mobility Bed Mobility               General bed mobility comments: oob in recliner  Transfers Overall transfer level: Needs assistance Equipment used: None Transfers: Sit to/from Stand Sit to Stand: Modified independent (Device/Increase time)              Balance Overall balance assessment: Mild deficits observed, not formally tested                                         ADL either performed or assessed with clinical judgement   ADL Overall ADL's : Needs assistance/impaired Eating/Feeding: Independent   Grooming: Supervision/safety;Standing   Upper Body Bathing: Set up;Sitting   Lower Body Bathing: Supervison/ safety;Sit to/from stand   Upper Body Dressing : Set up;Sitting   Lower Body Dressing: Supervision/safety;Sit to/from stand   Toilet Transfer: Min guard;Ambulation;Comfort height toilet;RW   Toileting- Clothing Manipulation and Hygiene: Supervision/safety;Sit to/from stand          General ADL Comments: educated on energy conservation and general safety.  Pt has a tub shower without a seat. She would benefit from either seat or bench for energy conservation as well as safety.  Handouts given.  Husband ambulates independently; unsure how much help he can give her. Pt reports son and daughter are each about 15 min. Away and check often and bring food. She doesn't do a lot of cleaning     Vision         Perception     Praxis      Pertinent Vitals/Pain Pain Assessment: No/denies pain     Hand Dominance     Extremity/Trunk Assessment Upper Extremity Assessment Upper Extremity Assessment: Overall WFL for tasks assessed   Lower Extremity Assessment Lower Extremity Assessment: Overall WFL for tasks assessed   Cervical / Trunk Assessment Cervical / Trunk Assessment: Normal   Communication Communication Communication: No difficulties   Cognition Arousal/Alertness: Awake/alert Behavior During Therapy: WFL for tasks assessed/performed Overall Cognitive Status: Within Functional Limits for tasks assessed                                 General Comments: ? memory, pt asking husband how she got bruised on RLE.  Pt initially providing home info about her house in [place]. They have a townhouse they are staying in  here.     General Comments       Exercises     Shoulder Instructions      Home Living Family/patient expects to be discharged to:: Private residence Living Arrangements: Spouse/significant other Available Help at Discharge: Family               Bathroom Shower/Tub: Teacher, early years/pre: Standard     Home Equipment: Environmental consultant - 4 wheels;Cane - single point          Prior Functioning/Environment Level of Independence: Independent                 OT Problem List: Decreased strength;Impaired balance (sitting and/or standing);Decreased activity tolerance      OT Treatment/Interventions: Self-care/ADL  training;Energy conservation;DME and/or AE instruction;Balance training;Patient/family education    OT Goals(Current goals can be found in the care plan section) Acute Rehab OT Goals Patient Stated Goal: none stated OT Goal Formulation: With patient Time For Goal Achievement: 10/30/17 Potential to Achieve Goals: Good ADL Goals Pt Will Perform Tub/Shower Transfer: Tub transfer;with min guard assist;ambulating;shower seat;tub bench(vs verbalizing DME options) Additional ADL Goal #1: Pt will verbalize 3 energy conservation strategies  OT Frequency: Min 2X/week   Barriers to D/C:            Co-evaluation              AM-PAC PT "6 Clicks" Daily Activity     Outcome Measure Help from another person eating meals?: None Help from another person taking care of personal grooming?: A Little Help from another person toileting, which includes using toliet, bedpan, or urinal?: A Little Help from another person bathing (including washing, rinsing, drying)?: A Little Help from another person to put on and taking off regular upper body clothing?: A Little Help from another person to put on and taking off regular lower body clothing?: A Little 6 Click Score: 19   End of Session    Activity Tolerance: Patient tolerated treatment well Patient left: in chair;with call bell/phone within reach;with family/visitor present  OT Visit Diagnosis: Muscle weakness (generalized) (M62.81)                Time: 9702-6378 OT Time Calculation (min): 24 min Charges:  OT General Charges $OT Visit: 1 Visit OT Evaluation $OT Eval Low Complexity: 1 Low OT Treatments $Self Care/Home Management : 8-22 mins G-Codes:     [place], OTR/L [phone] 10/16/2017  Jeanne Martinez 10/16/2017, 12:36 PM

## 2017-10-16 NOTE — Progress Notes (Signed)
[name] is near the end of her chemotherapy infusion.  She will then get Cytoxan.  She is sitting up in a chair.  Apparently there is some issues yesterday.  She has some type of "wick" placed for her urine.  Apparently this was not all that excepted.  I am unsure if it really was not working.  She also was told she cannot have flowers in the room.  It is okay for her to have flowers.  She does not need neutropenic precautions right now.  Her kidney function continues to improve.  Her kidney function shows a creatinine of 2.48.  Her [place] is 1.2.  Hemoglobin 7.9.  Platelet count 10,000.  We will go ahead and transfuse her some platelets today.  Her chest x-ray yesterday looked much better.  She had some volume overload.  I still had to give her some Lasix I think.  She still is on the "plus side" by 2400 cc of fluid.    Her daughter says that she is eating a little bit better.  We must get her walking more.  I just do not want to see her get deconditioned.  Hopefully, she will be able to go home in 1-2 days.  Her vital signs show temperature 97.5.  Pulse 79.  Blood pressure 129/87.  Her O2 saturations 100%.  Her lungs are clear bilaterally.  No wheezes are noted.  Cardiac exam regular rate and rhythm.  There are no murmurs.  Abdomen is soft.  Bowel sounds are present.  Extremities shows no clubbing, cyanosis or edema.  Again, we have to be careful with volume overload.  I will give her some Lasix today.  She we will start Neupogen also.  I had believe that her light chains are much better.  Her renal function has improved tremendously.  She is still somewhat "delicate".  We had to be cautious and be vigilant with her.  She will get platelets today.  I ordered premeds with Pepcid and Solu-Medrol.  I appreciate the care that she is getting from the staff up on 6 [address].  Everybody is doing a Chief Technology Officer job.  Lattie Haw. MD  Psalm 91:1-2

## 2017-10-16 NOTE — Care Management Important Message (Signed)
Important Message  Patient Details  Name: ZAMORIA BOSS MRN: [id number] Date of Birth: May 23, 1950   Medicare Important Message Given:  Yes    Kerin Salen 10/16/2017, 11:00 AMImportant Message  Patient Details  Name: KEMORA PINARD MRN: [id number] Date of Birth: [birth year]   Medicare Important Message Given:  Yes    Kerin Salen 10/16/2017, 11:00 AM

## 2017-10-16 NOTE — Progress Notes (Signed)
OT Cancellation Note  Patient Details Name: ZAIDA REILAND MRN: [id number] DOB: 02/13/1951   Cancelled Treatment:    Reason Eval/Treat Not Completed: Medical issues which prohibited therapy.  Noted pt is to get platelets today. Will check back.  Pietra Zuluaga 10/16/2017, 7:56 AM  Lesle Chris, OTR/L [phone] 10/16/2017

## 2017-10-16 NOTE — Evaluation (Signed)
Physical Therapy Evaluation Patient Details Name: NYAJAH HYSON MRN: [id number] DOB: [birth year] Today's Date: 10/16/2017   History of Present Illness  67 yo female admitted with multiple myeloma, low platelets. Hx of multiple myeloma  Clinical Impression  On eval, pt was Min guard assist for mobility. She walked ~200 feet in the hallway without a device. Mild unsteadiness intermittently but no LOB. Dyspnea 3/4. At end of session, O2 sat 94% on RA. Replaced [place] O2 to aid in recovery since pt was dyspneic. Will follow during hospital stay. Do not anticipate any f/u PT needs at discharge. Recommend daily ambulation in hallway with nursing supervision.     Follow Up Recommendations Supervision for mobility/OOB    Equipment Recommendations  None recommended by PT    Recommendations for Other Services       Precautions / Restrictions Precautions Precautions: Fall Restrictions Weight Bearing Restrictions: No      Mobility  Bed Mobility               General bed mobility comments: oob in recliner  Transfers Overall transfer level: Needs assistance   Transfers: Sit to/from Stand Sit to Stand: Modified independent (Device/Increase time)            Ambulation/Gait Ambulation/Gait assistance: Min guard Gait Distance (Feet): 200 Feet Assistive device: None Gait Pattern/deviations: Step-through pattern;Decreased stride length     General Gait Details: fair gait speed. Mildly unsteady but no LOB. Dyspnea 3/4.   Stairs            Wheelchair Mobility    Modified Rankin (Stroke Patients Only)       Balance Overall balance assessment: Mild deficits observed, not formally tested                                           Pertinent Vitals/Pain Pain Assessment: No/denies pain    Home Living Family/patient expects to be discharged to:: Private residence Living Arrangements: Spouse/significant other Available Help at Discharge: Family            Home Equipment: Gilford Rile - 4 wheels;Cane - single point      Prior Function Level of Independence: Independent               Hand Dominance        Extremity/Trunk Assessment   Upper Extremity Assessment Upper Extremity Assessment: Overall WFL for tasks assessed    Lower Extremity Assessment Lower Extremity Assessment: Overall WFL for tasks assessed    Cervical / Trunk Assessment Cervical / Trunk Assessment: Normal  Communication   Communication: No difficulties  Cognition Arousal/Alertness: Awake/alert Behavior During Therapy: WFL for tasks assessed/performed Overall Cognitive Status: Within Functional Limits for tasks assessed                                        General Comments      Exercises     Assessment/Plan    PT Assessment Patient needs continued PT services  PT Problem List Decreased mobility;Decreased activity tolerance       PT Treatment Interventions Gait training;Functional mobility training;Therapeutic activities;Patient/family education;Therapeutic exercise    PT Goals (Current goals can be found in the Care Plan section)  Acute Rehab PT Goals Patient Stated Goal: none stated PT Goal Formulation: With patient/family Time For  Goal Achievement: 10/30/17 Potential to Achieve Goals: Good    Frequency Min 3X/week   Barriers to discharge        Co-evaluation               AM-PAC PT "6 Clicks" Daily Activity  Outcome Measure Difficulty turning over in bed (including adjusting bedclothes, sheets and blankets)?: None Difficulty moving from lying on back to sitting on the side of the bed? : None Difficulty sitting down on and standing up from a chair with arms (e.g., wheelchair, bedside commode, etc,.)?: None Help needed moving to and from a bed to chair (including a wheelchair)?: None Help needed walking in hospital room?: A Little Help needed climbing 3-5 steps with a railing? : A Little 6 Click  Score: 22    End of Session Equipment Utilized During Treatment: Gait belt Activity Tolerance: Patient tolerated treatment well Patient left: in chair;with call bell/phone within reach;with chair alarm set;with family/visitor present   PT Visit Diagnosis: Muscle weakness (generalized) (M62.81);Difficulty in walking, not elsewhere classified (R26.2)    Time: 2244-9753 PT Time Calculation (min) (ACUTE ONLY): 11 min   Charges:   PT Evaluation $PT Eval Moderate Complexity: 1 Mod     PT G Codes:          Weston Anna, MPT Pager: [phone]

## 2017-10-16 NOTE — Progress Notes (Signed)
CRITICAL VALUE ALERT  Critical Value: WBC 1.2  Date & Time Notied:  10/16/2017   0620  Provider Notified:  Dr Marin Olp  Orders Received/Actions taken: No orders at this time per Dr Marin Olp

## 2017-10-17 ENCOUNTER — Telehealth: Payer: Self-pay | Admitting: Hematology & Oncology

## 2017-10-17 LAB — PREPARE PLATELET PHERESIS: Unit division: 0

## 2017-10-17 LAB — COMPREHENSIVE METABOLIC PANEL
ALT: 26 U/L (ref 0–44)
ANION GAP: 11 (ref 5–15)
AST: 24 U/L (ref 15–41)
Albumin: 2.5 g/dL — ABNORMAL LOW (ref 3.5–5.0)
Alkaline Phosphatase: 72 U/L (ref 38–126)
BILIRUBIN TOTAL: 0.5 mg/dL (ref 0.3–1.2)
BUN: 70 mg/dL — AB (ref 8–23)
CHLORIDE: 121 mmol/L — AB (ref 98–111)
CO2: 15 mmol/L — ABNORMAL LOW (ref 22–32)
Calcium: 7.2 mg/dL — ABNORMAL LOW (ref 8.9–10.3)
Creatinine, Ser: 2.31 mg/dL — ABNORMAL HIGH (ref 0.44–1.00)
GFR, EST AFRICAN AMERICAN: 24 mL/min — AB (ref >60)
GFR, EST NON AFRICAN AMERICAN: 21 mL/min — AB (ref >60)
Glucose, Bld: 94 mg/dL (ref 70–99)
POTASSIUM: 4.6 mmol/L (ref 3.5–5.1)
Sodium: 147 mmol/L — ABNORMAL HIGH (ref 135–145)
TOTAL PROTEIN: 4.9 g/dL — AB (ref 6.5–8.1)

## 2017-10-17 LAB — CBC WITH DIFFERENTIAL/PLATELET
Basophils Absolute: 0 10*3/uL (ref 0.0–0.1)
Basophils Relative: 0 %
EOS PCT: 0 %
Eosinophils Absolute: 0 10*3/uL (ref 0.0–0.7)
HEMATOCRIT: 22.4 % — AB (ref 36.0–46.0)
Hemoglobin: 7.7 g/dL — ABNORMAL LOW (ref 12.0–15.0)
LYMPHS ABS: 0.2 10*3/uL — AB (ref 0.7–4.0)
Lymphocytes Relative: 20 %
MCH: 29.7 pg (ref 26.0–34.0)
MCHC: 34.4 g/dL (ref 30.0–36.0)
MCV: 86.5 fL (ref 78.0–100.0)
MONO ABS: 0 10*3/uL — AB (ref 0.1–1.0)
MONOS PCT: 1 %
NEUTROS ABS: 0.6 10*3/uL — AB (ref 1.7–7.7)
Neutrophils Relative %: 79 %
PLATELETS: 35 10*3/uL — AB (ref 150–400)
RBC: 2.59 MIL/uL — ABNORMAL LOW (ref 3.87–5.11)
RDW: 17.9 % — AB (ref 11.5–15.5)
WBC: 0.8 10*3/uL — AB (ref 4.0–10.5)

## 2017-10-17 LAB — MAGNESIUM: MAGNESIUM: 2.2 mg/dL (ref 1.7–2.4)

## 2017-10-17 LAB — BPAM PLATELET PHERESIS
Blood Product Expiration Date: 201907222359
ISSUE DATE / TIME: 201907221525
Unit Type and Rh: 6200

## 2017-10-17 MED ORDER — ADULT MULTIVITAMIN W/MINERALS CH: 1.0000 | ORAL_TABLET | Freq: Every day | ORAL | Status: DC

## 2017-10-17 MED ORDER — LEVOFLOXACIN 250 MG PO TABS: 250.0000 mg | ORAL_TABLET | Freq: Every day | ORAL | 5 refills | Status: DC

## 2017-10-17 MED ORDER — LEVOFLOXACIN 500 MG PO TABS
250.0000 mg | ORAL_TABLET | Freq: Every day | ORAL | Status: DC
  Administered 2017-10-17: 250 mg via ORAL
  Filled 2017-10-17: qty 1

## 2017-10-17 MED ORDER — BIOTENE DRY MOUTH MT LIQD: 15.0000 mL | OROMUCOSAL | 4 refills | Status: AC

## 2017-10-17 MED ORDER — FLUCONAZOLE 100 MG PO TABS: 100.0000 mg | ORAL_TABLET | Freq: Every day | ORAL | 3 refills | Status: DC

## 2017-10-17 MED ORDER — FLUCONAZOLE 100 MG PO TABS
100.0000 mg | ORAL_TABLET | Freq: Every day | ORAL | Status: DC
  Administered 2017-10-17: 100 mg via ORAL
  Filled 2017-10-17: qty 1

## 2017-10-17 NOTE — Discharge Summary (Signed)
This is a discharge summary for [employer].  She was admitted on July 16.  She was discharged on July 23.  Her diagnosis upon discharge is:  1.  Refractory myeloma 2.  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  Pancytopenia secondary to myeloma 4.  Volume overload secondary to IV fluids and chemotherapy 5.  Status post second morning of chemotherapy with VD-PACE  Diagnosis upon discharge: Stable  Diet upon discharge: Regular  Follow-up upon discharge: Patient will come to Dr. Antonieta Pert office on July 24 for labs and [facility] course: Ms. Douglass was admitted on 16 July.  She came in for chemotherapy.  She had a Port-A-Cath already placed.  She had echocardiogram which showed an ejection fraction of 55-60%..  She had a renal insufficiency.  We had to dose reduce her chemotherapy accordingly.  She was started on VD PACE on the 26th.  She actually did incredibly well with the chemotherapy.  She had no nausea or vomiting.  She required blood transfusion because of the pancytopenia secondary to her myeloma.  Surprisingly, her kidney function improved dramatically.  When she was admitted, her creatinine was 4.8.  She was discharged, her creatinine was 2.3.  She did have an episode of volume overload.  On the 20th, her chest x-ray shows some volume overload.  We gave her some IV Lasix.  This worked incredibly well.  She had about 4000 cc of urine as an output.  We had pharmacy adjust her volume of fluid for chemotherapy.  She had a follow-up chest x-ray the next day, this showed marked improvement of her pulmonary edema.  She completed her chemotherapy on the 22nd.  We actually started her on Neupogen on the 21st as her white cell count is already going down.  She was out of bed.  She was eating okay.  She had one episode of diarrhea.  She had no fever.  She was already on Famvir.  I started her on Levaquin and Diflucan as empiric coverage.  On the day of discharge, her labs  showed a sodium of 147.  Potassium 4.6.  Creatinine 2.3.  Her BUN was 70.  Her calcium was 7.2.  Her white cell count 0.8.  Hemoglobin 7.7.  Platelet count 35,000.  On her physical exam, her vital signs are temperature 97.7.  Pulse 68.  Blood pressure 139/77.  Her head neck exam shows no mucositis.  She was on Biotene and sodium bicarb mouth rinses.  Her lungs were clear bilaterally.  Cardiac exam regular rate and rhythm.  Abdomen is soft.  She had good bowel sounds.  There is no guarding or rebound tenderness.  Extremities shows no clubbing, cyanosis or edema.  Neurological exam shows no focal neurological deficits.  Skin exam shows no rashes, ecchymoses or petechia.  She has some slight short-term memory loss.  Her son was with her.  Her son understood the discharge instructions with the antibiotics and follow-up.  This is a discharge summary for [employer].  Lattie Haw, MD  Oswaldo Milian 54:17

## 2017-10-17 NOTE — Progress Notes (Signed)
OT Note:  Pt for d/c today. Brought pictures of tub seat vs bench options.  Pt would be able to access seat. Daughter present and can assist for first couple of times as needed.  [place], [place] [phone] 10/17/2017

## 2017-10-17 NOTE — Telephone Encounter (Signed)
Unable to reach pt to inform of lab/inj appt 7/23 at 0845 am. Voicemail was full

## 2017-10-18 ENCOUNTER — Inpatient Hospital Stay: Payer: Medicare Other

## 2017-10-18 ENCOUNTER — Telehealth: Payer: Self-pay | Admitting: *Deleted

## 2017-10-18 ENCOUNTER — Other Ambulatory Visit: Payer: Self-pay

## 2017-10-18 VITALS — BP 125/68 | HR 85 | Temp 97.5°F

## 2017-10-18 DIAGNOSIS — Z95828 Presence of other vascular implants and grafts: Secondary | ICD-10-CM

## 2017-10-18 DIAGNOSIS — D649 Anemia, unspecified: Secondary | ICD-10-CM | POA: Diagnosis not present

## 2017-10-18 DIAGNOSIS — N179 Acute kidney failure, unspecified: Secondary | ICD-10-CM | POA: Diagnosis not present

## 2017-10-18 DIAGNOSIS — D8989 Other specified disorders involving the immune mechanism, not elsewhere classified: Secondary | ICD-10-CM

## 2017-10-18 DIAGNOSIS — C9000 Multiple myeloma not having achieved remission: Secondary | ICD-10-CM

## 2017-10-18 DIAGNOSIS — Z5189 Encounter for other specified aftercare: Secondary | ICD-10-CM | POA: Diagnosis not present

## 2017-10-18 DIAGNOSIS — C9002 Multiple myeloma in relapse: Secondary | ICD-10-CM | POA: Diagnosis present

## 2017-10-18 LAB — CBC WITH DIFFERENTIAL (CANCER CENTER ONLY)
Basophils Absolute: 0 10*3/uL (ref 0.0–0.1)
Basophils Relative: 0 %
EOS ABS: 0 10*3/uL (ref 0.0–0.5)
Eosinophils Relative: 5 %
HCT: 22.4 % — ABNORMAL LOW (ref 34.8–46.6)
Hemoglobin: 7.4 g/dL — ABNORMAL LOW (ref 11.6–15.9)
LYMPHS ABS: 0.1 10*3/uL — AB (ref 0.9–3.3)
LYMPHS PCT: 43 %
MCH: 29 pg (ref 26.0–34.0)
MCHC: 33 g/dL (ref 32.0–36.0)
MCV: 87.8 fL (ref 81.0–101.0)
Monocytes Absolute: 0 10*3/uL — ABNORMAL LOW (ref 0.1–0.9)
Monocytes Relative: 0 %
NEUTROS PCT: 52 %
Neutro Abs: 0.1 10*3/uL — CL (ref 1.5–6.5)
Platelet Count: 14 10*3/uL — ABNORMAL LOW (ref 145–400)
RBC: 2.55 MIL/uL — ABNORMAL LOW (ref 3.70–5.32)
RDW: 16.9 % — ABNORMAL HIGH (ref 11.1–15.7)
WBC Count: 0.2 10*3/uL — CL (ref 3.9–10.0)

## 2017-10-18 LAB — CMP (CANCER CENTER ONLY)
ALK PHOS: 76 U/L (ref 26–84)
ALT: 28 U/L (ref 10–47)
AST: 30 U/L (ref 11–38)
Albumin: 2.5 g/dL — ABNORMAL LOW (ref 3.5–5.0)
Anion gap: 9 (ref 5–15)
BUN: 70 mg/dL — ABNORMAL HIGH (ref 7–22)
CO2: 14 mmol/L — ABNORMAL LOW (ref 18–33)
Calcium: 7.5 mg/dL — ABNORMAL LOW (ref 8.0–10.3)
Chloride: 116 mmol/L — ABNORMAL HIGH (ref 98–108)
Creatinine: 2.1 mg/dL — ABNORMAL HIGH (ref 0.60–1.20)
Glucose, Bld: 99 mg/dL (ref 73–118)
Potassium: 4 mmol/L (ref 3.3–4.7)
SODIUM: 139 mmol/L (ref 128–145)
TOTAL PROTEIN: 5.1 g/dL — AB (ref 6.4–8.1)
Total Bilirubin: 0.5 mg/dL (ref 0.2–1.6)

## 2017-10-18 MED ORDER — SODIUM CHLORIDE 0.9% FLUSH
10.0000 mL | INTRAVENOUS | Status: DC | PRN
  Administered 2017-10-18: 10 mL via INTRAVENOUS
  Filled 2017-10-18: qty 10

## 2017-10-18 MED ORDER — HEPARIN SOD (PORK) LOCK FLUSH 100 UNIT/ML IV SOLN
500.0000 [IU] | Freq: Once | INTRAVENOUS | Status: AC
  Administered 2017-10-18: 500 [IU] via INTRAVENOUS
  Filled 2017-10-18: qty 5

## 2017-10-18 MED ORDER — LORAZEPAM 0.5 MG PO TABS
0.5000 mg | ORAL_TABLET | Freq: Four times a day (QID) | ORAL | 0 refills | Status: AC | PRN
Start: 2017-10-18 — End: ?

## 2017-10-18 MED ORDER — PROCHLORPERAZINE MALEATE 10 MG PO TABS: 10.0000 mg | ORAL_TABLET | Freq: Four times a day (QID) | ORAL | 1 refills | Status: AC | PRN

## 2017-10-18 MED ORDER — DEXAMETHASONE 4 MG PO TABS
8.0000 mg | ORAL_TABLET | Freq: Two times a day (BID) | ORAL | 1 refills | Status: DC
Start: 2017-10-18 — End: 2017-10-18

## 2017-10-18 MED ORDER — PEGFILGRASTIM-CBQV 6 MG/0.6ML ~~LOC~~ SOSY
PREFILLED_SYRINGE | SUBCUTANEOUS | Status: AC
  Filled 2017-10-18: qty 0.6

## 2017-10-18 MED ORDER — ACYCLOVIR 400 MG PO TABS: 400.0000 mg | ORAL_TABLET | Freq: Two times a day (BID) | ORAL | 11 refills | Status: DC

## 2017-10-18 MED ORDER — ONDANSETRON HCL 8 MG PO TABS: 8.0000 mg | ORAL_TABLET | Freq: Two times a day (BID) | ORAL | 1 refills | Status: AC | PRN

## 2017-10-18 MED ORDER — PEGFILGRASTIM INJECTION 6 MG/0.6ML ~~LOC~~: 6.0000 mg | PREFILLED_SYRINGE | Freq: Once | SUBCUTANEOUS | Status: DC

## 2017-10-18 MED ORDER — PEGFILGRASTIM-CBQV 6 MG/0.6ML ~~LOC~~ SOSY
6.0000 mg | PREFILLED_SYRINGE | Freq: Once | SUBCUTANEOUS | Status: AC
  Administered 2017-10-18: 6 mg via SUBCUTANEOUS

## 2017-10-18 NOTE — Patient Instructions (Signed)
Pegfilgrastim injection What is this medicine? PEGFILGRASTIM (PEG fil gra stim) is a long-acting granulocyte colony-stimulating factor that stimulates the growth of neutrophils, a type of white blood cell important in the body's fight against infection. It is used to reduce the incidence of fever and infection in patients with certain types of cancer who are receiving chemotherapy that affects the bone marrow, and to increase survival after being exposed to high doses of radiation. This medicine may be used for other purposes; ask your health care provider or pharmacist if you have questions. COMMON BRAND NAME(S): Neulasta What should I tell my health care provider before I take this medicine? They need to know if you have any of these conditions: -kidney disease -latex allergy -ongoing radiation therapy -sickle cell disease -skin reactions to acrylic adhesives (On-Body Injector only) -an unusual or allergic reaction to pegfilgrastim, filgrastim, other medicines, foods, dyes, or preservatives -pregnant or trying to get pregnant -breast-feeding How should I use this medicine? This medicine is for injection under the skin. If you get this medicine at home, you will be taught how to prepare and give the pre-filled syringe or how to use the On-body Injector. Refer to the patient Instructions for Use for detailed instructions. Use exactly as directed. Tell your healthcare provider immediately if you suspect that the On-body Injector may not have performed as intended or if you suspect the use of the On-body Injector resulted in a missed or partial dose. It is important that you put your used needles and syringes in a special sharps container. Do not put them in a trash can. If you do not have a sharps container, call your pharmacist or healthcare provider to get one. Talk to your pediatrician regarding the use of this medicine in children. While this drug may be prescribed for selected conditions,  precautions do apply. Overdosage: If you think you have taken too much of this medicine contact a poison control center or emergency room at once. NOTE: This medicine is only for you. Do not share this medicine with others. What if I miss a dose? It is important not to miss your dose. Call your doctor or health care professional if you miss your dose. If you miss a dose due to an On-body Injector failure or leakage, a new dose should be administered as soon as possible using a single prefilled syringe for manual use. What may interact with this medicine? Interactions have not been studied. Give your health care provider a list of all the medicines, herbs, non-prescription drugs, or dietary supplements you use. Also tell them if you smoke, drink alcohol, or use illegal drugs. Some items may interact with your medicine. This list may not describe all possible interactions. Give your health care provider a list of all the medicines, herbs, non-prescription drugs, or dietary supplements you use. Also tell them if you smoke, drink alcohol, or use illegal drugs. Some items may interact with your medicine. What should I watch for while using this medicine? You may need blood work done while you are taking this medicine. If you are going to need a MRI, CT scan, or other procedure, tell your doctor that you are using this medicine (On-Body Injector only). What side effects may I notice from receiving this medicine? Side effects that you should report to your doctor or health care professional as soon as possible: -allergic reactions like skin rash, itching or hives, swelling of the face, lips, or tongue -dizziness -fever -pain, redness, or irritation at site   where injected -pinpoint red spots on the skin -red or dark-brown urine -shortness of breath or breathing problems -stomach or side pain, or pain at the shoulder -swelling -tiredness -trouble passing urine or change in the amount of urine Side  effects that usually do not require medical attention (report to your doctor or health care professional if they continue or are bothersome): -bone pain -muscle pain This list may not describe all possible side effects. Call your doctor for medical advice about side effects. You may report side effects to FDA at 1-800-FDA-1088. Where should I keep my medicine? Keep out of the reach of children. Store pre-filled syringes in a refrigerator between 2 and 8 degrees C (36 and 46 degrees F). Do not freeze. Keep in carton to protect from light. Throw away this medicine if it is left out of the refrigerator for more than 48 hours. Throw away any unused medicine after the expiration date. NOTE: This sheet is a summary. It may not cover all possible information. If you have questions about this medicine, talk to your doctor, pharmacist, or health care provider.  2018 Elsevier/Gold Standard (2016-03-10 12:58:03)  

## 2017-10-18 NOTE — Telephone Encounter (Signed)
Critical Value WBC 0.2, ANC 0.1 Dr Marin Olp notified. No orders at this time.

## 2017-10-20 ENCOUNTER — Other Ambulatory Visit: Payer: Self-pay | Admitting: *Deleted

## 2017-10-20 ENCOUNTER — Inpatient Hospital Stay: Payer: Medicare Other

## 2017-10-20 ENCOUNTER — Ambulatory Visit: Payer: Medicare Other

## 2017-10-20 ENCOUNTER — Ambulatory Visit: Payer: Medicare Other | Admitting: Hematology & Oncology

## 2017-10-20 ENCOUNTER — Telehealth: Payer: Self-pay | Admitting: *Deleted

## 2017-10-20 DIAGNOSIS — C9000 Multiple myeloma not having achieved remission: Secondary | ICD-10-CM

## 2017-10-20 DIAGNOSIS — C9002 Multiple myeloma in relapse: Secondary | ICD-10-CM | POA: Diagnosis not present

## 2017-10-20 LAB — CBC WITH DIFFERENTIAL (CANCER CENTER ONLY)
Basophils Absolute: 0 10*3/uL (ref 0.0–0.1)
Basophils Relative: 0 %
EOS ABS: 0 10*3/uL (ref 0.0–0.5)
Eosinophils Relative: 0 %
HCT: 20.5 % — ABNORMAL LOW (ref 34.8–46.6)
HEMOGLOBIN: 6.8 g/dL — AB (ref 11.6–15.9)
Lymphocytes Relative: 75 %
Lymphs Abs: 0.1 10*3/uL — ABNORMAL LOW (ref 0.9–3.3)
MCH: 29.3 pg (ref 26.0–34.0)
MCHC: 33.2 g/dL (ref 32.0–36.0)
MCV: 88.4 fL (ref 81.0–101.0)
MONO ABS: 0 10*3/uL — AB (ref 0.1–0.9)
Monocytes Relative: 8 %
NEUTROS PCT: 17 %
Neutro Abs: 0 10*3/uL — CL (ref 1.5–6.5)
Platelet Count: 2 10*3/uL — CL (ref 145–400)
RBC: 2.32 MIL/uL — AB (ref 3.70–5.32)
RDW: 16.6 % — ABNORMAL HIGH (ref 11.1–15.7)
WBC: 0.1 10*3/uL — AB (ref 3.9–10.0)

## 2017-10-20 LAB — CMP (CANCER CENTER ONLY)
ALBUMIN: 2.6 g/dL — AB (ref 3.5–5.0)
ALK PHOS: 77 U/L (ref 26–84)
ALT: 27 U/L (ref 10–47)
AST: 26 U/L (ref 11–38)
Anion gap: 15 (ref 5–15)
BUN: 57 mg/dL — AB (ref 7–22)
CHLORIDE: 112 mmol/L — AB (ref 98–108)
CO2: 15 mmol/L — ABNORMAL LOW (ref 18–33)
CREATININE: 2.3 mg/dL — AB (ref 0.60–1.20)
Calcium: 7.8 mg/dL — ABNORMAL LOW (ref 8.0–10.3)
Glucose, Bld: 144 mg/dL — ABNORMAL HIGH (ref 73–118)
Potassium: 4.1 mmol/L (ref 3.3–4.7)
SODIUM: 142 mmol/L (ref 128–145)
Total Bilirubin: 0.5 mg/dL (ref 0.2–1.6)
Total Protein: 5 g/dL — ABNORMAL LOW (ref 6.4–8.1)

## 2017-10-20 LAB — SAMPLE TO BLOOD BANK

## 2017-10-20 MED ORDER — HEPARIN SOD (PORK) LOCK FLUSH 100 UNIT/ML IV SOLN
500.0000 [IU] | Freq: Every day | INTRAVENOUS | Status: AC | PRN
  Administered 2017-10-20: 500 [IU]
  Filled 2017-10-20: qty 5

## 2017-10-20 MED ORDER — SODIUM CHLORIDE 0.9% IV SOLUTION
250.0000 mL | Freq: Once | INTRAVENOUS | Status: AC
  Administered 2017-10-20: 250 mL via INTRAVENOUS
  Filled 2017-10-20: qty 250

## 2017-10-20 MED ORDER — SODIUM CHLORIDE 0.9% FLUSH
10.0000 mL | INTRAVENOUS | Status: AC | PRN
  Administered 2017-10-20: 10 mL
  Filled 2017-10-20: qty 10

## 2017-10-20 NOTE — Patient Instructions (Signed)
Thrombocytopenia Thrombocytopenia means that you have a low number of platelets in your blood. Platelets are tiny cells in the blood. When you bleed, they clump together at the cut or injury to stop the bleeding. This is called blood clotting. Not having enough platelets can cause bleeding problems. Follow these instructions at home: General instructions  Check your skin and inside your mouth for bruises or blood as told by your doctor.  Check to see if there is blood in your spit (sputum), pee (urine), and poop (stool). Do this as told by your doctor.  Ask your doctor if you can drink alcohol.  Take over-the-counter and prescription medicines only as told by your doctor.  Tell all of your doctors that you have this condition. Be sure to tell your dentist and eye doctor too. Activity  Do not do activities that can cause bumps or bruises until your doctor says it is okay.  Be careful not to cut yourself: ? When you shave. ? When you use scissors, needles, knives, or other tools.  Be careful not to burn yourself: ? When you use an iron. ? When you cook. Contact a doctor if:  You have bruises and you do not know why. Get help right away if:  You are bleeding anywhere on your body.  You have blood in your spit, pee, or poop. This information is not intended to replace advice given to you by your health care provider. Make sure you discuss any questions you have with your health care provider. Document Released: 03/03/2011 Document Revised: 11/15/2015 Document Reviewed: 09/15/2014 Elsevier Interactive Patient Education  2018 Elsevier Inc.   

## 2017-10-20 NOTE — Telephone Encounter (Signed)
Critical Value  WBC 0.1 Hgb 6.8 Platelets 2 ANC 0  Dr Marin Olp notified. Orders will be placed.

## 2017-10-23 ENCOUNTER — Other Ambulatory Visit: Payer: Self-pay | Admitting: Hematology & Oncology

## 2017-10-23 ENCOUNTER — Inpatient Hospital Stay: Payer: Medicare Other

## 2017-10-23 ENCOUNTER — Other Ambulatory Visit: Payer: Self-pay | Admitting: *Deleted

## 2017-10-23 ENCOUNTER — Telehealth: Payer: Self-pay | Admitting: *Deleted

## 2017-10-23 ENCOUNTER — Telehealth: Payer: Self-pay | Admitting: Hematology & Oncology

## 2017-10-23 ENCOUNTER — Encounter: Payer: Self-pay | Admitting: Hematology & Oncology

## 2017-10-23 ENCOUNTER — Inpatient Hospital Stay (HOSPITAL_BASED_OUTPATIENT_CLINIC_OR_DEPARTMENT_OTHER): Payer: Medicare Other | Admitting: Hematology & Oncology

## 2017-10-23 ENCOUNTER — Other Ambulatory Visit: Payer: Self-pay

## 2017-10-23 VITALS — BP 114/75 | HR 92 | Temp 98.4°F | Resp 18 | Wt 93.0 lb

## 2017-10-23 DIAGNOSIS — N179 Acute kidney failure, unspecified: Secondary | ICD-10-CM | POA: Diagnosis not present

## 2017-10-23 DIAGNOSIS — C9000 Multiple myeloma not having achieved remission: Secondary | ICD-10-CM

## 2017-10-23 DIAGNOSIS — C9002 Multiple myeloma in relapse: Secondary | ICD-10-CM

## 2017-10-23 DIAGNOSIS — D649 Anemia, unspecified: Secondary | ICD-10-CM | POA: Diagnosis not present

## 2017-10-23 DIAGNOSIS — R634 Abnormal weight loss: Secondary | ICD-10-CM | POA: Diagnosis not present

## 2017-10-23 LAB — CBC WITH DIFFERENTIAL (CANCER CENTER ONLY)
BASOS ABS: 0 10*3/uL (ref 0.0–0.1)
BLASTS: 0 %
Band Neutrophils: 0 %
Basophils Relative: 0 %
Eosinophils Absolute: 0 10*3/uL (ref 0.0–0.5)
Eosinophils Relative: 0 %
HCT: 18.7 % — ABNORMAL LOW (ref 34.8–46.6)
Hemoglobin: 6.2 g/dL — CL (ref 11.6–15.9)
Lymphocytes Relative: 92 %
Lymphs Abs: 0.1 10*3/uL — ABNORMAL LOW (ref 0.9–3.3)
MCH: 29.4 pg (ref 26.0–34.0)
MCHC: 33.2 g/dL (ref 32.0–36.0)
MCV: 88.6 fL (ref 81.0–101.0)
METAMYELOCYTES PCT: 0 %
MONOS PCT: 0 %
Monocytes Absolute: 0 10*3/uL — ABNORMAL LOW (ref 0.1–0.9)
Myelocytes: 0 %
NEUTROS ABS: 0 10*3/uL — AB (ref 1.5–6.5)
Neutrophils Relative %: 8 %
Other: 0 %
PLATELETS: 6 10*3/uL — AB (ref 145–400)
Promyelocytes Relative: 0 %
RBC: 2.11 MIL/uL — AB (ref 3.70–5.32)
RDW: 16 % — ABNORMAL HIGH (ref 11.1–15.7)
WBC: 0.1 10*3/uL — AB (ref 3.9–10.0)
nRBC: 0 /100 WBC

## 2017-10-23 LAB — SAMPLE TO BLOOD BANK

## 2017-10-23 LAB — PREPARE PLATELET PHERESIS: UNIT DIVISION: 0

## 2017-10-23 LAB — BPAM PLATELET PHERESIS
Blood Product Expiration Date: 201907272359
ISSUE DATE / TIME: 201907261242
Unit Type and Rh: 6200

## 2017-10-23 LAB — CMP (CANCER CENTER ONLY)
ALT: 23 U/L (ref 10–47)
ANION GAP: 10 (ref 5–15)
AST: 21 U/L (ref 11–38)
Albumin: 2.9 g/dL — ABNORMAL LOW (ref 3.5–5.0)
Alkaline Phosphatase: 72 U/L (ref 26–84)
BILIRUBIN TOTAL: 0.5 mg/dL (ref 0.2–1.6)
BUN: 45 mg/dL — ABNORMAL HIGH (ref 7–22)
CO2: 19 mmol/L (ref 18–33)
Calcium: 7.4 mg/dL — ABNORMAL LOW (ref 8.0–10.3)
Chloride: 110 mmol/L — ABNORMAL HIGH (ref 98–108)
Creatinine: 2.2 mg/dL — ABNORMAL HIGH (ref 0.60–1.20)
GLUCOSE: 113 mg/dL (ref 73–118)
POTASSIUM: 4 mmol/L (ref 3.3–4.7)
Sodium: 139 mmol/L (ref 128–145)
TOTAL PROTEIN: 5.4 g/dL — AB (ref 6.4–8.1)

## 2017-10-23 LAB — PREPARE RBC (CROSSMATCH)

## 2017-10-23 MED ORDER — SODIUM CHLORIDE 0.9% IV SOLUTION
250.0000 mL | Freq: Once | INTRAVENOUS | Status: AC
  Administered 2017-10-23: 250 mL via INTRAVENOUS
  Filled 2017-10-23: qty 250

## 2017-10-23 MED ORDER — SODIUM CHLORIDE 0.9% FLUSH
10.0000 mL | INTRAVENOUS | Status: AC | PRN
  Administered 2017-10-23: 10 mL
  Filled 2017-10-23: qty 10

## 2017-10-23 MED ORDER — HEPARIN SOD (PORK) LOCK FLUSH 100 UNIT/ML IV SOLN
500.0000 [IU] | Freq: Every day | INTRAVENOUS | Status: AC | PRN
  Administered 2017-10-23: 500 [IU]
  Filled 2017-10-23: qty 5

## 2017-10-23 NOTE — Telephone Encounter (Signed)
Critical Values  WBC 0.1 Hgb 6.2 Platelet 6 ANC 0.0  Dr Marin Olp notified. No orders at this time.

## 2017-10-23 NOTE — Telephone Encounter (Signed)
NO LOS FOR 7/29 VISIT °

## 2017-10-23 NOTE — Progress Notes (Signed)
Hematology and Oncology Follow Up Visit  Jeanne Martinez [id number] 1950-05-24 66 y.o. 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  She got through her first cycle of VD-PACE a week ago.  She was discharged on July 23.  She has not yet lost her hair.  She is on prophylactic antibiotics with Levaquin, Famvir, and Diflucan.  She did get Neulasta last Wednesday.  Her blood counts are still markedly low.  I am not really surprised by this given her renal function when she was admitted and that we had to dose reduce her chemotherapy.  She had marked improvement of her renal function during chemotherapy while she was in the hospital.  Usually, this is a good sign that she is responding.  I do not have her light chains back yet.  She has lost little weight.  This is a little bit trouble some.  There is been no bleeding.  She is had no diarrhea.  She has had some leg swelling.  This probably is from the anemia that we are trying to manage.  Thankfully she has had no fever.  She has a markedly suppressed white cell count with a total white cell count of 0.1 today.  There is been no obvious headache.  She is had no mouth sores.  She is had no swollen lymph nodes.  Overall, her performance status is ECOG 1.    Medications:  Allergies as of 10/23/2017      Reactions   Milk-related Compounds Anaphylaxis   Throat closes the patient cannot drink milk  but can eat milk  products  Including cheese      Medication List        Accurate as of 10/23/17  9:23 AM. Always use your most recent med list.          acetaminophen 500 MG tablet Commonly known as:  TYLENOL Take 1,000 mg by mouth every 6 (six) hours as needed.   acyclovir 400 MG tablet Commonly known as:  ZOVIRAX Take 1 tablet (400 mg total) by mouth 2 (two) times daily.   antiseptic oral rinse Liqd 15 mLs by Mouth Rinse route every 4 (four) hours.   diphenhydrAMINE 25 MG tablet Commonly known as:  BENADRYL Take 25 mg by mouth every 6 (six) hours as needed.   famciclovir 250 MG tablet Commonly known as:  FAMVIR TAKE 1 TABLET BY MOUTH EVERY DAY   fluconazole 100 MG tablet Commonly known as:  DIFLUCAN Take 1 tablet (100 mg total) by mouth daily.   levofloxacin 250 MG tablet Commonly known as:  LEVAQUIN Take 1 tablet (250 mg total) by mouth daily.   lidocaine-prilocaine cream Commonly known as:  EMLA Apply 1 application topically as needed. Apply to port one hour before appointment   LORazepam 0.5 MG tablet Commonly known as:  ATIVAN Take 0.5 mg by mouth every 6 (six) hours as needed (NAUSEA AND VOMITING).   LORazepam 0.5 MG tablet Commonly known as:  ATIVAN Take 1  tablet (0.5 mg total) by mouth every 6 (six) hours as needed (Nausea or vomiting).   ondansetron 8 MG tablet Commonly known as:  ZOFRAN Take 1 tablet (8 mg total) by mouth 2 (two) times daily as needed for refractory nausea / vomiting. Start on the second day after inpatient chemo completed.   prochlorperazine 10 MG tablet Commonly known as:  COMPAZINE Take 10 mg by mouth every 6 (six) hours as needed for nausea or vomiting.   prochlorperazine 10 MG tablet Commonly known as:  COMPAZINE Take 1 tablet (10 mg total) by mouth every 6 (six) hours as needed (Nausea or vomiting).       Allergies:  Allergies  Allergen Reactions  . Milk-Related Compounds Anaphylaxis    Throat closes the patient cannot drink milk   but can eat milk products  Including cheese    Past Medical History, Surgical history, Social history, and Family History were reviewed and updated.  Review of Systems: Review of Systems  Constitutional: Positive for weight loss.  HENT: Negative.   Eyes: Negative.   Respiratory: Negative.   Cardiovascular: Negative.   Gastrointestinal: Negative.   Genitourinary: Negative.   Musculoskeletal: Negative.   Skin: Negative.   Neurological: Negative.   Endo/Heme/Allergies: Negative.   Psychiatric/Behavioral: Negative.    Marland Kitchen   Physical Exam:  weight is 93 lb (42.2 kg). Her oral temperature is 98.4 F (36.9 C). Her blood pressure is 114/75 and her pulse is 92. Her respiration is 18 and oxygen saturation is 100%.   Wt Readings from Last 3 Encounters:  10/23/17 93 lb (42.2 kg)  10/11/17 100 lb 8.5 oz (45.6 kg)  10/09/17 99 lb (44.9 kg)    Physical Exam  Constitutional: She is oriented to person, place, and time.  HENT:  Head: Normocephalic and atraumatic.  Mouth/Throat: Oropharynx is clear and moist.  Eyes: Pupils are equal, round, and reactive to light. EOM are normal.  Neck: Normal range of motion.  Cardiovascular: Normal rate, regular rhythm and normal heart sounds.  Pulmonary/Chest: Effort normal and breath sounds normal.  Abdominal: Soft. Bowel sounds are normal.  Musculoskeletal: Normal range of motion. She exhibits no edema, tenderness or deformity.  Lymphadenopathy:    She has no cervical adenopathy.  Neurological: She is alert and oriented to person, place, and time.  Skin: Skin is warm and dry. No rash noted. No erythema.  Psychiatric: She has a normal mood and affect. Her behavior is normal. Judgment and thought content normal.  Vitals reviewed.    Lab Results  Component Value Date   WBC 0.1 (LL) 10/20/2017   HGB 6.8 (LL) 10/20/2017   HCT 20.5 (L) 10/20/2017   MCV 88.4 10/20/2017   PLT 2 (LL) 10/20/2017   Lab Results  Component Value Date   FERRITIN 1,608  (H) 08/23/2017   IRON 98 08/23/2017   TIBC 210 (L) 08/23/2017   UIBC 112 08/23/2017   IRONPCTSAT 47 08/23/2017   Lab Results  Component Value Date   RBC 2.32 (L) 10/20/2017   Lab Results  Component Value Date   KPAFRELGTCHN 21,194.2 (H) 10/06/2017   LAMBDASER <1.5 (L) 10/06/2017   KAPLAMBRATIO See below. 10/06/2017   Lab Results  Component Value Date   IGGSERUM 133 (L) 10/06/2017   IGA 6 (L) 10/06/2017   IGMSERUM <5 (L) 10/06/2017   Lab Results  Component Value Date   TOTALPROTELP 5.0 (L) 10/06/2017   ALBUMINELP 2.6 (L) 10/06/2017   A1GS 0.3 10/06/2017   A2GS 1.1 (H) 10/06/2017  BETS 0.7 10/06/2017   GAMS 0.4 10/06/2017   MSPIKE 0.3 (H) 10/06/2017   SPEI Comment 10/06/2017     Chemistry      Component Value Date/Time   NA 142 10/20/2017 0833   NA 148 (H) 03/17/2017 1210   K 4.1 10/20/2017 0833   K 4.4 03/17/2017 1210   CL 112 (H) 10/20/2017 0833   CL 106 03/17/2017 1210   CO2 15 (L) 10/20/2017 0833   CO2 27 03/17/2017 1210   BUN 57 (H) 10/20/2017 0833   BUN 23 (H) 03/17/2017 1210   CREATININE 2.30 (H) 10/20/2017 0833   CREATININE 1.1 03/17/2017 1210      Component Value Date/Time   CALCIUM 7.8 (L) 10/20/2017 0833   CALCIUM 9.1 03/17/2017 1210   ALKPHOS 77 10/20/2017 0833   ALKPHOS 52 03/17/2017 1210   AST 26 10/20/2017 0833   ALT 27 10/20/2017 0833   ALT 21 03/17/2017 1210   BILITOT 0.5 10/20/2017 0833      Impression and Plan: Ms. Olden is a very pleasant 67 yo caucasian female with IgA kappa myeloma with 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   He will be interesting to see what her light chains are.  Hopefully, we will see an improvement.  She clearly needs to be transfused with red cells and platelets today.  I would hope that her blood count will begin to improve.  I know that it is going to be a very "close call" as to whether or not she will end up in the hospital again.  I will not make sure to have her blood checked 3  times a week.  We will plan to see her back in 1 week.  Volanda Napoleon, MD 7/29/20199:23 AM

## 2017-10-24 ENCOUNTER — Encounter: Payer: Self-pay | Admitting: Hematology & Oncology

## 2017-10-24 ENCOUNTER — Other Ambulatory Visit: Payer: Medicare Other

## 2017-10-24 ENCOUNTER — Inpatient Hospital Stay: Payer: Medicare Other

## 2017-10-24 DIAGNOSIS — C9002 Multiple myeloma in relapse: Secondary | ICD-10-CM | POA: Diagnosis not present

## 2017-10-24 DIAGNOSIS — C9000 Multiple myeloma not having achieved remission: Secondary | ICD-10-CM

## 2017-10-24 LAB — PREPARE PLATELET PHERESIS: Unit division: 0

## 2017-10-24 LAB — BPAM PLATELET PHERESIS
BLOOD PRODUCT EXPIRATION DATE: 201907312359
ISSUE DATE / TIME: 201907291053
UNIT TYPE AND RH: 6200

## 2017-10-24 MED ORDER — SODIUM CHLORIDE 0.9% FLUSH
10.0000 mL | INTRAVENOUS | Status: DC | PRN
  Administered 2017-10-24: 10 mL via INTRAVENOUS
  Filled 2017-10-24: qty 10

## 2017-10-24 MED ORDER — FUROSEMIDE 10 MG/ML IJ SOLN: 20.0000 mg | Freq: Once | INTRAMUSCULAR | Status: DC

## 2017-10-24 MED ORDER — HEPARIN SOD (PORK) LOCK FLUSH 100 UNIT/ML IV SOLN
500.0000 [IU] | Freq: Once | INTRAVENOUS | Status: AC
  Administered 2017-10-24: 500 [IU] via INTRAVENOUS
  Filled 2017-10-24: qty 5

## 2017-10-24 MED ORDER — HEPARIN SOD (PORK) LOCK FLUSH 100 UNIT/ML IV SOLN
250.0000 [IU] | INTRAVENOUS | Status: DC | PRN
  Filled 2017-10-24: qty 5

## 2017-10-24 MED ORDER — SODIUM CHLORIDE 0.9 % IV SOLN
INTRAVENOUS | Status: DC
  Administered 2017-10-24: 10:00:00 via INTRAVENOUS
  Filled 2017-10-24: qty 250

## 2017-10-24 NOTE — Patient Instructions (Signed)

## 2017-10-25 ENCOUNTER — Inpatient Hospital Stay: Payer: Medicare Other

## 2017-10-25 ENCOUNTER — Encounter: Payer: Self-pay | Admitting: Hematology & Oncology

## 2017-10-25 ENCOUNTER — Other Ambulatory Visit: Payer: Self-pay | Admitting: *Deleted

## 2017-10-25 ENCOUNTER — Telehealth: Payer: Self-pay | Admitting: *Deleted

## 2017-10-25 ENCOUNTER — Other Ambulatory Visit: Payer: Self-pay

## 2017-10-25 VITALS — BP 144/71 | HR 78 | Temp 98.5°F | Resp 18 | Wt 95.0 lb

## 2017-10-25 DIAGNOSIS — C9002 Multiple myeloma in relapse: Secondary | ICD-10-CM | POA: Diagnosis not present

## 2017-10-25 DIAGNOSIS — Z95828 Presence of other vascular implants and grafts: Secondary | ICD-10-CM

## 2017-10-25 DIAGNOSIS — C9000 Multiple myeloma not having achieved remission: Secondary | ICD-10-CM

## 2017-10-25 LAB — CBC WITH DIFFERENTIAL (CANCER CENTER ONLY)
Basophils Absolute: 0 10*3/uL (ref 0.0–0.1)
Basophils Relative: 0 %
EOS ABS: 0 10*3/uL (ref 0.0–0.5)
EOS PCT: 0 %
HCT: 26.5 % — ABNORMAL LOW (ref 34.8–46.6)
Hemoglobin: 8.9 g/dL — ABNORMAL LOW (ref 11.6–15.9)
Lymphocytes Relative: 36 %
Lymphs Abs: 0.1 10*3/uL — ABNORMAL LOW (ref 0.9–3.3)
MCH: 29.2 pg (ref 26.0–34.0)
MCHC: 33.6 g/dL (ref 32.0–36.0)
MCV: 86.9 fL (ref 81.0–101.0)
MONOS PCT: 5 %
Monocytes Absolute: 0 10*3/uL — ABNORMAL LOW (ref 0.1–0.9)
Neutro Abs: 0.1 10*3/uL — CL (ref 1.5–6.5)
Neutrophils Relative %: 59 %
Platelet Count: 18 10*3/uL — ABNORMAL LOW (ref 145–400)
RBC: 3.05 MIL/uL — ABNORMAL LOW (ref 3.70–5.32)
RDW: 15.6 % (ref 11.1–15.7)
WBC Count: 0.2 10*3/uL — CL (ref 3.9–10.0)

## 2017-10-25 LAB — CMP (CANCER CENTER ONLY)
ALT: 20 U/L (ref 10–47)
ANION GAP: 6 (ref 5–15)
AST: 21 U/L (ref 11–38)
Albumin: 2.7 g/dL — ABNORMAL LOW (ref 3.5–5.0)
Alkaline Phosphatase: 78 U/L (ref 26–84)
BILIRUBIN TOTAL: 0.6 mg/dL (ref 0.2–1.6)
BUN: 38 mg/dL — ABNORMAL HIGH (ref 7–22)
CALCIUM: 7.6 mg/dL — AB (ref 8.0–10.3)
CO2: 20 mmol/L (ref 18–33)
Chloride: 113 mmol/L — ABNORMAL HIGH (ref 98–108)
Creatinine: 2.3 mg/dL — ABNORMAL HIGH (ref 0.60–1.20)
Glucose, Bld: 134 mg/dL — ABNORMAL HIGH (ref 73–118)
POTASSIUM: 3.3 mmol/L (ref 3.3–4.7)
Sodium: 139 mmol/L (ref 128–145)
Total Protein: 5.5 g/dL — ABNORMAL LOW (ref 6.4–8.1)

## 2017-10-25 LAB — TYPE AND SCREEN
ABO/RH(D): A POS
ANTIBODY SCREEN: POSITIVE
Unit division: 0
Unit division: 0

## 2017-10-25 LAB — BPAM RBC
BLOOD PRODUCT EXPIRATION DATE: 201908242359
Blood Product Expiration Date: 201908232359
ISSUE DATE / TIME: 201907300809
ISSUE DATE / TIME: 201907300809
UNIT TYPE AND RH: 6200
Unit Type and Rh: 6200

## 2017-10-25 LAB — KAPPA/LAMBDA LIGHT CHAINS: KAPPA FREE LGHT CHN: 996.8 mg/L — AB (ref 3.3–19.4)

## 2017-10-25 LAB — SAMPLE TO BLOOD BANK

## 2017-10-25 LAB — IGG, IGA, IGM
IGA: 17 mg/dL — AB (ref 87–352)
IGG (IMMUNOGLOBIN G), SERUM: 180 mg/dL — AB (ref 700–1600)
IgM (Immunoglobulin M), Srm: 9 mg/dL — ABNORMAL LOW (ref 26–217)

## 2017-10-25 MED ORDER — SODIUM CHLORIDE 0.9% FLUSH
10.0000 mL | INTRAVENOUS | Status: DC | PRN
  Administered 2017-10-25: 10 mL via INTRAVENOUS
  Filled 2017-10-25: qty 10

## 2017-10-25 MED ORDER — UNABLE TO FIND: 1 refills | Status: DC

## 2017-10-25 MED ORDER — HEPARIN SOD (PORK) LOCK FLUSH 100 UNIT/ML IV SOLN
500.0000 [IU] | Freq: Once | INTRAVENOUS | Status: AC
  Administered 2017-10-25: 500 [IU] via INTRAVENOUS
  Filled 2017-10-25: qty 5

## 2017-10-25 NOTE — Patient Instructions (Signed)

## 2017-10-25 NOTE — Telephone Encounter (Signed)
Critical Value WBC 0.2 ANC 0.1 Dr Marin Olp notified. No order received.

## 2017-10-26 ENCOUNTER — Telehealth: Payer: Self-pay | Admitting: *Deleted

## 2017-10-26 ENCOUNTER — Other Ambulatory Visit: Payer: Self-pay | Admitting: *Deleted

## 2017-10-26 DIAGNOSIS — C9000 Multiple myeloma not having achieved remission: Secondary | ICD-10-CM

## 2017-10-26 LAB — PROTEIN ELECTROPHORESIS, SERUM, WITH REFLEX
A/G Ratio: 1.3 (ref 0.7–1.7)
ALPHA-1-GLOBULIN: 0.3 g/dL (ref 0.0–0.4)
Albumin ELP: 2.8 g/dL — ABNORMAL LOW (ref 2.9–4.4)
Alpha-2-Globulin: 0.8 g/dL (ref 0.4–1.0)
Beta Globulin: 0.8 g/dL (ref 0.7–1.3)
Gamma Globulin: 0.2 g/dL — ABNORMAL LOW (ref 0.4–1.8)
Globulin, Total: 2.2 g/dL (ref 2.2–3.9)
M-Spike, %: 0.1 g/dL — ABNORMAL HIGH
SPEP Interpretation: 0
Total Protein ELP: 5 g/dL — ABNORMAL LOW (ref 6.0–8.5)

## 2017-10-26 LAB — IMMUNOFIXATION REFLEX, SERUM
IGA: 17 mg/dL — AB (ref 87–352)
IGM (IMMUNOGLOBULIN M), SRM: 8 mg/dL — AB (ref 26–217)
IgG (Immunoglobin G), Serum: 179 mg/dL — ABNORMAL LOW (ref 700–1600)

## 2017-10-26 NOTE — Telephone Encounter (Addendum)
Daughter is aware of results  ----- Message from Volanda Napoleon, MD sent at 10/26/2017  8:13 AM EDT ----- Call her dgtr -- laight chain went from 21000 to 1000!!!   Demetrios Isaacs!!!  Laurey Arrow

## 2017-10-27 ENCOUNTER — Inpatient Hospital Stay: Payer: Medicare Other

## 2017-10-27 ENCOUNTER — Other Ambulatory Visit: Payer: Self-pay | Admitting: *Deleted

## 2017-10-27 ENCOUNTER — Ambulatory Visit: Payer: Medicare Other

## 2017-10-27 ENCOUNTER — Ambulatory Visit: Payer: Medicare Other | Admitting: Hematology & Oncology

## 2017-10-27 ENCOUNTER — Telehealth: Payer: Self-pay | Admitting: *Deleted

## 2017-10-27 ENCOUNTER — Inpatient Hospital Stay: Payer: Medicare Other | Attending: Hematology & Oncology

## 2017-10-27 VITALS — BP 117/75 | HR 99 | Temp 97.7°F | Resp 17

## 2017-10-27 DIAGNOSIS — N179 Acute kidney failure, unspecified: Secondary | ICD-10-CM | POA: Insufficient documentation

## 2017-10-27 DIAGNOSIS — C9000 Multiple myeloma not having achieved remission: Secondary | ICD-10-CM

## 2017-10-27 DIAGNOSIS — D649 Anemia, unspecified: Secondary | ICD-10-CM | POA: Diagnosis not present

## 2017-10-27 DIAGNOSIS — R634 Abnormal weight loss: Secondary | ICD-10-CM | POA: Diagnosis not present

## 2017-10-27 DIAGNOSIS — R609 Edema, unspecified: Secondary | ICD-10-CM | POA: Diagnosis not present

## 2017-10-27 DIAGNOSIS — Z95828 Presence of other vascular implants and grafts: Secondary | ICD-10-CM

## 2017-10-27 LAB — CBC WITH DIFFERENTIAL (CANCER CENTER ONLY)
Basophils Absolute: 0 10*3/uL (ref 0.0–0.1)
Basophils Relative: 0 %
Eosinophils Absolute: 0 10*3/uL (ref 0.0–0.5)
Eosinophils Relative: 0 %
HEMATOCRIT: 25.8 % — AB (ref 34.8–46.6)
Hemoglobin: 8.6 g/dL — ABNORMAL LOW (ref 11.6–15.9)
LYMPHS PCT: 15 %
Lymphs Abs: 0.1 10*3/uL — ABNORMAL LOW (ref 0.9–3.3)
MCH: 29.3 pg (ref 26.0–34.0)
MCHC: 33.3 g/dL (ref 32.0–36.0)
MCV: 87.8 fL (ref 81.0–101.0)
MONO ABS: 0 10*3/uL — AB (ref 0.1–0.9)
Monocytes Relative: 6 %
NEUTROS ABS: 0.4 10*3/uL — AB (ref 1.5–6.5)
Neutrophils Relative %: 79 %
Platelet Count: 5 10*3/uL — CL (ref 145–400)
RBC: 2.94 MIL/uL — AB (ref 3.70–5.32)
RDW: 15.4 % (ref 11.1–15.7)
WBC: 0.6 10*3/uL — AB (ref 3.9–10.0)

## 2017-10-27 LAB — CMP (CANCER CENTER ONLY)
ALK PHOS: 77 U/L (ref 26–84)
ALT: 21 U/L (ref 10–47)
ANION GAP: 7 (ref 5–15)
AST: 22 U/L (ref 11–38)
Albumin: 3 g/dL — ABNORMAL LOW (ref 3.5–5.0)
BILIRUBIN TOTAL: 0.6 mg/dL (ref 0.2–1.6)
BUN: 35 mg/dL — ABNORMAL HIGH (ref 7–22)
CALCIUM: 8.1 mg/dL (ref 8.0–10.3)
CO2: 20 mmol/L (ref 18–33)
Chloride: 109 mmol/L — ABNORMAL HIGH (ref 98–108)
Creatinine: 2.1 mg/dL — ABNORMAL HIGH (ref 0.60–1.20)
Glucose, Bld: 97 mg/dL (ref 73–118)
Potassium: 3.8 mmol/L (ref 3.3–4.7)
Sodium: 136 mmol/L (ref 128–145)
TOTAL PROTEIN: 5.6 g/dL — AB (ref 6.4–8.1)

## 2017-10-27 LAB — SAMPLE TO BLOOD BANK

## 2017-10-27 MED ORDER — SODIUM CHLORIDE 0.9% FLUSH
10.0000 mL | INTRAVENOUS | Status: AC | PRN
  Administered 2017-10-27: 10 mL
  Filled 2017-10-27: qty 10

## 2017-10-27 MED ORDER — HEPARIN SOD (PORK) LOCK FLUSH 100 UNIT/ML IV SOLN
500.0000 [IU] | Freq: Every day | INTRAVENOUS | Status: AC | PRN
  Administered 2017-10-27: 500 [IU]
  Filled 2017-10-27: qty 5

## 2017-10-27 MED ORDER — SODIUM CHLORIDE 0.9% IV SOLUTION
250.0000 mL | Freq: Once | INTRAVENOUS | Status: AC
  Administered 2017-10-27: 250 mL via INTRAVENOUS
  Filled 2017-10-27: qty 250

## 2017-10-27 MED ORDER — HEPARIN SOD (PORK) LOCK FLUSH 100 UNIT/ML IV SOLN
250.0000 [IU] | INTRAVENOUS | Status: DC | PRN
  Filled 2017-10-27: qty 5

## 2017-10-27 MED ORDER — HEPARIN SOD (PORK) LOCK FLUSH 100 UNIT/ML IV SOLN
500.0000 [IU] | Freq: Once | INTRAVENOUS | Status: DC
  Filled 2017-10-27: qty 5

## 2017-10-27 MED ORDER — SODIUM CHLORIDE 0.9% FLUSH
10.0000 mL | INTRAVENOUS | Status: DC | PRN
  Filled 2017-10-27: qty 10

## 2017-10-27 NOTE — Patient Instructions (Signed)
Platelet Transfusion, Care After °Refer to this sheet in the next few weeks. These instructions provide you with information about caring for yourself after your procedure. Your health care provider may also give you more specific instructions. Your treatment has been planned according to current medical practices, but problems sometimes occur. Call your health care provider if you have any problems or questions after your procedure. °What can I expect after the procedure? °After the procedure, it is common to have: °· Bruising and soreness at the IV site. °· Fever or chills within the first 48 hours of your transfusion. ° °Follow these instructions at home: °· Take medicines only as directed by your health care provider. Ask your health care provider if you can take an over-the-counter pain reliever in case you have a fever or headache a day or two after your transfusion. °· Return to your normal activities as directed by your health care provider. °Contact a health care provider if: °· You have a fever. °· You have a headache. °· You have redness, swelling, or pain at your IV site. °· You have skin itching or a rash. °· You vomit. °· You feel unusually tired or weak. °Get help right away if: °· You have trouble breathing. °· You have a decreased amount of urine or you urinate less often than you normally do. °· Your urine is darker than normal. °· You have pain in your back, abdomen, or chest. °· You have cool, clammy skin. °· You have a rapid heartbeat. °This information is not intended to replace advice given to you by your health care provider. Make sure you discuss any questions you have with your health care provider. °Document Released: 04/04/2014 Document Revised: 08/20/2015 Document Reviewed: 01/22/2014 °Elsevier Interactive Patient Education © 2018 [employer] ° °

## 2017-10-27 NOTE — Telephone Encounter (Signed)
Critical Value Plt 5 ANC 0.4 Dr Marin Olp notified. Orders will be placed.

## 2017-10-30 ENCOUNTER — Other Ambulatory Visit: Payer: Self-pay

## 2017-10-30 ENCOUNTER — Inpatient Hospital Stay: Payer: Medicare Other

## 2017-10-30 ENCOUNTER — Telehealth: Payer: Self-pay | Admitting: Hematology & Oncology

## 2017-10-30 ENCOUNTER — Encounter: Payer: Self-pay | Admitting: Hematology & Oncology

## 2017-10-30 ENCOUNTER — Inpatient Hospital Stay (HOSPITAL_BASED_OUTPATIENT_CLINIC_OR_DEPARTMENT_OTHER): Payer: Medicare Other | Admitting: Hematology & Oncology

## 2017-10-30 VITALS — BP 129/61 | HR 86 | Temp 98.0°F | Resp 18 | Wt 93.2 lb

## 2017-10-30 DIAGNOSIS — C9000 Multiple myeloma not having achieved remission: Secondary | ICD-10-CM | POA: Diagnosis not present

## 2017-10-30 DIAGNOSIS — N179 Acute kidney failure, unspecified: Secondary | ICD-10-CM

## 2017-10-30 LAB — CMP (CANCER CENTER ONLY)
ALT: 17 U/L (ref 10–47)
AST: 26 U/L (ref 11–38)
Albumin: 2.8 g/dL — ABNORMAL LOW (ref 3.5–5.0)
Alkaline Phosphatase: 83 U/L (ref 26–84)
Anion gap: 9 (ref 5–15)
BUN: 35 mg/dL — AB (ref 7–22)
CALCIUM: 8 mg/dL (ref 8.0–10.3)
CO2: 22 mmol/L (ref 18–33)
Chloride: 109 mmol/L — ABNORMAL HIGH (ref 98–108)
Creatinine: 2.4 mg/dL — ABNORMAL HIGH (ref 0.60–1.20)
GLUCOSE: 129 mg/dL — AB (ref 73–118)
Potassium: 3.3 mmol/L (ref 3.3–4.7)
SODIUM: 140 mmol/L (ref 128–145)
TOTAL PROTEIN: 5.4 g/dL — AB (ref 6.4–8.1)
Total Bilirubin: 0.5 mg/dL (ref 0.2–1.6)

## 2017-10-30 LAB — CBC WITH DIFFERENTIAL (CANCER CENTER ONLY)
BASOS PCT: 0 %
Basophils Absolute: 0 10*3/uL (ref 0.0–0.1)
EOS PCT: 0 %
Eosinophils Absolute: 0 10*3/uL (ref 0.0–0.5)
HCT: 24.3 % — ABNORMAL LOW (ref 34.8–46.6)
Hemoglobin: 8 g/dL — ABNORMAL LOW (ref 11.6–15.9)
LYMPHS PCT: 9 %
Lymphs Abs: 0.1 10*3/uL — ABNORMAL LOW (ref 0.9–3.3)
MCH: 29.3 pg (ref 26.0–34.0)
MCHC: 32.9 g/dL (ref 32.0–36.0)
MCV: 89 fL (ref 81.0–101.0)
Monocytes Absolute: 0.1 10*3/uL (ref 0.1–0.9)
Monocytes Relative: 7 %
Neutro Abs: 1.3 10*3/uL — ABNORMAL LOW (ref 1.5–6.5)
Neutrophils Relative %: 84 %
PLATELETS: 13 10*3/uL — AB (ref 145–400)
RBC: 2.73 MIL/uL — AB (ref 3.70–5.32)
RDW: 15.1 % (ref 11.1–15.7)
WBC: 1.5 10*3/uL — AB (ref 3.9–10.0)

## 2017-10-30 LAB — PREPARE PLATELET PHERESIS: UNIT DIVISION: 0

## 2017-10-30 LAB — SAMPLE TO BLOOD BANK

## 2017-10-30 LAB — BPAM PLATELET PHERESIS
BLOOD PRODUCT EXPIRATION DATE: 201908032359
ISSUE DATE / TIME: 201908020959
Unit Type and Rh: 9500

## 2017-10-30 LAB — LACTATE DEHYDROGENASE: LDH: 170 U/L (ref 98–192)

## 2017-10-30 MED ORDER — PIPERACILLIN-TAZOBACTAM IN DEX 2-0.25 GM/50ML IV SOLN: 2.2500 g | Freq: Once | INTRAVENOUS | Status: DC

## 2017-10-30 MED ORDER — SODIUM CHLORIDE 0.9 % IV SOLN
INTRAVENOUS | Status: AC
  Administered 2017-10-30: 13:00:00 via INTRAVENOUS
  Filled 2017-10-30: qty 250

## 2017-10-30 MED ORDER — PIPERACILLIN-TAZOBACTAM 3.375 G IVPB
2.2500 g | Freq: Once | INTRAVENOUS | Status: AC
  Administered 2017-10-30: 2.25 g via INTRAVENOUS
  Filled 2017-10-30: qty 50

## 2017-10-30 MED ORDER — AMOXICILLIN-POT CLAVULANATE 875-125 MG PO TABS: 1.0000 | ORAL_TABLET | Freq: Every day | ORAL | 0 refills | Status: DC

## 2017-10-30 NOTE — Progress Notes (Signed)
Hematology and Oncology Follow Up Visit  Jeanne Martinez [id number] February 19, 1951 67 y.o. 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  So far, she really has done well with the VD-PACE.  It has worked tremendously well.  Her kappa light chain level went from 21,200 down to 1000.  I am so happy about this.  I spoke with Dr. Zonia Kief from [facility].  They do have a CAR-T protocol for myeloma.  I think this would really be beneficial for Jeanne Martinez.  I talked to she and her family about this.  Dr. Rosalita Levan feels that a second cycle of VD-PACE would be helpful before doing any CAR-T therapy.  Hopefully, her renal function will improve.  Her hair is starting to come out now.  Her blood counts are starting to come up, thankfully.  I do not think we have to transfuse her right now.  She still only weighs 93 pounds.  Her family is trying to get her to eat a little bit more.  She has had no nausea or vomiting.  There is been no mouth sores.  She has had no diarrhea.  Para she has had quite a bit of leg swelling which might be from the steroids and also from her being anemic.  She does have a little bit dehydrated today.  I will give her some fluids.  The Port-A-Cath site looks a little bit red.  We will give her some IV antibiotics with Zosyn and I will see about some oral antibiotics with Augmentin.  She still is on a Diflucan and Famvir.  She also has been on  Levaquin.  She is had no bleeding.  There is been no fever.  Overall, I would say performance status is ECOG 2.   Medications:  Allergies as of 10/30/2017      Reactions   Milk-related Compounds Anaphylaxis   Throat closes the patient cannot drink milk  but can eat milk products  Including cheese      Medication List        Accurate as of 10/30/17  5:23 PM. Always use your most recent med list.          acetaminophen 500 MG tablet Commonly known as:  TYLENOL Take 1,000 mg by mouth every 6 (six) hours as needed.   acyclovir 400 MG tablet Commonly known as:  ZOVIRAX Take 1 tablet (400 mg total) by mouth 2 (two) times daily.   antiseptic oral rinse Liqd 15 mLs by Mouth Rinse route every 4 (four) hours.   diphenhydrAMINE 25 MG tablet Commonly known as:  BENADRYL Take 25 mg by mouth every 6 (six) hours as needed.   famciclovir 250 MG tablet Commonly known as:  FAMVIR TAKE 1 TABLET BY MOUTH EVERY DAY   fluconazole 100 MG tablet Commonly known as:  DIFLUCAN Take 1 tablet (100 mg total) by mouth daily.   levofloxacin 250 MG tablet Commonly known as:  LEVAQUIN Take 1 tablet (250 mg total) by mouth  daily.   lidocaine-prilocaine cream Commonly known as:  EMLA Apply 1 application topically as needed. Apply to port one hour before appointment   LORazepam 0.5 MG tablet Commonly known as:  ATIVAN Take 0.5 mg by mouth every 6 (six) hours as needed (NAUSEA AND VOMITING).   LORazepam 0.5 MG tablet Commonly known as:  ATIVAN Take 1 tablet (0.5 mg total) by mouth every 6 (six) hours as needed (Nausea or vomiting).   ondansetron 8 MG tablet Commonly known as:  ZOFRAN Take 1 tablet (8 mg total) by mouth 2 (two) times daily as needed for refractory nausea / vomiting. Start on the second day after inpatient chemo completed.   prochlorperazine 10 MG tablet Commonly known as:  COMPAZINE Take 10 mg by mouth every 6 (six) hours as needed for nausea or vomiting.   prochlorperazine  10 MG tablet Commonly known as:  COMPAZINE Take 1 tablet (10 mg total) by mouth every 6 (six) hours as needed (Nausea or vomiting).   UNABLE TO FIND One Hair Prosthesis       Allergies:  Allergies  Allergen Reactions  . Milk-Related Compounds Anaphylaxis    Throat closes the patient cannot drink milk  but can eat milk products  Including cheese    Past Medical History, Surgical history, Social history, and Family History were reviewed and updated.  Review of Systems: Review of Systems  Constitutional: Positive for weight loss.  HENT: Negative.   Eyes: Negative.   Respiratory: Negative.   Cardiovascular: Negative.   Gastrointestinal: Negative.   Genitourinary: Negative.   Musculoskeletal: Negative.   Skin: Negative.   Neurological: Negative.   Endo/Heme/Allergies: Negative.   Psychiatric/Behavioral: Negative.    Marland Kitchen   Physical Exam:  weight is 93 lb 4 oz (42.3 kg). Her oral temperature is 98 F (36.7 C). Her blood pressure is 129/61 and her pulse is 86. Her respiration is 18.   Wt Readings from Last 3 Encounters:  10/30/17 93 lb 4 oz (42.3 kg)  10/30/17 93 lb 4 oz (42.3 kg)  10/25/17 95 lb (43.1 kg)    Physical Exam  Constitutional: She is oriented to person, place, and time.  HENT:  Head: Normocephalic and atraumatic.  Mouth/Throat: Oropharynx is clear and moist.  Eyes: Pupils are equal, round, and reactive to light. EOM are normal.  Neck: Normal range of motion.  Cardiovascular: Normal rate, regular rhythm and normal heart sounds.  Pulmonary/Chest: Effort normal and breath sounds normal.  Abdominal: Soft. Bowel sounds are normal.  Musculoskeletal: Normal range of motion. She exhibits no edema, tenderness or deformity.  Lymphadenopathy:    She has no cervical adenopathy.  Neurological: She is alert and oriented to person, place, and time.  Skin: Skin is warm and dry. No rash noted. No erythema.  Psychiatric: She has a normal mood and affect. Her behavior is  normal. Judgment and thought content normal.  Vitals reviewed.    Lab Results  Component Value Date   WBC 1.5 (L) 10/30/2017   HGB 8.0 (L) 10/30/2017   HCT 24.3 (L) 10/30/2017   MCV 89.0 10/30/2017   PLT 13 (L) 10/30/2017   Lab Results  Component Value Date   FERRITIN 1,608 (H) 08/23/2017   IRON 98 08/23/2017   TIBC 210 (L) 08/23/2017   UIBC 112 08/23/2017   IRONPCTSAT 47 08/23/2017   Lab Results  Component Value Date   RBC 2.73 (L) 10/30/2017   Lab Results  Component Value Date   KPAFRELGTCHN 996.8 (H) 10/24/2017  LAMBDASER <1.5 (L) 10/24/2017   KAPLAMBRATIO See below. 10/24/2017   Lab Results  Component Value Date   IGGSERUM 180 (L) 10/24/2017   IGGSERUM 179 (L) 10/24/2017   IGA 17 (L) 10/24/2017   IGA 17 (L) 10/24/2017   IGMSERUM 9 (L) 10/24/2017   IGMSERUM 8 (L) 10/24/2017   Lab Results  Component Value Date   TOTALPROTELP 5.0 (L) 10/24/2017   ALBUMINELP 2.8 (L) 10/24/2017   A1GS 0.3 10/24/2017   A2GS 0.8 10/24/2017   BETS 0.8 10/24/2017   GAMS 0.2 (L) 10/24/2017   MSPIKE 0.1 (H) 10/24/2017   SPEI Comment 10/06/2017     Chemistry      Component Value Date/Time   NA 140 10/30/2017 1055   NA 148 (H) 03/17/2017 1210   K 3.3 10/30/2017 1055   K 4.4 03/17/2017 1210   CL 109 (H) 10/30/2017 1055   CL 106 03/17/2017 1210   CO2 22 10/30/2017 1055   CO2 27 03/17/2017 1210   BUN 35 (H) 10/30/2017 1055   BUN 23 (H) 03/17/2017 1210   CREATININE 2.40 (H) 10/30/2017 1055   CREATININE 1.1 03/17/2017 1210      Component Value Date/Time   CALCIUM 8.0 10/30/2017 1055   CALCIUM 9.1 03/17/2017 1210   ALKPHOS 83 10/30/2017 1055   ALKPHOS 52 03/17/2017 1210   AST 26 10/30/2017 1055   ALT 17 10/30/2017 1055   ALT 21 03/17/2017 1210   BILITOT 0.5 10/30/2017 1055      Impression and Plan: Jeanne Martinez is a very pleasant 67 yo caucasian female with IgA kappa myeloma with 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   Hopefully, we will see her Kappa  light chain's improve further.  I do hate to say this but I would have thought that her creatinine would have been a little bit better.  This does trouble me a little bit.  I do realize that her myeloma does tend to develop resistance fairly quickly.  If her blood counts come back this week, we will see about getting her into the hospital next week for a second cycle of VD-PACE.  She still is having her blood work done 3 times a week.  I spent about 40 minutes with she and her family today.  All the time was spent face-to-face with them.  I counseled them and was helping to coordinate future care for them.  I answered all their questions.     Volanda Napoleon, MD 8/5/20195:23 PM

## 2017-10-30 NOTE — Telephone Encounter (Signed)
No LOS 8/5

## 2017-10-31 LAB — KAPPA/LAMBDA LIGHT CHAINS: KAPPA FREE LGHT CHN: 658.9 mg/L — AB (ref 3.3–19.4)

## 2017-10-31 LAB — IGG, IGA, IGM
IGM (IMMUNOGLOBULIN M), SRM: 8 mg/dL — AB (ref 26–217)
IgA: 13 mg/dL — ABNORMAL LOW (ref 87–352)
IgG (Immunoglobin G), Serum: 178 mg/dL — ABNORMAL LOW (ref 700–1600)

## 2017-11-01 ENCOUNTER — Other Ambulatory Visit: Payer: Self-pay | Admitting: Hematology & Oncology

## 2017-11-01 ENCOUNTER — Inpatient Hospital Stay: Payer: Medicare Other

## 2017-11-01 ENCOUNTER — Other Ambulatory Visit: Payer: Self-pay | Admitting: *Deleted

## 2017-11-01 ENCOUNTER — Telehealth: Payer: Self-pay | Admitting: *Deleted

## 2017-11-01 ENCOUNTER — Other Ambulatory Visit: Payer: Self-pay

## 2017-11-01 VITALS — BP 117/66 | HR 99 | Temp 97.9°F | Resp 18

## 2017-11-01 DIAGNOSIS — C9000 Multiple myeloma not having achieved remission: Secondary | ICD-10-CM | POA: Diagnosis not present

## 2017-11-01 DIAGNOSIS — Z95828 Presence of other vascular implants and grafts: Secondary | ICD-10-CM

## 2017-11-01 LAB — CMP (CANCER CENTER ONLY)
ALK PHOS: 84 U/L (ref 26–84)
ALT: 19 U/L (ref 10–47)
ANION GAP: 8 (ref 5–15)
AST: 22 U/L (ref 11–38)
Albumin: 2.7 g/dL — ABNORMAL LOW (ref 3.5–5.0)
BILIRUBIN TOTAL: 0.5 mg/dL (ref 0.2–1.6)
BUN: 32 mg/dL — ABNORMAL HIGH (ref 7–22)
CALCIUM: 7.8 mg/dL — AB (ref 8.0–10.3)
CO2: 22 mmol/L (ref 18–33)
Chloride: 109 mmol/L — ABNORMAL HIGH (ref 98–108)
Creatinine: 2.5 mg/dL — ABNORMAL HIGH (ref 0.60–1.20)
Glucose, Bld: 126 mg/dL — ABNORMAL HIGH (ref 73–118)
Potassium: 3.2 mmol/L — ABNORMAL LOW (ref 3.3–4.7)
Sodium: 139 mmol/L (ref 128–145)
TOTAL PROTEIN: 5.4 g/dL — AB (ref 6.4–8.1)

## 2017-11-01 LAB — PROTEIN ELECTROPHORESIS, SERUM, WITH REFLEX
A/G Ratio: 1.3 (ref 0.7–1.7)
ALBUMIN ELP: 2.8 g/dL — AB (ref 2.9–4.4)
ALPHA-1-GLOBULIN: 0.3 g/dL (ref 0.0–0.4)
Alpha-2-Globulin: 0.9 g/dL (ref 0.4–1.0)
Beta Globulin: 0.7 g/dL (ref 0.7–1.3)
GAMMA GLOBULIN: 0.1 g/dL — AB (ref 0.4–1.8)
Globulin, Total: 2.1 g/dL — ABNORMAL LOW (ref 2.2–3.9)
M-Spike, %: 0.1 g/dL — ABNORMAL HIGH
SPEP Interpretation: 0
TOTAL PROTEIN ELP: 4.9 g/dL — AB (ref 6.0–8.5)

## 2017-11-01 LAB — IMMUNOFIXATION REFLEX, SERUM
IGA: 13 mg/dL — AB (ref 87–352)
IGG (IMMUNOGLOBIN G), SERUM: 186 mg/dL — AB (ref 700–1600)
IgM (Immunoglobulin M), Srm: 6 mg/dL — ABNORMAL LOW (ref 26–217)

## 2017-11-01 LAB — CBC WITH DIFFERENTIAL (CANCER CENTER ONLY)
Basophils Absolute: 0 10*3/uL (ref 0.0–0.1)
Basophils Relative: 0 %
EOS PCT: 0 %
Eosinophils Absolute: 0 10*3/uL (ref 0.0–0.5)
HCT: 23.3 % — ABNORMAL LOW (ref 34.8–46.6)
Hemoglobin: 7.6 g/dL — ABNORMAL LOW (ref 11.6–15.9)
LYMPHS PCT: 5 %
Lymphs Abs: 0.1 10*3/uL — ABNORMAL LOW (ref 0.9–3.3)
MCH: 28.9 pg (ref 26.0–34.0)
MCHC: 32.6 g/dL (ref 32.0–36.0)
MCV: 88.6 fL (ref 81.0–101.0)
MONO ABS: 0.2 10*3/uL (ref 0.1–0.9)
Monocytes Relative: 6 %
Neutro Abs: 2.3 10*3/uL (ref 1.5–6.5)
Neutrophils Relative %: 89 %
PLATELETS: 5 10*3/uL — AB (ref 145–400)
RBC: 2.63 MIL/uL — ABNORMAL LOW (ref 3.70–5.32)
RDW: 15 % (ref 11.1–15.7)
WBC Count: 2.5 10*3/uL — ABNORMAL LOW (ref 3.9–10.0)

## 2017-11-01 LAB — SAMPLE TO BLOOD BANK

## 2017-11-01 MED ORDER — SODIUM CHLORIDE 0.9% FLUSH
10.0000 mL | INTRAVENOUS | Status: DC | PRN
  Administered 2017-11-01: 10 mL via INTRAVENOUS
  Filled 2017-11-01: qty 10

## 2017-11-01 MED ORDER — SODIUM CHLORIDE 0.9% IV SOLUTION
250.0000 mL | Freq: Once | INTRAVENOUS | Status: AC
  Administered 2017-11-01: 250 mL via INTRAVENOUS
  Filled 2017-11-01: qty 250

## 2017-11-01 MED ORDER — DRONABINOL 2.5 MG PO CAPS: 2.5000 mg | ORAL_CAPSULE | Freq: Three times a day (TID) | ORAL | 1 refills | Status: DC

## 2017-11-01 MED ORDER — HEPARIN SOD (PORK) LOCK FLUSH 100 UNIT/ML IV SOLN
500.0000 [IU] | Freq: Once | INTRAVENOUS | Status: AC
  Administered 2017-11-01: 500 [IU] via INTRAVENOUS
  Filled 2017-11-01: qty 5

## 2017-11-01 NOTE — Patient Instructions (Signed)

## 2017-11-01 NOTE — Patient Instructions (Signed)
Thrombocytopenia Thrombocytopenia means that you have a low number of platelets in your blood. Platelets are tiny cells in the blood. When you bleed, they clump together at the cut or injury to stop the bleeding. This is called blood clotting. Not having enough platelets can cause bleeding problems. Follow these instructions at home: General instructions  Check your skin and inside your mouth for bruises or blood as told by your doctor.  Check to see if there is blood in your spit (sputum), pee (urine), and poop (stool). Do this as told by your doctor.  Ask your doctor if you can drink alcohol.  Take over-the-counter and prescription medicines only as told by your doctor.  Tell all of your doctors that you have this condition. Be sure to tell your dentist and eye doctor too. Activity  Do not do activities that can cause bumps or bruises until your doctor says it is okay.  Be careful not to cut yourself: ? When you shave. ? When you use scissors, needles, knives, or other tools.  Be careful not to burn yourself: ? When you use an iron. ? When you cook. Contact a doctor if:  You have bruises and you do not know why. Get help right away if:  You are bleeding anywhere on your body.  You have blood in your spit, pee, or poop. This information is not intended to replace advice given to you by your health care provider. Make sure you discuss any questions you have with your health care provider. Document Released: 03/03/2011 Document Revised: 11/15/2015 Document Reviewed: 09/15/2014 Elsevier Interactive Patient Education  2018 Elsevier Inc.   

## 2017-11-01 NOTE — Telephone Encounter (Signed)
Scheduled inpatient admission for patient for Monday, August 12th to inpatient oncology unit. Unit assistance director also notified of planned admission.

## 2017-11-02 ENCOUNTER — Ambulatory Visit: Payer: Medicare (Managed Care) | Admitting: Hematology & Oncology

## 2017-11-02 ENCOUNTER — Telehealth: Payer: Self-pay | Admitting: *Deleted

## 2017-11-02 ENCOUNTER — Other Ambulatory Visit: Payer: Self-pay | Admitting: Hematology & Oncology

## 2017-11-02 ENCOUNTER — Other Ambulatory Visit: Payer: Medicare (Managed Care)

## 2017-11-02 DIAGNOSIS — C9000 Multiple myeloma not having achieved remission: Secondary | ICD-10-CM

## 2017-11-02 LAB — BPAM PLATELET PHERESIS
Blood Product Expiration Date: 201908082359
ISSUE DATE / TIME: 201908071056
UNIT TYPE AND RH: 6200

## 2017-11-02 LAB — PREPARE PLATELET PHERESIS: Unit division: 0

## 2017-11-02 NOTE — Telephone Encounter (Signed)
Patient has a conjunctival hemorrhage.  She had platelet infusion yesterday and will be back in the office tomorrow for labs.  Reviewed symptom with Dr Marin Olp. We will follow up and assess patient when in the office tomorrow.

## 2017-11-03 ENCOUNTER — Inpatient Hospital Stay: Payer: Medicare Other

## 2017-11-03 ENCOUNTER — Other Ambulatory Visit: Payer: Self-pay

## 2017-11-03 ENCOUNTER — Inpatient Hospital Stay (HOSPITAL_BASED_OUTPATIENT_CLINIC_OR_DEPARTMENT_OTHER): Payer: Medicare Other | Admitting: Hematology & Oncology

## 2017-11-03 ENCOUNTER — Telehealth: Payer: Self-pay

## 2017-11-03 ENCOUNTER — Encounter: Payer: Self-pay | Admitting: Hematology & Oncology

## 2017-11-03 VITALS — BP 115/79 | HR 100 | Temp 98.4°F | Resp 16 | Wt 91.8 lb

## 2017-11-03 DIAGNOSIS — R634 Abnormal weight loss: Secondary | ICD-10-CM | POA: Diagnosis not present

## 2017-11-03 DIAGNOSIS — D649 Anemia, unspecified: Secondary | ICD-10-CM

## 2017-11-03 DIAGNOSIS — C9000 Multiple myeloma not having achieved remission: Secondary | ICD-10-CM

## 2017-11-03 DIAGNOSIS — N179 Acute kidney failure, unspecified: Secondary | ICD-10-CM | POA: Diagnosis not present

## 2017-11-03 LAB — CBC WITH DIFFERENTIAL (CANCER CENTER ONLY)
BASOS ABS: 0 10*3/uL (ref 0.0–0.1)
Basophils Relative: 0 %
Eosinophils Absolute: 0 10*3/uL (ref 0.0–0.5)
Eosinophils Relative: 0 %
HCT: 21.9 % — ABNORMAL LOW (ref 34.8–46.6)
HEMOGLOBIN: 7.2 g/dL — AB (ref 11.6–15.9)
LYMPHS ABS: 0.2 10*3/uL — AB (ref 0.9–3.3)
LYMPHS PCT: 4 %
MCH: 29.1 pg (ref 26.0–34.0)
MCHC: 32.9 g/dL (ref 32.0–36.0)
MCV: 88.7 fL (ref 81.0–101.0)
Monocytes Absolute: 0.2 10*3/uL (ref 0.1–0.9)
Monocytes Relative: 6 %
NEUTROS PCT: 90 %
Neutro Abs: 3.2 10*3/uL (ref 1.5–6.5)
Platelet Count: 15 10*3/uL — ABNORMAL LOW (ref 145–400)
RBC: 2.47 MIL/uL — ABNORMAL LOW (ref 3.70–5.32)
RDW: 15 % (ref 11.1–15.7)
WBC: 3.6 10*3/uL — AB (ref 3.9–10.0)

## 2017-11-03 LAB — SAMPLE TO BLOOD BANK

## 2017-11-03 LAB — CMP (CANCER CENTER ONLY)
ALK PHOS: 91 U/L — AB (ref 26–84)
ALT: 25 U/L (ref 10–47)
ANION GAP: 5 (ref 5–15)
AST: 26 U/L (ref 11–38)
Albumin: 2.8 g/dL — ABNORMAL LOW (ref 3.5–5.0)
BUN: 26 mg/dL — AB (ref 7–22)
CALCIUM: 7.9 mg/dL — AB (ref 8.0–10.3)
CO2: 25 mmol/L (ref 18–33)
Chloride: 107 mmol/L (ref 98–108)
Creatinine: 2.4 mg/dL — ABNORMAL HIGH (ref 0.60–1.20)
Glucose, Bld: 118 mg/dL (ref 73–118)
POTASSIUM: 3.2 mmol/L — AB (ref 3.3–4.7)
SODIUM: 137 mmol/L (ref 128–145)
TOTAL PROTEIN: 5.6 g/dL — AB (ref 6.4–8.1)
Total Bilirubin: 0.5 mg/dL (ref 0.2–1.6)

## 2017-11-03 MED ORDER — HEPARIN SOD (PORK) LOCK FLUSH 100 UNIT/ML IV SOLN
500.0000 [IU] | Freq: Once | INTRAVENOUS | Status: AC
  Administered 2017-11-03: 500 [IU] via INTRAVENOUS
  Filled 2017-11-03: qty 5

## 2017-11-03 MED ORDER — SODIUM CHLORIDE 0.9% FLUSH
10.0000 mL | INTRAVENOUS | Status: DC | PRN
  Administered 2017-11-03: 10 mL via INTRAVENOUS
  Filled 2017-11-03: qty 10

## 2017-11-03 NOTE — Telephone Encounter (Signed)
Dr Marin Olp aware of critical low hgb & platelets. No new orders at this time. dph

## 2017-11-03 NOTE — Progress Notes (Signed)
Hematology and Oncology Follow Up Visit  ALEYNA CUEVA [id number] March 27, 1951 67 y.o. 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 Lentz is here today with her family for follow-up.  So far, she really has done well with the VD-PACE.  It has worked tremendously well.  Her kappa light chain level went from 21,200 down to 1000.  I am so happy about this.  I spoke with Dr. Zonia Kief from [facility].  They do have a CAR-T protocol for myeloma.  I think this would really be beneficial for Ms. Rybarczyk.  I talked to she and her family about this.  Dr. Rosalita Levan feels that a second cycle of VD-PACE would be helpful before doing any CAR-T therapy.  Hopefully, her renal function will improve.  Her hair is starting to come out now.  Her blood counts are starting to come up, thankfully.  I do not think we have to transfuse her right now.  She still only weighs 93 pounds.  Her family is trying to get her to eat a little bit more.  She has had no nausea or vomiting.  There is been no mouth sores.  She has had no diarrhea.  Para she has had quite a bit of leg swelling which might be from the steroids and also from her being anemic.  She does have a little bit dehydrated today.  I will give her some fluids.  The Port-A-Cath site looks a little bit red.  We will give her some IV antibiotics with Zosyn and I will see about some oral antibiotics with Augmentin.  She still is on a Diflucan and Famvir.  She also has been on  Levaquin.  She is had no bleeding.  There is been no fever.  Overall, I would say performance status is ECOG 2.   Medications:  Allergies as of 11/03/2017      Reactions   Milk-related Compounds Anaphylaxis   Throat closes the patient cannot drink milk  but can eat milk products  Including cheese      Medication List        Accurate as of 11/03/17 11:10 AM. Always use your most recent med list.          acetaminophen 500 MG tablet Commonly known as:  TYLENOL Take 1,000 mg by mouth every 6 (six) hours as needed.   acyclovir 400 MG tablet Commonly known as:  ZOVIRAX Take 1 tablet (400 mg total) by mouth 2 (two) times daily.   amoxicillin-clavulanate 875-125 MG tablet Commonly known as:  AUGMENTIN Take 1 tablet by mouth daily for 7 days.   antiseptic oral rinse Liqd 15 mLs by Mouth Rinse route every 4 (four) hours.   diphenhydrAMINE 25 MG tablet Commonly known as:  BENADRYL Take 25 mg by mouth every 6 (six) hours as needed.   dronabinol 2.5 MG capsule Commonly known as:  MARINOL Take 1 capsule (2.5 mg total) by mouth 3 (three) times daily before meals.   famciclovir 250 MG tablet Commonly known as:  FAMVIR TAKE 1  TABLET BY MOUTH EVERY DAY   fluconazole 100 MG tablet Commonly known as:  DIFLUCAN Take 1 tablet (100 mg total) by mouth daily.   levofloxacin 250 MG tablet Commonly known as:  LEVAQUIN Take 1 tablet (250 mg total) by mouth daily.   lidocaine-prilocaine cream Commonly known as:  EMLA Apply 1 application topically as needed. Apply to port one hour before appointment   LORazepam 0.5 MG tablet Commonly known as:  ATIVAN Take 0.5 mg by mouth every 6 (six) hours as needed (NAUSEA AND VOMITING).   LORazepam 0.5 MG tablet Commonly known as:  ATIVAN Take 1 tablet (0.5 mg total) by mouth every 6 (six) hours as needed (Nausea or vomiting).   ondansetron 8 MG tablet Commonly known as:  ZOFRAN Take 1 tablet (8 mg total) by mouth 2 (two) times daily as  needed for refractory nausea / vomiting. Start on the second day after inpatient chemo completed.   prochlorperazine 10 MG tablet Commonly known as:  COMPAZINE Take 10 mg by mouth every 6 (six) hours as needed for nausea or vomiting.   prochlorperazine 10 MG tablet Commonly known as:  COMPAZINE Take 1 tablet (10 mg total) by mouth every 6 (six) hours as needed (Nausea or vomiting).   risedronate 35 MG tablet Commonly known as:  ACTONEL Take 35 mg by mouth.   UNABLE TO FIND One Hair Prosthesis       Allergies:  Allergies  Allergen Reactions  . Milk-Related Compounds Anaphylaxis    Throat closes the patient cannot drink milk  but can eat milk products  Including cheese    Past Medical History, Surgical history, Social history, and Family History were reviewed and updated.  Review of Systems: Review of Systems  Constitutional: Positive for weight loss.  HENT: Negative.   Eyes: Negative.   Respiratory: Negative.   Cardiovascular: Negative.   Gastrointestinal: Negative.   Genitourinary: Negative.   Musculoskeletal: Negative.   Skin: Negative.   Neurological: Negative.   Endo/Heme/Allergies: Negative.   Psychiatric/Behavioral: Negative.    Marland Kitchen   Physical Exam:  weight is 91 lb 12 oz (41.6 kg). Her oral temperature is 98.4 F (36.9 C). Her blood pressure is 115/79 and her pulse is 100. Her respiration is 16 and oxygen saturation is 100%.   Wt Readings from Last 3 Encounters:  11/03/17 91 lb 12 oz (41.6 kg)  10/30/17 93 lb 4 oz (42.3 kg)  10/30/17 93 lb 4 oz (42.3 kg)    Physical Exam  Constitutional: She is oriented to person, place, and time.  HENT:  Head: Normocephalic and atraumatic.  Mouth/Throat: Oropharynx is clear and moist.  Eyes: Pupils are equal, round, and reactive to light. EOM are normal.  Neck: Normal range of motion.  Cardiovascular: Normal rate, regular rhythm and normal heart sounds.  Pulmonary/Chest: Effort normal and breath sounds normal.    Abdominal: Soft. Bowel sounds are normal.  Musculoskeletal: Normal range of motion. She exhibits no edema, tenderness or deformity.  Lymphadenopathy:    She has no cervical adenopathy.  Neurological: She is alert and oriented to person, place, and time.  Skin: Skin is warm and dry. No rash noted. No erythema.  Psychiatric: She has a normal mood and affect. Her behavior is normal. Judgment and thought content normal.  Vitals reviewed.    Lab Results  Component Value Date   WBC 3.6 (L) 11/03/2017   HGB 7.2 (L) 11/03/2017   HCT 21.9 (L) 11/03/2017   MCV 88.7 11/03/2017  PLT 15 (L) 11/03/2017   Lab Results  Component Value Date   FERRITIN 1,608 (H) 08/23/2017   IRON 98 08/23/2017   TIBC 210 (L) 08/23/2017   UIBC 112 08/23/2017   IRONPCTSAT 47 08/23/2017   Lab Results  Component Value Date   RBC 2.47 (L) 11/03/2017   Lab Results  Component Value Date   KPAFRELGTCHN 658.9 (H) 10/30/2017   LAMBDASER <1.5 (L) 10/30/2017   KAPLAMBRATIO See below. 10/30/2017   Lab Results  Component Value Date   IGGSERUM 178 (L) 10/30/2017   IGGSERUM 186 (L) 10/30/2017   IGA 13 (L) 10/30/2017   IGA 13 (L) 10/30/2017   IGMSERUM 8 (L) 10/30/2017   IGMSERUM 6 (L) 10/30/2017   Lab Results  Component Value Date   TOTALPROTELP 4.9 (L) 10/30/2017   ALBUMINELP 2.8 (L) 10/30/2017   A1GS 0.3 10/30/2017   A2GS 0.9 10/30/2017   BETS 0.7 10/30/2017   GAMS 0.1 (L) 10/30/2017   MSPIKE 0.1 (H) 10/30/2017   SPEI Comment 10/06/2017     Chemistry      Component Value Date/Time   NA 137 11/03/2017 0950   NA 148 (H) 03/17/2017 1210   K 3.2 (L) 11/03/2017 0950   K 4.4 03/17/2017 1210   CL 107 11/03/2017 0950   CL 106 03/17/2017 1210   CO2 25 11/03/2017 0950   CO2 27 03/17/2017 1210   BUN 26 (H) 11/03/2017 0950   BUN 23 (H) 03/17/2017 1210   CREATININE 2.40 (H) 11/03/2017 0950   CREATININE 1.1 03/17/2017 1210      Component Value Date/Time   CALCIUM 7.9 (L) 11/03/2017 0950   CALCIUM 9.1  03/17/2017 1210   ALKPHOS 91 (H) 11/03/2017 0950   ALKPHOS 52 03/17/2017 1210   AST 26 11/03/2017 0950   ALT 25 11/03/2017 0950   ALT 21 03/17/2017 1210   BILITOT 0.5 11/03/2017 0950      Impression and Plan: Ms. Lorio is a very pleasant 67 yo caucasian female with IgA kappa myeloma with 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   I am so happy that her light chains came down so well with just one cycle of treatment.  We will get her admitted to [facility] on August 12.  We will give her the second cycle of VD-PACE.  Hopefully, she will qualify for CAR-T therapy.  My concern is that she will be ineligible because of her weight.  I keep trying to get her to eat more.  She is supposed be on Marinol.  However, her insurance company is not approving this.  We will get this to her in the hospital.  I think that she will be in the hospital for 1 week.  I will plan to see her back in the office in about 10 days.   Volanda Napoleon, MD 8/9/201911:10 AM

## 2017-11-03 NOTE — Patient Instructions (Signed)
Implanted Port Insertion, Care After °This sheet gives you information about how to care for yourself after your procedure. Your health care provider may also give you more specific instructions. If you have problems or questions, contact your health care provider. °What can I expect after the procedure? °After your procedure, it is common to have: °· Discomfort at the port insertion site. °· Bruising on the skin over the port. This should improve over 3-4 days. ° °Follow these instructions at home: °Port care °· After your port is placed, you will get a manufacturer's information card. The card has information about your port. Keep this card with you at all times. °· Take care of the port as told by your health care provider. Ask your health care provider if you or a family member can get training for taking care of the port at home. A home health care nurse may also take care of the port. °· Make sure to remember what type of port you have. °Incision care °· Follow instructions from your health care provider about how to take care of your port insertion site. Make sure you: °? Wash your hands with soap and water before you change your bandage (dressing). If soap and water are not available, use hand sanitizer. °? Change your dressing as told by your health care provider. °? Leave stitches (sutures), skin glue, or adhesive strips in place. These skin closures may need to stay in place for 2 weeks or longer. If adhesive strip edges start to loosen and curl up, you may trim the loose edges. Do not remove adhesive strips completely unless your health care provider tells you to do that. °· Check your port insertion site every day for signs of infection. Check for: °? More redness, swelling, or pain. °? More fluid or blood. °? Warmth. °? Pus or a bad smell. °General instructions °· Do not take baths, swim, or use a hot tub until your health care provider approves. °· Do not lift anything that is heavier than 10 lb (4.5  kg) for a week, or as told by your health care provider. °· Ask your health care provider when it is okay to: °? Return to work or school. °? Resume usual physical activities or sports. °· Do not drive for 24 hours if you were given a medicine to help you relax (sedative). °· Take over-the-counter and prescription medicines only as told by your health care provider. °· Wear a medical alert bracelet in case of an emergency. This will tell any health care providers that you have a port. °· Keep all follow-up visits as told by your health care provider. This is important. °Contact a health care provider if: °· You cannot flush your port with saline as directed, or you cannot draw blood from the port. °· You have a fever or chills. °· You have more redness, swelling, or pain around your port insertion site. °· You have more fluid or blood coming from your port insertion site. °· Your port insertion site feels warm to the touch. °· You have pus or a bad smell coming from the port insertion site. °Get help right away if: °· You have chest pain or shortness of breath. °· You have bleeding from your port that you cannot control. °Summary °· Take care of the port as told by your health care provider. °· Change your dressing as told by your health care provider. °· Keep all follow-up visits as told by your health care provider. °  This information is not intended to replace advice given to you by your health care provider. Make sure you discuss any questions you have with your health care provider. °Document Released: 01/02/2013 Document Revised: 02/03/2016 Document Reviewed: 02/03/2016 °Elsevier Interactive Patient Education © 2017 [employer] ° °

## 2017-11-06 ENCOUNTER — Encounter (HOSPITAL_COMMUNITY): Payer: Self-pay

## 2017-11-06 ENCOUNTER — Inpatient Hospital Stay (HOSPITAL_COMMUNITY)
Admission: RE | Admit: 2017-11-06 | Discharge: 2017-11-10 | DRG: 846 | Disposition: A | Discharge status: Home / Self Care | Payer: Medicare Other | Source: Ambulatory Visit | Attending: Hematology & Oncology | Admitting: Hematology & Oncology

## 2017-11-06 ENCOUNTER — Other Ambulatory Visit: Payer: Medicare Other

## 2017-11-06 ENCOUNTER — Other Ambulatory Visit: Payer: Self-pay

## 2017-11-06 ENCOUNTER — Inpatient Hospital Stay (HOSPITAL_COMMUNITY): Payer: Medicare Other

## 2017-11-06 DIAGNOSIS — D8989 Other specified disorders involving the immune mechanism, not elsewhere classified: Secondary | ICD-10-CM

## 2017-11-06 DIAGNOSIS — C9000 Multiple myeloma not having achieved remission: Secondary | ICD-10-CM

## 2017-11-06 DIAGNOSIS — Z79899 Other long term (current) drug therapy: Secondary | ICD-10-CM | POA: Diagnosis not present

## 2017-11-06 DIAGNOSIS — I129 Hypertensive chronic kidney disease with stage 1 through stage 4 chronic kidney disease, or unspecified chronic kidney disease: Secondary | ICD-10-CM | POA: Diagnosis present

## 2017-11-06 DIAGNOSIS — D61810 Antineoplastic chemotherapy induced pancytopenia: Secondary | ICD-10-CM | POA: Diagnosis present

## 2017-11-06 DIAGNOSIS — Z5111 Encounter for antineoplastic chemotherapy: Secondary | ICD-10-CM | POA: Diagnosis present

## 2017-11-06 DIAGNOSIS — T451X5A Adverse effect of antineoplastic and immunosuppressive drugs, initial encounter: Secondary | ICD-10-CM | POA: Diagnosis present

## 2017-11-06 DIAGNOSIS — F0390 Unspecified dementia without behavioral disturbance: Secondary | ICD-10-CM | POA: Diagnosis present

## 2017-11-06 DIAGNOSIS — D631 Anemia in chronic kidney disease: Secondary | ICD-10-CM | POA: Diagnosis present

## 2017-11-06 DIAGNOSIS — N183 Chronic kidney disease, stage 3 (moderate): Secondary | ICD-10-CM | POA: Diagnosis present

## 2017-11-06 DIAGNOSIS — E876 Hypokalemia: Secondary | ICD-10-CM

## 2017-11-06 LAB — COMPREHENSIVE METABOLIC PANEL
ALT: 19 U/L (ref 0–44)
ANION GAP: 10 (ref 5–15)
AST: 20 U/L (ref 15–41)
Albumin: 3 g/dL — ABNORMAL LOW (ref 3.5–5.0)
Alkaline Phosphatase: 73 U/L (ref 38–126)
BUN: 37 mg/dL — ABNORMAL HIGH (ref 8–23)
CO2: 23 mmol/L (ref 22–32)
CREATININE: 2.7 mg/dL — AB (ref 0.44–1.00)
Calcium: 7.6 mg/dL — ABNORMAL LOW (ref 8.9–10.3)
Chloride: 108 mmol/L (ref 98–111)
GFR, EST AFRICAN AMERICAN: 20 mL/min — AB (ref >60)
GFR, EST NON AFRICAN AMERICAN: 17 mL/min — AB (ref >60)
Glucose, Bld: 99 mg/dL (ref 70–99)
Potassium: 3.3 mmol/L — ABNORMAL LOW (ref 3.5–5.1)
Sodium: 141 mmol/L (ref 135–145)
Total Bilirubin: 0.4 mg/dL (ref 0.3–1.2)
Total Protein: 5.4 g/dL — ABNORMAL LOW (ref 6.5–8.1)

## 2017-11-06 LAB — URINALYSIS, ROUTINE W REFLEX MICROSCOPIC
BILIRUBIN URINE: NEGATIVE
Glucose, UA: NEGATIVE mg/dL
HGB URINE DIPSTICK: NEGATIVE
KETONES UR: NEGATIVE mg/dL
Leukocytes, UA: NEGATIVE
Nitrite: NEGATIVE
PH: 6 (ref 5.0–8.0)
Protein, ur: NEGATIVE mg/dL
SPECIFIC GRAVITY, URINE: 1.009 (ref 1.005–1.030)

## 2017-11-06 LAB — PHOSPHORUS: PHOSPHORUS: 4.3 mg/dL (ref 2.5–4.6)

## 2017-11-06 LAB — CBC
HCT: 19.8 % — ABNORMAL LOW (ref 36.0–46.0)
Hemoglobin: 6.8 g/dL — CL (ref 12.0–15.0)
MCH: 29.1 pg (ref 26.0–34.0)
MCHC: 34.3 g/dL (ref 30.0–36.0)
MCV: 84.6 fL (ref 78.0–100.0)
Platelets: 14 10*3/uL — CL (ref 150–400)
RBC: 2.34 MIL/uL — AB (ref 3.87–5.11)
RDW: 15.5 % (ref 11.5–15.5)
WBC: 3.1 10*3/uL — ABNORMAL LOW (ref 4.0–10.5)

## 2017-11-06 LAB — MAGNESIUM: MAGNESIUM: 1.2 mg/dL — AB (ref 1.7–2.4)

## 2017-11-06 LAB — PROTIME-INR
INR: 1.09
PROTHROMBIN TIME: 14 s (ref 11.4–15.2)

## 2017-11-06 LAB — APTT: aPTT: 28 seconds (ref 24–36)

## 2017-11-06 MED ORDER — BIOTENE DRY MOUTH MT LIQD
15.0000 mL | OROMUCOSAL | Status: DC
  Administered 2017-11-06 – 2017-11-10 (×16): 15 mL via OROMUCOSAL

## 2017-11-06 MED ORDER — POTASSIUM CHLORIDE 2 MEQ/ML IV SOLN
Freq: Once | INTRAVENOUS | Status: AC
  Administered 2017-11-06: 16:00:00 via INTRAVENOUS
  Filled 2017-11-06: qty 10

## 2017-11-06 MED ORDER — FOLIC ACID 1 MG PO TABS
1.0000 mg | ORAL_TABLET | Freq: Every day | ORAL | Status: DC
  Administered 2017-11-06 – 2017-11-10 (×5): 1 mg via ORAL
  Filled 2017-11-06 (×5): qty 1

## 2017-11-06 MED ORDER — DEXAMETHASONE 4 MG PO TABS
40.0000 mg | ORAL_TABLET | Freq: Once | ORAL | Status: AC
  Administered 2017-11-06: 40 mg via ORAL
  Filled 2017-11-06: qty 10

## 2017-11-06 MED ORDER — FLUCONAZOLE 100 MG PO TABS
100.0000 mg | ORAL_TABLET | Freq: Every day | ORAL | Status: DC
  Administered 2017-11-07 – 2017-11-10 (×4): 100 mg via ORAL
  Filled 2017-11-06 (×4): qty 1

## 2017-11-06 MED ORDER — ADULT MULTIVITAMIN W/MINERALS CH
1.0000 | ORAL_TABLET | Freq: Every day | ORAL | Status: DC
  Administered 2017-11-06 – 2017-11-10 (×5): 1 via ORAL
  Filled 2017-11-06 (×5): qty 1

## 2017-11-06 MED ORDER — SODIUM CHLORIDE 0.9 % IV SOLN: INTRAVENOUS | Status: DC

## 2017-11-06 MED ORDER — VITAMIN B-1 100 MG PO TABS
100.0000 mg | ORAL_TABLET | Freq: Every day | ORAL | Status: DC
  Administered 2017-11-06 – 2017-11-10 (×5): 100 mg via ORAL
  Filled 2017-11-06 (×5): qty 1

## 2017-11-06 MED ORDER — PROCHLORPERAZINE MALEATE 10 MG PO TABS: 10.0000 mg | ORAL_TABLET | Freq: Four times a day (QID) | ORAL | Status: DC | PRN

## 2017-11-06 MED ORDER — PALONOSETRON HCL INJECTION 0.25 MG/5ML
0.2500 mg | Freq: Once | INTRAVENOUS | Status: AC
  Administered 2017-11-06: 0.25 mg via INTRAVENOUS
  Filled 2017-11-06: qty 5

## 2017-11-06 MED ORDER — SODIUM CHLORIDE 0.9 % IV SOLN
Freq: Once | INTRAVENOUS | Status: AC
  Administered 2017-11-06: 28 mg via INTRAVENOUS
  Filled 2017-11-06 (×2): qty 7

## 2017-11-06 MED ORDER — FOSAPREPITANT DIMEGLUMINE INJECTION 150 MG
Freq: Once | INTRAVENOUS | Status: AC
  Administered 2017-11-06: 16:00:00 via INTRAVENOUS
  Filled 2017-11-06: qty 5

## 2017-11-06 MED ORDER — FAMCICLOVIR 500 MG PO TABS: 250.0000 mg | ORAL_TABLET | Freq: Every day | ORAL | Status: DC

## 2017-11-06 MED ORDER — ONDANSETRON HCL 4 MG/2ML IJ SOLN
4.0000 mg | Freq: Four times a day (QID) | INTRAMUSCULAR | Status: DC | PRN
  Administered 2017-11-06: 4 mg via INTRAVENOUS
  Filled 2017-11-06: qty 2

## 2017-11-06 MED ORDER — LORAZEPAM 0.5 MG PO TABS: 0.5000 mg | ORAL_TABLET | Freq: Four times a day (QID) | ORAL | Status: DC | PRN

## 2017-11-06 MED ORDER — ACYCLOVIR 200 MG PO CAPS
400.0000 mg | ORAL_CAPSULE | Freq: Two times a day (BID) | ORAL | Status: DC
  Administered 2017-11-06 – 2017-11-10 (×9): 400 mg via ORAL
  Filled 2017-11-06 (×10): qty 2

## 2017-11-06 MED ORDER — ACYCLOVIR 400 MG PO TABS: 400.0000 mg | ORAL_TABLET | Freq: Two times a day (BID) | ORAL | Status: DC

## 2017-11-06 MED ORDER — BORTEZOMIB CHEMO IV INJECTION 3.5 MG(1MG/ML)
1.3000 mg/m2 | Freq: Once | INTRAMUSCULAR | Status: AC
  Administered 2017-11-06: 1.8 mg via INTRAVENOUS
  Filled 2017-11-06: qty 1.8

## 2017-11-06 MED ORDER — SODIUM CHLORIDE 0.9 % IV SOLN
7.5000 mg/m2 | Freq: Once | INTRAVENOUS | Status: AC
  Administered 2017-11-06: 10 mg via INTRAVENOUS
  Filled 2017-11-06 (×2): qty 5

## 2017-11-06 MED ORDER — ONDANSETRON HCL 8 MG PO TABS: 8.0000 mg | ORAL_TABLET | Freq: Two times a day (BID) | ORAL | Status: DC | PRN

## 2017-11-06 MED ORDER — DRONABINOL 2.5 MG PO CAPS
2.5000 mg | ORAL_CAPSULE | Freq: Three times a day (TID) | ORAL | Status: DC
  Administered 2017-11-06 – 2017-11-10 (×11): 2.5 mg via ORAL
  Filled 2017-11-06 (×11): qty 1

## 2017-11-06 NOTE — Progress Notes (Signed)
Chemotherapy dosage verified with Jean Wolf, RN.  

## 2017-11-06 NOTE — Progress Notes (Signed)
CRITICAL VALUE ALERT  Critical Value:  HGB 6.8 PLT 14  Date & Time Notied:  11/06/2017   Provider Notified: Dr Marin Olp  Orders Received/Actions taken:

## 2017-11-07 ENCOUNTER — Other Ambulatory Visit: Payer: Medicare Other

## 2017-11-07 DIAGNOSIS — Z79899 Other long term (current) drug therapy: Secondary | ICD-10-CM

## 2017-11-07 DIAGNOSIS — F0390 Unspecified dementia without behavioral disturbance: Secondary | ICD-10-CM

## 2017-11-07 DIAGNOSIS — N183 Chronic kidney disease, stage 3 (moderate): Secondary | ICD-10-CM

## 2017-11-07 DIAGNOSIS — C9000 Multiple myeloma not having achieved remission: Secondary | ICD-10-CM

## 2017-11-07 LAB — CBC WITH DIFFERENTIAL/PLATELET
BASOS ABS: 0 10*3/uL (ref 0.0–0.1)
Basophils Relative: 0 %
EOS ABS: 0 10*3/uL (ref 0.0–0.7)
Eosinophils Relative: 0 %
HCT: 16.7 % — ABNORMAL LOW (ref 36.0–46.0)
HEMOGLOBIN: 5.7 g/dL — AB (ref 12.0–15.0)
LYMPHS PCT: 4 %
Lymphs Abs: 0.1 10*3/uL — ABNORMAL LOW (ref 0.7–4.0)
MCH: 29.1 pg (ref 26.0–34.0)
MCHC: 34.1 g/dL (ref 30.0–36.0)
MCV: 85.2 fL (ref 78.0–100.0)
Monocytes Absolute: 0.1 10*3/uL (ref 0.1–1.0)
Monocytes Relative: 3 %
NEUTROS PCT: 93 %
Neutro Abs: 2.3 10*3/uL (ref 1.7–7.7)
Platelets: 14 10*3/uL — CL (ref 150–400)
RBC: 1.96 MIL/uL — AB (ref 3.87–5.11)
RDW: 15.5 % (ref 11.5–15.5)
WBC: 2.5 10*3/uL — AB (ref 4.0–10.5)

## 2017-11-07 LAB — COMPREHENSIVE METABOLIC PANEL
ALK PHOS: 68 U/L (ref 38–126)
ALT: 15 U/L (ref 0–44)
AST: 19 U/L (ref 15–41)
Albumin: 2.8 g/dL — ABNORMAL LOW (ref 3.5–5.0)
Anion gap: 11 (ref 5–15)
BUN: 36 mg/dL — AB (ref 8–23)
CALCIUM: 7.3 mg/dL — AB (ref 8.9–10.3)
CHLORIDE: 110 mmol/L (ref 98–111)
CO2: 20 mmol/L — AB (ref 22–32)
CREATININE: 2.25 mg/dL — AB (ref 0.44–1.00)
GFR calc Af Amer: 25 mL/min — ABNORMAL LOW (ref >60)
GFR calc non Af Amer: 21 mL/min — ABNORMAL LOW (ref >60)
GLUCOSE: 162 mg/dL — AB (ref 70–99)
Potassium: 3.4 mmol/L — ABNORMAL LOW (ref 3.5–5.1)
SODIUM: 141 mmol/L (ref 135–145)
Total Bilirubin: 0.4 mg/dL (ref 0.3–1.2)
Total Protein: 5 g/dL — ABNORMAL LOW (ref 6.5–8.1)

## 2017-11-07 LAB — PREPARE RBC (CROSSMATCH)

## 2017-11-07 MED ORDER — DEXAMETHASONE 4 MG PO TABS
40.0000 mg | ORAL_TABLET | Freq: Once | ORAL | Status: AC
  Administered 2017-11-07: 40 mg via ORAL
  Filled 2017-11-07: qty 10

## 2017-11-07 MED ORDER — SODIUM CHLORIDE 0.9 % IV SOLN
Freq: Once | INTRAVENOUS | Status: AC
  Administered 2017-11-07: 1000 mg via INTRAVENOUS
  Filled 2017-11-07: qty 7

## 2017-11-07 MED ORDER — SODIUM CHLORIDE 0.9 % IV SOLN
7.5000 mg/m2 | Freq: Once | INTRAVENOUS | Status: AC
  Administered 2017-11-07: 10 mg via INTRAVENOUS
  Filled 2017-11-07: qty 5

## 2017-11-07 MED ORDER — FUROSEMIDE 10 MG/ML IJ SOLN
30.0000 mg | Freq: Once | INTRAMUSCULAR | Status: AC
  Administered 2017-11-08: 30 mg via INTRAVENOUS
  Filled 2017-11-07: qty 4

## 2017-11-07 MED ORDER — POTASSIUM CHLORIDE 2 MEQ/ML IV SOLN
Freq: Once | INTRAVENOUS | Status: AC
  Administered 2017-11-07: 14:00:00 via INTRAVENOUS
  Filled 2017-11-07: qty 10

## 2017-11-07 MED ORDER — SODIUM CHLORIDE 0.9% IV SOLUTION
Freq: Once | INTRAVENOUS | Status: AC
  Administered 2017-11-07: 18:00:00 via INTRAVENOUS

## 2017-11-07 NOTE — H&P (Signed)
Referral MD  Reason for Referral: Admission for second cycle of VD-PACE for refractory kappa light chain myeloma  No chief complaint on file. : I am here for chemotherapy.  HPI: Jeanne Martinez is a very nice 67 year old white female.  She has refractory kappa light chain myeloma.  She has had one cycle of VD PACE.  She responded incredibly well to treatment.  Her kappa light chain went down from 21,000 down to 600 mg/L.  She actually tolerated treatment well.  She has had transfusions because of cytopenia from the chemotherapy.  She has been on prophylactic antibiotics with Levaquin, Diflucan, and Famvir.  She had no nausea or vomiting.  She is still not eating as much as we would like.  She has been admitted for her second cycle of chemotherapy.  After this cycle of chemotherapy, we will try to get her down to [facility] to see if she is eligible for CAR-T therapy.  She has had decreased swelling in her legs.  She has not had diarrhea.  She has had no cough.  She had a chest x-ray today.  There is no pulmonary edema.  She had some compression fractures.  She has had no rashes.  She has had no mouth sores.  Overall, her performance status is ECOG 1-2.     Past Medical History:  Diagnosis Date  . Anemia of chronic renal failure, stage 3 (moderate) ([place]) 08/08/2017  . Cancer ([place])    multiple myeloma  . Counseling regarding goals of care 08/08/2017  . Hypertension    patient denies  . Kappa light chain deposition disease ([place]) 08/08/2017  . Renal disorder    acute renal failure due to myeloma  :  Past Surgical History:  Procedure Laterality Date  . IR IMAGING GUIDED PORT INSERTION  09/19/2017  . NO PAST SURGERIES    :   Current Facility-Administered Medications:  .  0.9 %  sodium chloride infusion, , Intravenous, Continuous, Hurschel Paynter, Rudell Cobb, MD .  acyclovir (ZOVIRAX) 200 MG capsule 400 mg, 400 mg, Oral, BID, Volanda Napoleon, MD, 400 mg at 11/06/17 2145 .  antiseptic oral  rinse (BIOTENE) solution 15 mL, 15 mL, Mouth Rinse, Q4H, Aadit Hagood, Rudell Cobb, MD, 15 mL at 11/07/17 0542 .  CISplatin (PLATINOL) 7 mg, cyclophosphamide (CYTOXAN) 560 mg, etoposide (VEPESID) 28 mg in sodium chloride 0.9 % 1,000 mL chemo infusion, , Intravenous, Once, Volanda Napoleon, MD, Last Rate: 47 mL/hr at 11/06/17 1704 .  DOXOrubicin (ADRIAMYCIN) 10 mg in sodium chloride 0.9 % 1,000 mL chemo infusion, 7.5 mg/m2 (Treatment Plan Recorded), Intravenous, Once, Volanda Napoleon, MD, Last Rate: 46 mL/hr at 11/06/17 1704 .  dronabinol (MARINOL) capsule 2.5 mg, 2.5 mg, Oral, TID AC, Volanda Napoleon, MD, 2.5 mg at 11/06/17 1647 .  fluconazole (DIFLUCAN) tablet 100 mg, 100 mg, Oral, Daily, Liticia Gasior, Rudell Cobb, MD .  folic acid (FOLVITE) tablet 1 mg, 1 mg, Oral, Daily, Levern Pitter, Rudell Cobb, MD, 1 mg at 11/06/17 1226 .  LORazepam (ATIVAN) tablet 0.5 mg, 0.5 mg, Oral, Q6H PRN, Volanda Napoleon, MD .  multivitamin with minerals tablet 1 tablet, 1 tablet, Oral, Daily, Jacon Whetzel, Rudell Cobb, MD, 1 tablet at 11/06/17 1226 .  ondansetron (ZOFRAN) injection 4 mg, 4 mg, Intravenous, Q6H PRN, Volanda Napoleon, MD, 4 mg at 11/06/17 1318 .  ondansetron (ZOFRAN) tablet 8 mg, 8 mg, Oral, BID PRN, Volanda Napoleon, MD .  prochlorperazine (COMPAZINE) tablet 10 mg, 10 mg, Oral, Q6H PRN, Aireonna Bauer,  Rudell Cobb, MD .  thiamine (VITAMIN B-1) tablet 100 mg, 100 mg, Oral, Daily, Moshe Wenger, Rudell Cobb, MD, 100 mg at 11/06/17 1226:  . acyclovir  400 mg Oral BID  . antiseptic oral rinse  15 mL Mouth Rinse Q4H  . CISplatin with cyclophosphamide and etoposide chemo infusion   Intravenous Once  . DOXOrubicin (ADRIAMYCIN) CHEMO for Inpatient continous infusion  7.5 mg/m2 (Treatment Plan Recorded) Intravenous Once  . dronabinol  2.5 mg Oral TID AC  . fluconazole  100 mg Oral Daily  . folic acid  1 mg Oral Daily  . multivitamin with minerals  1 tablet Oral Daily  . thiamine  100 mg Oral Daily  :  No Active Allergies:  Family History  Problem Relation  Age of Onset  . Multiple myeloma Mother   . Cancer Father   :  Social History   Socioeconomic History  . Marital status: Married    Spouse name: Not on file  . Number of children: Not on file  . Years of education: Not on file  . Highest education level: Not on file  Occupational History  . Occupation: retired    Comment: Designer, television/film set  Social Needs  . Financial resource strain: Not on file  . Food insecurity:    Worry: Not on file    Inability: Not on file  . Transportation needs:    Medical: Not on file    Non-medical: Not on file  Tobacco Use  . Smoking status: Never Smoker  . Smokeless tobacco: Never Used  Substance and Sexual Activity  . Alcohol use: No  . Drug use: No  . Sexual activity: Not on file  Lifestyle  . Physical activity:    Days per week: Not on file    Minutes per session: Not on file  . Stress: Not on file  Relationships  . Social connections:    Talks on phone: Not on file    Gets together: Not on file    Attends religious service: Not on file    Active member of club or organization: Not on file    Attends meetings of clubs or organizations: Not on file    Relationship status: Not on file  . Intimate partner violence:    Fear of current or ex partner: Not on file    Emotionally abused: Not on file    Physically abused: Not on file    Forced sexual activity: Not on file  Other Topics Concern  . Not on file  Social History Narrative  . Not on file  :  Review of Systems  Constitutional: Positive for weight loss.  HENT: Negative.   Eyes: Negative.   Respiratory: Negative.   Cardiovascular: Positive for leg swelling.  Gastrointestinal: Negative.   Genitourinary: Negative.   Musculoskeletal: Negative.   Skin: Negative.   Neurological: Negative.   Endo/Heme/Allergies: Bruises/bleeds easily.  Psychiatric/Behavioral: Negative.      Exam: Petite, thin white female in no obvious distress.  Vital signs are temperature of 98.2.   Pulse 79.  Blood pressure 96/58.  Weight is 104 pounds.  Head neck exam shows no ocular or oral lesions.  She has no mucositis.  There is no scleral icterus.  She has no adenopathy in the neck.  Lungs are clear bilaterally.  Cardiac exam regular rate and rhythm with no murmurs, rubs or bruits.  Abdomen is soft.  She has decent bowel sounds.  There is no fluid wave.  There is no  palpable liver or spleen tip.  Extremities shows 1+ edema about her feet and ankles.  Neurological exam is nonfocal. Patient Vitals for the past 24 hrs:  BP Temp Temp src Pulse Resp SpO2 Height Weight  11/07/17 0526 (!) 96/58 98.2 F (36.8 C) Oral 79 16 96 % - 104 lb 4.4 oz (47.3 kg)  11/06/17 2055 (!) 89/59 98.9 F (37.2 C) Oral 92 15 95 % - -  11/06/17 1048 100/63 97.6 F (36.4 C) Oral 85 14 100 % 5' 3"  (1.6 m) 94 lb 9.6 oz (42.9 kg)     Recent Labs    11/06/17 1225 11/07/17 0500  WBC 3.1* 2.5*  HGB 6.8* 5.7*  HCT 19.8* 16.7*  PLT 14* 14*   Recent Labs    11/06/17 1225 11/07/17 0500  NA 141 141  K 3.3* 3.4*  CL 108 110  CO2 23 20*  GLUCOSE 99 162*  BUN 37* 36*  CREATININE 2.70* 2.25*  CALCIUM 7.6* 7.3*    Blood smear review: None  Pathology: None    Assessment and Plan: Ms. Rahrig is a 67 year old white female with refractory kappa light chain myeloma.  Again, she is responded nicely to the first cycle of treatment.  We will proceed with the second cycle of VD-PACE.  I would not make any dosage adjustments.  Her renal function is doing much better.  I just am not sure she could tolerate a higher dose of treatment.  The first cycle that she received obviously was a good dose for her given the relative lack of side effects and the fact that she had a very nice drop in her Kappa light chain level.  We will have to watch her fluid intake and output.  She did get somewhat volume overloaded last time she was here.  We will be very liberal with giving Lasix.  Her hemoglobin is quite low this  morning.  I will have the transfuse her.  I probably would just give her 1 unit of blood for right now.  We will have her on the Famvir and the Diflucan.  She has an element of dementia.  She is very pleasant.  Usually 1 of her family members are always with her to help out.  We will try to encourage her appetite with using Marinol.  Hopefully, we will be able to get her out of the hospital by Friday if all goes well.  I know that she will get fantastic care from all the staff up on [address].  Jeanne Haw, MD  Hebrews 12:12

## 2017-11-07 NOTE — Progress Notes (Signed)
Chemo doses and dilutions verified with Nancy Marus, RN.

## 2017-11-08 ENCOUNTER — Other Ambulatory Visit: Payer: Medicare Other

## 2017-11-08 LAB — CBC WITH DIFFERENTIAL/PLATELET
Basophils Absolute: 0 10*3/uL (ref 0.0–0.1)
Basophils Relative: 0 %
EOS ABS: 0 10*3/uL (ref 0.0–0.7)
Eosinophils Relative: 0 %
HEMATOCRIT: 25.5 % — AB (ref 36.0–46.0)
HEMOGLOBIN: 8.7 g/dL — AB (ref 12.0–15.0)
LYMPHS ABS: 0.2 10*3/uL — AB (ref 0.7–4.0)
LYMPHS PCT: 3 %
MCH: 29.6 pg (ref 26.0–34.0)
MCHC: 34.1 g/dL (ref 30.0–36.0)
MCV: 86.7 fL (ref 78.0–100.0)
MONOS PCT: 6 %
Monocytes Absolute: 0.5 10*3/uL (ref 0.1–1.0)
NEUTROS PCT: 91 %
Neutro Abs: 6.5 10*3/uL (ref 1.7–7.7)
Platelets: 12 10*3/uL — CL (ref 150–400)
RBC: 2.94 MIL/uL — AB (ref 3.87–5.11)
RDW: 14.7 % (ref 11.5–15.5)
WBC: 7.2 10*3/uL (ref 4.0–10.5)

## 2017-11-08 LAB — BPAM RBC
BLOOD PRODUCT EXPIRATION DATE: 201909182359
ISSUE DATE / TIME: 201908131812
UNIT TYPE AND RH: 600

## 2017-11-08 LAB — COMPREHENSIVE METABOLIC PANEL
ALT: 14 U/L (ref 0–44)
ANION GAP: 6 (ref 5–15)
AST: 19 U/L (ref 15–41)
Albumin: 2.7 g/dL — ABNORMAL LOW (ref 3.5–5.0)
Alkaline Phosphatase: 67 U/L (ref 38–126)
BILIRUBIN TOTAL: 0.4 mg/dL (ref 0.3–1.2)
BUN: 30 mg/dL — ABNORMAL HIGH (ref 8–23)
CALCIUM: 6.6 mg/dL — AB (ref 8.9–10.3)
CO2: 20 mmol/L — AB (ref 22–32)
CREATININE: 1.61 mg/dL — AB (ref 0.44–1.00)
Chloride: 116 mmol/L — ABNORMAL HIGH (ref 98–111)
GFR, EST AFRICAN AMERICAN: 37 mL/min — AB (ref >60)
GFR, EST NON AFRICAN AMERICAN: 32 mL/min — AB (ref >60)
Glucose, Bld: 152 mg/dL — ABNORMAL HIGH (ref 70–99)
Potassium: 3.1 mmol/L — ABNORMAL LOW (ref 3.5–5.1)
SODIUM: 142 mmol/L (ref 135–145)
TOTAL PROTEIN: 4.9 g/dL — AB (ref 6.5–8.1)

## 2017-11-08 LAB — TYPE AND SCREEN
ABO/RH(D): A POS
Antibody Screen: NEGATIVE
UNIT DIVISION: 0

## 2017-11-08 MED ORDER — DEXAMETHASONE 4 MG PO TABS
40.0000 mg | ORAL_TABLET | Freq: Once | ORAL | Status: AC
  Administered 2017-11-08: 40 mg via ORAL
  Filled 2017-11-08: qty 10

## 2017-11-08 MED ORDER — SODIUM CHLORIDE 0.9 % IV SOLN
7.5000 mg/m2 | Freq: Once | INTRAVENOUS | Status: AC
  Administered 2017-11-08: 10 mg via INTRAVENOUS
  Filled 2017-11-08: qty 5

## 2017-11-08 MED ORDER — SODIUM CHLORIDE 0.9 % IV SOLN
Freq: Once | INTRAVENOUS | Status: AC
  Administered 2017-11-08: 595 mg via INTRAVENOUS
  Filled 2017-11-08: qty 7

## 2017-11-08 MED ORDER — POTASSIUM CHLORIDE 2 MEQ/ML IV SOLN
Freq: Once | INTRAVENOUS | Status: DC
  Filled 2017-11-08: qty 10

## 2017-11-08 MED ORDER — KCL IN DEXTROSE-NACL 20-5-0.45 MEQ/L-%-% IV SOLN
Freq: Once | INTRAVENOUS | Status: AC
  Administered 2017-11-08: 11:00:00 via INTRAVENOUS
  Filled 2017-11-08: qty 1000

## 2017-11-08 MED ORDER — PALONOSETRON HCL INJECTION 0.25 MG/5ML
0.2500 mg | Freq: Once | INTRAVENOUS | Status: AC
  Administered 2017-11-08: 0.25 mg via INTRAVENOUS
  Filled 2017-11-08: qty 5

## 2017-11-08 NOTE — Progress Notes (Signed)
So far, Jeanne Martinez is doing okay.  She did get a unit of blood last night.  She urinated quite a bit because of the Lasix we gave her afterwards.  I want to make sure that we are very aggressive and monitoring her intake and output.  So far, she has had 2.2 L in since admission.  We will have to watch this closely.  I may end up having to give her some Lasix tomorrow.  She is not eating as well.  She says that she has no taste.  I told her that she can rinse her mouth out with mouth rinse 6 times a day.  She has another IV in for fluids that has magnesium.  This really is an aggravation for her.  She has very poor peripheral IV access.  I will see if pharmacy can take the magnesium out of the fluid.  I really do not think it is all that important given the low dose of cis-platinum that she is getting.  Her creatinine is down to 1.6.  This is amazing.  Her potassium is 3.1.  Her blood sugar is 152.  Her white cell count is 7.2.  Hemoglobin 8.7.  Platelet count 12,000.  I will hold on transfusing her right now.  She is had no fever.  There is been no nausea or vomiting.  She still has the short-term memory issues.  Her vital signs look good.  Her temperature is 97.9.  Pulse 72.  Blood pressure 106/69.  Oxygen saturation 99%.  Oral exam shows no mucositis or thrush.  Lungs sound clear bilaterally.  She has good air movement bilaterally.  Cardiac exam regular rate and rhythm.  She has no murmurs.  Abdomen is soft.  She has good bowel sounds.  There is no fluid wave.  There is no palpable liver or spleen tip.  Back exam shows no tenderness over the spine, ribs or hips.  Extremities does show the 1+ edema in her lower legs.  This actually is better.  Neurological exam is nonfocal.  [name] is doing well with the VD-PACE.  This is day #3.  Hopefully, we will be able to get her home on Friday.  This would really be nice if we could get her home.  I am so thankful for the wonderful care that she  is getting from the staff up on [address].  I know that their compassion for [name] and her family is well appreciated by everybody.  Lattie Haw, MD  1 Thessalonians 5:16-18

## 2017-11-08 NOTE — Progress Notes (Signed)
BSA, chemo dosages and dilutions verified by this RN and Drue Dun, RN

## 2017-11-09 LAB — COMPREHENSIVE METABOLIC PANEL
ALBUMIN: 2.5 g/dL — AB (ref 3.5–5.0)
ALK PHOS: 60 U/L (ref 38–126)
ALT: 14 U/L (ref 0–44)
ANION GAP: 7 (ref 5–15)
AST: 19 U/L (ref 15–41)
BILIRUBIN TOTAL: 0.3 mg/dL (ref 0.3–1.2)
BUN: 33 mg/dL — AB (ref 8–23)
CO2: 20 mmol/L — AB (ref 22–32)
Calcium: 6.7 mg/dL — ABNORMAL LOW (ref 8.9–10.3)
Chloride: 114 mmol/L — ABNORMAL HIGH (ref 98–111)
Creatinine, Ser: 1.4 mg/dL — ABNORMAL HIGH (ref 0.44–1.00)
GFR calc Af Amer: 44 mL/min — ABNORMAL LOW (ref >60)
GFR calc non Af Amer: 38 mL/min — ABNORMAL LOW (ref >60)
GLUCOSE: 130 mg/dL — AB (ref 70–99)
POTASSIUM: 3.2 mmol/L — AB (ref 3.5–5.1)
SODIUM: 141 mmol/L (ref 135–145)
TOTAL PROTEIN: 4.7 g/dL — AB (ref 6.5–8.1)

## 2017-11-09 LAB — CBC WITH DIFFERENTIAL/PLATELET
BASOS ABS: 0 10*3/uL (ref 0.0–0.1)
BASOS PCT: 0 %
EOS ABS: 0 10*3/uL (ref 0.0–0.7)
Eosinophils Relative: 0 %
HEMATOCRIT: 26.1 % — AB (ref 36.0–46.0)
HEMOGLOBIN: 9.1 g/dL — AB (ref 12.0–15.0)
Lymphocytes Relative: 2 %
Lymphs Abs: 0.1 10*3/uL — ABNORMAL LOW (ref 0.7–4.0)
MCH: 30.1 pg (ref 26.0–34.0)
MCHC: 34.9 g/dL (ref 30.0–36.0)
MCV: 86.4 fL (ref 78.0–100.0)
Monocytes Absolute: 0.4 10*3/uL (ref 0.1–1.0)
Monocytes Relative: 6 %
NEUTROS ABS: 5.8 10*3/uL (ref 1.7–7.7)
NEUTROS PCT: 92 %
Platelets: 13 10*3/uL — CL (ref 150–400)
RBC: 3.02 MIL/uL — ABNORMAL LOW (ref 3.87–5.11)
RDW: 15.3 % (ref 11.5–15.5)
WBC: 6.4 10*3/uL (ref 4.0–10.5)

## 2017-11-09 LAB — MAGNESIUM: Magnesium: 1.3 mg/dL — ABNORMAL LOW (ref 1.7–2.4)

## 2017-11-09 MED ORDER — SODIUM CHLORIDE 0.9 % IV SOLN
Freq: Once | INTRAVENOUS | Status: AC
  Administered 2017-11-09: 7 mg via INTRAVENOUS
  Filled 2017-11-09: qty 7

## 2017-11-09 MED ORDER — DEXAMETHASONE 4 MG PO TABS
40.0000 mg | ORAL_TABLET | Freq: Once | ORAL | Status: AC
  Administered 2017-11-09: 40 mg via ORAL
  Filled 2017-11-09: qty 10

## 2017-11-09 MED ORDER — SODIUM CHLORIDE 0.9 % IV SOLN
7.5000 mg/m2 | Freq: Once | INTRAVENOUS | Status: AC
  Administered 2017-11-09: 10 mg via INTRAVENOUS
  Filled 2017-11-09: qty 5

## 2017-11-09 MED ORDER — BORTEZOMIB CHEMO IV INJECTION 3.5 MG(1MG/ML)
1.3000 mg/m2 | Freq: Once | INTRAMUSCULAR | Status: AC
  Administered 2017-11-09: 1.8 mg via INTRAVENOUS
  Filled 2017-11-09: qty 1.8

## 2017-11-09 NOTE — Care Management Important Message (Signed)
Important Message  Patient Details  Name: Jeanne Martinez MRN: [id number] Date of Birth: 09-Jan-1951   Medicare Important Message Given:  Yes    Kerin Salen 11/09/2017, 10:08 AMImportant Message  Patient Details  Name: Jeanne Martinez MRN: [id number] Date of Birth: 1951-03-14   Medicare Important Message Given:  Yes    Kerin Salen 11/09/2017, 10:08 AM

## 2017-11-09 NOTE — Progress Notes (Signed)
So far, everything is going well for Jeanne Martinez and her chemotherapy.  She had a very good urine output yesterday.  Her intake and output are relatively even right now.  Her creatinine yesterday was 1.61.  This is a lowest that it has been in quite a while.  The labs yet today are not back.  She still not eating all that great.  I think some of the eating issues is probably from her short-term memory issues.  She has had no nausea or vomiting.  She has had no mucositis.  She has had no cough or shortness of breath.  The leg edema appears to be better.  She is had no bleeding.  There is been no obvious fever.  On her physical exam, her vital signs show a temperature of 97.8.  Pulse 64.  Blood pressure 120/78.  Oxygen saturation on room air is 97%.  Oral exam shows no mucositis.  There is no petechia.  Lungs are clear.  Cardiac exam regular rate and rhythm.  Abdomen is soft.  Bowel sounds are present.  There is no guarding or rebound tenderness.  Extremities show some slight edema in her lower legs.  Neurological exam is nonfocal I saw the memory issues which is chronic.  Skin shows no petechia.  She has some scattered ecchymoses.  Jeanne Martinez is receiving her second cycle of VD-PACE for the refractory light chain myeloma.  She has responded so well to the first cycle of chemotherapy.  Hopefully, will see a similar response with this second and then consider her for CAR-T therapy.  We will try to finish up tomorrow.  Hopefully if we can, we will discharge her tomorrow.  We will have to see what her labs look like.  Lattie Haw, MD  Psalm 100:5

## 2017-11-10 ENCOUNTER — Other Ambulatory Visit: Payer: Self-pay | Admitting: Hematology & Oncology

## 2017-11-10 ENCOUNTER — Other Ambulatory Visit: Payer: Medicare Other

## 2017-11-10 DIAGNOSIS — C9000 Multiple myeloma not having achieved remission: Secondary | ICD-10-CM

## 2017-11-10 LAB — CBC WITH DIFFERENTIAL/PLATELET
Basophils Absolute: 0 10*3/uL (ref 0.0–0.1)
Basophils Relative: 0 %
EOS PCT: 0 %
Eosinophils Absolute: 0 10*3/uL (ref 0.0–0.7)
HCT: 23.7 % — ABNORMAL LOW (ref 36.0–46.0)
HEMOGLOBIN: 8.1 g/dL — AB (ref 12.0–15.0)
LYMPHS ABS: 0.1 10*3/uL — AB (ref 0.7–4.0)
LYMPHS PCT: 1 %
MCH: 29.5 pg (ref 26.0–34.0)
MCHC: 34.2 g/dL (ref 30.0–36.0)
MCV: 86.2 fL (ref 78.0–100.0)
MONOS PCT: 3 %
Monocytes Absolute: 0.1 10*3/uL (ref 0.1–1.0)
NEUTROS PCT: 96 %
Neutro Abs: 3.6 10*3/uL (ref 1.7–7.7)
Platelets: 14 10*3/uL — CL (ref 150–400)
RBC: 2.75 MIL/uL — AB (ref 3.87–5.11)
RDW: 15.5 % (ref 11.5–15.5)
WBC: 3.7 10*3/uL — AB (ref 4.0–10.5)

## 2017-11-10 LAB — COMPREHENSIVE METABOLIC PANEL
ALK PHOS: 55 U/L (ref 38–126)
ALT: 14 U/L (ref 0–44)
AST: 20 U/L (ref 15–41)
Albumin: 2.3 g/dL — ABNORMAL LOW (ref 3.5–5.0)
Anion gap: 8 (ref 5–15)
BUN: 37 mg/dL — ABNORMAL HIGH (ref 8–23)
CALCIUM: 6.6 mg/dL — AB (ref 8.9–10.3)
CO2: 21 mmol/L — ABNORMAL LOW (ref 22–32)
CREATININE: 1.23 mg/dL — AB (ref 0.44–1.00)
Chloride: 111 mmol/L (ref 98–111)
GFR, EST AFRICAN AMERICAN: 51 mL/min — AB (ref >60)
GFR, EST NON AFRICAN AMERICAN: 44 mL/min — AB (ref >60)
Glucose, Bld: 113 mg/dL — ABNORMAL HIGH (ref 70–99)
Potassium: 2.9 mmol/L — ABNORMAL LOW (ref 3.5–5.1)
Sodium: 140 mmol/L (ref 135–145)
TOTAL PROTEIN: 4.3 g/dL — AB (ref 6.5–8.1)
Total Bilirubin: 0.3 mg/dL (ref 0.3–1.2)

## 2017-11-10 LAB — MAGNESIUM: MAGNESIUM: 1.4 mg/dL — AB (ref 1.7–2.4)

## 2017-11-10 MED ORDER — POTASSIUM CHLORIDE 10 MEQ/50ML IV SOLN
10.0000 meq | INTRAVENOUS | Status: AC
  Administered 2017-11-10 (×4): 10 meq via INTRAVENOUS
  Filled 2017-11-10 (×4): qty 50

## 2017-11-10 MED ORDER — HEPARIN SOD (PORK) LOCK FLUSH 100 UNIT/ML IV SOLN
500.0000 [IU] | INTRAVENOUS | Status: AC | PRN
  Administered 2017-11-10: 500 [IU]
  Filled 2017-11-10: qty 5

## 2017-11-10 MED ORDER — LEVOFLOXACIN 500 MG PO TABS
250.0000 mg | ORAL_TABLET | Freq: Every day | ORAL | Status: DC
  Administered 2017-11-10: 250 mg via ORAL
  Filled 2017-11-10: qty 1

## 2017-11-10 NOTE — Discharge Summary (Signed)
This is a discharge summary for [employer].  She was admitted on August 12.  She is being discharged on November 10, 2017.  Her diagnosis upon discharge is: #1 refractory light chain myeloma. 2.  Cycle #2 of chemotherapy with VD-PACE 3.  Pancytopenia secondary to chemotherapy 4.  Short-term memory loss 5.  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  We have told her that she has to keep trying to eat better.  Follow-up is with [facility] 11/13/2017  HOSPITAL COURSE:  Ms. Dunn was admitted to [facility] [address] at [facility].  She came in for her second cycle of VD-PACE.  Her Port-A-Cath was accessed.  She did have a second IV line placed for IV fluids.  She was doing much better.  Her renal function was improving.  Her light chains to come down from 21,000 down to 600.  She was started on her chemotherapy on Monday.  Pharmacy was very kind and helping to dose the VD-PACE.  She was quite anemic and thrombocytopenic.  This pancytopenia was from her myeloma and to some degree from her chemotherapy.  She was given some red blood cells.  She is been given some platelets.  We did a chest x-ray on admission.  This did not show any fluid overload.  There is no pneumonia.  Her appetite has just not been great.  We have her on Marinol.  She has a short-term memory issue and just forgets about eating.  She tolerated her chemotherapy incredibly well.  She is urinating nicely.  Her intake and output were relatively even throughout the entire hospital stay.  She was out of bed.  She was sitting in her chair.  Para she had no nausea or vomiting.  She had no diarrhea.  She had no bleeding.  Upon the day of discharge, her sodium was 140 potassium 2.9.  Her BUN was 37 creatinine 1.23.  Magnesium 1.4.  Her white cell count was 3.7.  Hemoglobin  8.1, her platelet count 14,000.  She will get some IV potassium prior to discharge.  On her physical exam upon discharge, her vital signs show temperature of 97.7.  Pulse 61.  Blood pressure 118/80.  Her head neck exam shows no mucositis.  She has alopecia.  There is no adenopathy in the neck.  Lungs are clear.  Cardiac exam regular rate and rhythm with no murmurs, rubs or bruits.  Abdomen is soft.  She has good bowel sounds.  There is no fluid wave.  Extremities shows no clubbing or cyanosis.  She has some chronic 1+ edema in her lower legs.  Neurological exam shows the short-term memory issues but this is chronic.  This is a discharge summary for [employer].  Lattie Haw, MD

## 2017-11-13 ENCOUNTER — Inpatient Hospital Stay: Payer: Medicare Other

## 2017-11-13 ENCOUNTER — Other Ambulatory Visit: Payer: Self-pay

## 2017-11-13 ENCOUNTER — Other Ambulatory Visit: Payer: Self-pay | Admitting: *Deleted

## 2017-11-13 ENCOUNTER — Other Ambulatory Visit: Payer: Self-pay | Admitting: Family

## 2017-11-13 ENCOUNTER — Inpatient Hospital Stay (HOSPITAL_BASED_OUTPATIENT_CLINIC_OR_DEPARTMENT_OTHER): Payer: Medicare Other | Admitting: Hematology & Oncology

## 2017-11-13 ENCOUNTER — Telehealth: Payer: Self-pay | Admitting: *Deleted

## 2017-11-13 ENCOUNTER — Encounter: Payer: Self-pay | Admitting: Hematology & Oncology

## 2017-11-13 VITALS — BP 108/61 | HR 93 | Temp 97.7°F | Resp 18 | Wt 98.4 lb

## 2017-11-13 VITALS — BP 108/61 | HR 93 | Temp 97.7°F

## 2017-11-13 DIAGNOSIS — N179 Acute kidney failure, unspecified: Secondary | ICD-10-CM

## 2017-11-13 DIAGNOSIS — D696 Thrombocytopenia, unspecified: Secondary | ICD-10-CM

## 2017-11-13 DIAGNOSIS — D649 Anemia, unspecified: Secondary | ICD-10-CM | POA: Diagnosis not present

## 2017-11-13 DIAGNOSIS — R609 Edema, unspecified: Secondary | ICD-10-CM | POA: Diagnosis not present

## 2017-11-13 DIAGNOSIS — C9000 Multiple myeloma not having achieved remission: Secondary | ICD-10-CM

## 2017-11-13 DIAGNOSIS — R634 Abnormal weight loss: Secondary | ICD-10-CM | POA: Diagnosis not present

## 2017-11-13 LAB — CMP (CANCER CENTER ONLY)
ALK PHOS: 63 U/L (ref 26–84)
ALT: 20 U/L (ref 10–47)
ANION GAP: 2 — AB (ref 5–15)
AST: 23 U/L (ref 11–38)
Albumin: 2.9 g/dL — ABNORMAL LOW (ref 3.5–5.0)
BILIRUBIN TOTAL: 0.5 mg/dL (ref 0.2–1.6)
BUN: 35 mg/dL — ABNORMAL HIGH (ref 7–22)
CHLORIDE: 107 mmol/L (ref 98–108)
CO2: 26 mmol/L (ref 18–33)
Calcium: 8.3 mg/dL (ref 8.0–10.3)
Creatinine: 1.4 mg/dL — ABNORMAL HIGH (ref 0.60–1.20)
Glucose, Bld: 125 mg/dL — ABNORMAL HIGH (ref 73–118)
POTASSIUM: 3.2 mmol/L — AB (ref 3.3–4.7)
SODIUM: 135 mmol/L (ref 128–145)
Total Protein: 5 g/dL — ABNORMAL LOW (ref 6.4–8.1)

## 2017-11-13 LAB — CBC WITH DIFFERENTIAL (CANCER CENTER ONLY)
Basophils Absolute: 0 10*3/uL (ref 0.0–0.1)
Basophils Relative: 2 %
EOS ABS: 0 10*3/uL (ref 0.0–0.5)
Eosinophils Relative: 2 %
HEMATOCRIT: 25.1 % — AB (ref 34.8–46.6)
HEMOGLOBIN: 8.5 g/dL — AB (ref 11.6–15.9)
LYMPHS ABS: 0.1 10*3/uL — AB (ref 0.9–3.3)
Lymphocytes Relative: 13 %
MCH: 29.6 pg (ref 26.0–34.0)
MCHC: 33.9 g/dL (ref 32.0–36.0)
MCV: 87.5 fL (ref 81.0–101.0)
MONOS PCT: 0 %
Monocytes Absolute: 0 10*3/uL — ABNORMAL LOW (ref 0.1–0.9)
NEUTROS PCT: 83 %
Neutro Abs: 0.4 10*3/uL — CL (ref 1.5–6.5)
Platelet Count: 6 10*3/uL — CL (ref 145–400)
RBC: 2.87 MIL/uL — ABNORMAL LOW (ref 3.70–5.32)
RDW: 14.7 % (ref 11.1–15.7)
WBC Count: 0.5 10*3/uL — CL (ref 3.9–10.0)

## 2017-11-13 LAB — SAMPLE TO BLOOD BANK

## 2017-11-13 LAB — LACTATE DEHYDROGENASE: LDH: 168 U/L (ref 98–192)

## 2017-11-13 MED ORDER — SODIUM CHLORIDE 0.9% FLUSH
10.0000 mL | INTRAVENOUS | Status: DC | PRN
  Administered 2017-11-13: 10 mL via INTRAVENOUS
  Filled 2017-11-13: qty 10

## 2017-11-13 MED ORDER — PEGFILGRASTIM INJECTION 6 MG/0.6ML ~~LOC~~
PREFILLED_SYRINGE | SUBCUTANEOUS | Status: AC
  Filled 2017-11-13: qty 0.6

## 2017-11-13 MED ORDER — HEPARIN SOD (PORK) LOCK FLUSH 100 UNIT/ML IV SOLN
500.0000 [IU] | Freq: Once | INTRAVENOUS | Status: AC
  Administered 2017-11-13: 500 [IU] via INTRAVENOUS
  Filled 2017-11-13: qty 5

## 2017-11-13 MED ORDER — PEGFILGRASTIM INJECTION 6 MG/0.6ML ~~LOC~~
6.0000 mg | PREFILLED_SYRINGE | Freq: Once | SUBCUTANEOUS | Status: AC
  Administered 2017-11-13: 6 mg via SUBCUTANEOUS

## 2017-11-13 NOTE — Progress Notes (Signed)
Hematology and Oncology Follow Up Visit  Jeanne Martinez [id number] December 16, 1950 67 y.o. 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  So far, she really has done well with the VD-PACE.  She completed her second cycle of VD-PACE last Friday.  She did incredibly well with this.  We had no problems with treatment.  She will get her Neulasta today.  It will be interesting to see what her light chain level is.  Her renal function continues to improve.  When she was discharged, her creatinine was 1.23.  It is her appetite and weight that are the problem.  She has her moments of eating better.  We now have her on Marinol to try to help improve her appetite.  She is had no nausea or vomiting.  She has had no rashes.  Is been no bleeding.  Her platelet count is only 6000.  We will have to give her some platelets.  What I think is incredibly important is that we she has not been transfused with blood now for over a week.  This I think is incredibly indicative of the myeloma being treated.  We will see what her Kappa light chain is.    I still would like to get her to CAR-T therapy.  We will plan to transfer her down to [facility] so that the myeloma specialist at the [facility] will be able to help [place].  Medications:  Allergies as of 11/13/2017   No Known Allergies     Medication List        Accurate as of 11/13/17  1:59 PM. Always  use your most recent med list.          antiseptic oral rinse Liqd 15 mLs by Mouth Rinse route every 4 (four) hours.   dronabinol 2.5 MG capsule Commonly known as:  MARINOL Take 1 capsule (2.5 mg total) by mouth 3 (three) times daily before meals.   famciclovir 250 MG tablet Commonly known as:  FAMVIR TAKE 1 TABLET BY MOUTH EVERY DAY   fluconazole 100 MG tablet Commonly known as:  DIFLUCAN Take 1 tablet (100 mg total) by mouth daily.   levofloxacin 250 MG tablet Commonly known as:  LEVAQUIN Take 1 tablet (250 mg total) by mouth daily.   lidocaine-prilocaine cream Commonly known as:  EMLA Apply 1 application topically as needed. Apply to port one hour before appointment   LORazepam 0.5 MG tablet Commonly known as:  ATIVAN Take 1 tablet (0.5 mg total) by mouth every 6 (six) hours as needed (Nausea or vomiting).   ondansetron 8 MG tablet Commonly known as:  ZOFRAN Take 1 tablet (8 mg total) by mouth 2 (two) times daily as needed for refractory nausea / vomiting. Start on the second day after inpatient chemo completed.   prochlorperazine 10 MG tablet Commonly known as:  COMPAZINE Take 1 tablet (10 mg total) by mouth every 6 (six) hours as needed (Nausea or vomiting).  Allergies:  No Known Allergies  Past Medical History, Surgical history, Social history, and Family History were reviewed and updated.  Review of Systems: Review of Systems  Constitutional: Positive for weight loss.  HENT: Negative.   Eyes: Negative.   Respiratory: Negative.   Cardiovascular: Negative.   Gastrointestinal: Negative.   Genitourinary: Negative.   Musculoskeletal: Negative.   Skin: Negative.   Neurological: Negative.   Endo/Heme/Allergies: Negative.   Psychiatric/Behavioral: Negative.    Marland Kitchen   Physical Exam:  weight is 98 lb 6 oz (44.6 kg). Her oral temperature is 97.7 F (36.5 C). Her blood pressure is 108/61 and her pulse is 93. Her respiration is 18.   Wt Readings from  Last 3 Encounters:  11/13/17 98 lb 6 oz (44.6 kg)  11/10/17 105 lb 6.1 oz (47.8 kg)  11/03/17 91 lb 12 oz (41.6 kg)    Physical Exam  Constitutional: She is oriented to person, place, and time.  HENT:  Head: Normocephalic and atraumatic.  Mouth/Throat: Oropharynx is clear and moist.  Eyes: Pupils are equal, round, and reactive to light. EOM are normal.  Neck: Normal range of motion.  Cardiovascular: Normal rate, regular rhythm and normal heart sounds.  Pulmonary/Chest: Effort normal and breath sounds normal.  Abdominal: Soft. Bowel sounds are normal.  Musculoskeletal: Normal range of motion. She exhibits no edema, tenderness or deformity.  Lymphadenopathy:    She has no cervical adenopathy.  Neurological: She is alert and oriented to person, place, and time.  Skin: Skin is warm and dry. No rash noted. No erythema.  Psychiatric: She has a normal mood and affect. Her behavior is normal. Judgment and thought content normal.  Vitals reviewed.    Lab Results  Component Value Date   WBC 0.5 (LL) 11/13/2017   HGB 8.5 (L) 11/13/2017   HCT 25.1 (L) 11/13/2017   MCV 87.5 11/13/2017   PLT 6 (LL) 11/13/2017   Lab Results  Component Value Date   FERRITIN 1,608 (H) 08/23/2017   IRON 98 08/23/2017   TIBC 210 (L) 08/23/2017   UIBC 112 08/23/2017   IRONPCTSAT 47 08/23/2017   Lab Results  Component Value Date   RBC 2.87 (L) 11/13/2017   Lab Results  Component Value Date   KPAFRELGTCHN 658.9 (H) 10/30/2017   LAMBDASER <1.5 (L) 10/30/2017   KAPLAMBRATIO See below. 10/30/2017   Lab Results  Component Value Date   IGGSERUM 178 (L) 10/30/2017   IGGSERUM 186 (L) 10/30/2017   IGA 13 (L) 10/30/2017   IGA 13 (L) 10/30/2017   IGMSERUM 8 (L) 10/30/2017   IGMSERUM 6 (L) 10/30/2017   Lab Results  Component Value Date   TOTALPROTELP 4.9 (L) 10/30/2017   ALBUMINELP 2.8 (L) 10/30/2017   A1GS 0.3 10/30/2017   A2GS 0.9 10/30/2017   BETS 0.7 10/30/2017   GAMS 0.1 (L) 10/30/2017    MSPIKE 0.1 (H) 10/30/2017   SPEI Comment 10/06/2017     Chemistry      Component Value Date/Time   NA 135 11/13/2017 1030   NA 148 (H) 03/17/2017 1210   K 3.2 (L) 11/13/2017 1030   K 4.4 03/17/2017 1210   CL 107 11/13/2017 1030   CL 106 03/17/2017 1210   CO2 26 11/13/2017 1030   CO2 27 03/17/2017 1210   BUN 35 (H) 11/13/2017 1030   BUN 23 (H) 03/17/2017 1210   CREATININE 1.40 (H) 11/13/2017 1030   CREATININE 1.1 03/17/2017 1210      Component Value Date/Time   CALCIUM  8.3 11/13/2017 1030   CALCIUM 9.1 03/17/2017 1210   ALKPHOS 63 11/13/2017 1030   ALKPHOS 52 03/17/2017 1210   AST 23 11/13/2017 1030   ALT 20 11/13/2017 1030   ALT 21 03/17/2017 1210   BILITOT 0.5 11/13/2017 1030      Impression and Plan: Jeanne Martinez is a very pleasant 67 yo caucasian female with IgA kappa myeloma with 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   Again, I had believe that her light chain level is good to be much better.  We will have to continue to check her labs every other day for right now.  I will plan to see her back in another week.  Once I feel she is in shape for CAR-T therapy, I will then call down to [facility] to see how we can get her in quickly.     Volanda Napoleon, MD 8/19/20191:59 PM

## 2017-11-13 NOTE — Telephone Encounter (Signed)
Dr. Marin Olp notified of panic WBC-0.5, ANC-0.4 and PLT count of 6.  Order received for pt to receive platelets today per Dr. Marin Olp.

## 2017-11-14 LAB — PREPARE PLATELET PHERESIS: UNIT DIVISION: 0

## 2017-11-14 LAB — IGG, IGA, IGM
IGA: 16 mg/dL — AB (ref 87–352)
IGM (IMMUNOGLOBULIN M), SRM: 7 mg/dL — AB (ref 26–217)
IgG (Immunoglobin G), Serum: 177 mg/dL — ABNORMAL LOW (ref 700–1600)

## 2017-11-14 LAB — BPAM PLATELET PHERESIS
Blood Product Expiration Date: 201908202359
ISSUE DATE / TIME: 201908191234
Unit Type and Rh: 6200

## 2017-11-14 LAB — KAPPA/LAMBDA LIGHT CHAINS
Kappa free light chain: 400 mg/L — ABNORMAL HIGH (ref 3.3–19.4)
Lambda free light chains: <1.5 mg/L — ABNORMAL LOW (ref 5.7–26.3)

## 2017-11-15 ENCOUNTER — Inpatient Hospital Stay: Payer: Medicare Other

## 2017-11-15 ENCOUNTER — Telehealth: Payer: Self-pay | Admitting: *Deleted

## 2017-11-15 VITALS — BP 114/62 | HR 74 | Temp 97.6°F | Resp 18

## 2017-11-15 DIAGNOSIS — D696 Thrombocytopenia, unspecified: Secondary | ICD-10-CM

## 2017-11-15 DIAGNOSIS — C9000 Multiple myeloma not having achieved remission: Secondary | ICD-10-CM | POA: Diagnosis not present

## 2017-11-15 LAB — CMP (CANCER CENTER ONLY)
ALBUMIN: 3 g/dL — AB (ref 3.5–5.0)
ALT: 20 U/L (ref 10–47)
AST: 27 U/L (ref 11–38)
Alkaline Phosphatase: 61 U/L (ref 26–84)
Anion gap: 10 (ref 5–15)
BILIRUBIN TOTAL: 0.5 mg/dL (ref 0.2–1.6)
BUN: 31 mg/dL — ABNORMAL HIGH (ref 7–22)
CALCIUM: 8.4 mg/dL (ref 8.0–10.3)
CO2: 24 mmol/L (ref 18–33)
CREATININE: 1.4 mg/dL — AB (ref 0.60–1.20)
Chloride: 103 mmol/L (ref 98–108)
Glucose, Bld: 124 mg/dL — ABNORMAL HIGH (ref 73–118)
Potassium: 3.2 mmol/L — ABNORMAL LOW (ref 3.3–4.7)
Sodium: 137 mmol/L (ref 128–145)
Total Protein: 5.1 g/dL — ABNORMAL LOW (ref 6.4–8.1)

## 2017-11-15 LAB — SAMPLE TO BLOOD BANK

## 2017-11-15 MED ORDER — HEPARIN SOD (PORK) LOCK FLUSH 100 UNIT/ML IV SOLN
500.0000 [IU] | Freq: Once | INTRAVENOUS | Status: AC
  Administered 2017-11-15: 500 [IU] via INTRAVENOUS
  Filled 2017-11-15: qty 5

## 2017-11-15 MED ORDER — SODIUM CHLORIDE 0.9% FLUSH
10.0000 mL | INTRAVENOUS | Status: DC | PRN
  Administered 2017-11-15: 10 mL via INTRAVENOUS
  Filled 2017-11-15: qty 10

## 2017-11-15 NOTE — Telephone Encounter (Signed)
Critical Value WBC 0.0 Plt 9 ANC 0 Dr Marin Olp notified. No orders at this time.

## 2017-11-17 ENCOUNTER — Inpatient Hospital Stay: Payer: Medicare Other

## 2017-11-17 ENCOUNTER — Other Ambulatory Visit: Payer: Self-pay

## 2017-11-17 ENCOUNTER — Telehealth: Payer: Self-pay | Admitting: *Deleted

## 2017-11-17 ENCOUNTER — Other Ambulatory Visit: Payer: Self-pay | Admitting: Family

## 2017-11-17 ENCOUNTER — Other Ambulatory Visit: Payer: Self-pay | Admitting: *Deleted

## 2017-11-17 VITALS — BP 101/68 | HR 99 | Temp 98.1°F | Resp 20

## 2017-11-17 DIAGNOSIS — D696 Thrombocytopenia, unspecified: Secondary | ICD-10-CM

## 2017-11-17 DIAGNOSIS — C9000 Multiple myeloma not having achieved remission: Secondary | ICD-10-CM | POA: Diagnosis not present

## 2017-11-17 DIAGNOSIS — Z95828 Presence of other vascular implants and grafts: Secondary | ICD-10-CM

## 2017-11-17 LAB — CBC WITH DIFFERENTIAL (CANCER CENTER ONLY)
Band Neutrophils: 0 %
Basophils Absolute: 0 10*3/uL (ref 0.0–0.1)
Basophils Relative: 0 %
Blasts: 0 %
EOS PCT: 0 %
Eosinophils Absolute: 0 10*3/uL (ref 0.0–0.5)
HEMATOCRIT: 21.6 % — AB (ref 34.8–46.6)
Hemoglobin: 7.1 g/dL — ABNORMAL LOW (ref 11.6–15.9)
LYMPHS PCT: 0 %
Lymphs Abs: 0 10*3/uL — ABNORMAL LOW (ref 0.9–3.3)
MCH: 29.3 pg (ref 26.0–34.0)
MCHC: 32.9 g/dL (ref 32.0–36.0)
MCV: 89.3 fL (ref 81.0–101.0)
MYELOCYTES: 0 %
Metamyelocytes Relative: 0 %
Monocytes Absolute: 0 10*3/uL — ABNORMAL LOW (ref 0.1–0.9)
Monocytes Relative: 0 %
NEUTROS PCT: 0 %
NRBC: 0 /100{WBCs}
OTHER: 0 %
PLATELETS: 5 10*3/uL — AB (ref 145–400)
Promyelocytes Relative: 0 %
RBC: 2.42 MIL/uL — AB (ref 3.70–5.32)
RDW: 14.4 % (ref 11.1–15.7)
WBC: 0 10*3/uL — AB (ref 3.9–10.0)

## 2017-11-17 LAB — CMP (CANCER CENTER ONLY)
ALT: 16 U/L (ref 10–47)
ANION GAP: 8 (ref 5–15)
AST: 26 U/L (ref 11–38)
Albumin: 2.9 g/dL — ABNORMAL LOW (ref 3.5–5.0)
Alkaline Phosphatase: 72 U/L (ref 26–84)
BILIRUBIN TOTAL: 0.5 mg/dL (ref 0.2–1.6)
BUN: 29 mg/dL — ABNORMAL HIGH (ref 7–22)
CHLORIDE: 105 mmol/L (ref 98–108)
CO2: 26 mmol/L (ref 18–33)
Calcium: 8.4 mg/dL (ref 8.0–10.3)
Creatinine: 1.7 mg/dL — ABNORMAL HIGH (ref 0.60–1.20)
GLUCOSE: 104 mg/dL (ref 73–118)
Potassium: 3.2 mmol/L — ABNORMAL LOW (ref 3.3–4.7)
Sodium: 139 mmol/L (ref 128–145)
Total Protein: 5.3 g/dL — ABNORMAL LOW (ref 6.4–8.1)

## 2017-11-17 LAB — SAMPLE TO BLOOD BANK

## 2017-11-17 MED ORDER — HEPARIN SOD (PORK) LOCK FLUSH 100 UNIT/ML IV SOLN
250.0000 [IU] | INTRAVENOUS | Status: DC | PRN
  Filled 2017-11-17: qty 5

## 2017-11-17 MED ORDER — SODIUM CHLORIDE 0.9% FLUSH
10.0000 mL | INTRAVENOUS | Status: AC | PRN
  Administered 2017-11-17: 10 mL
  Filled 2017-11-17: qty 10

## 2017-11-17 MED ORDER — SODIUM CHLORIDE 0.9% IV SOLUTION
250.0000 mL | Freq: Once | INTRAVENOUS | Status: AC
  Administered 2017-11-17: 250 mL via INTRAVENOUS
  Filled 2017-11-17: qty 250

## 2017-11-17 MED ORDER — SODIUM CHLORIDE 0.9% FLUSH
10.0000 mL | INTRAVENOUS | Status: DC | PRN
  Administered 2017-11-17: 10 mL via INTRAVENOUS
  Filled 2017-11-17: qty 10

## 2017-11-17 MED ORDER — HEPARIN SOD (PORK) LOCK FLUSH 100 UNIT/ML IV SOLN
500.0000 [IU] | Freq: Once | INTRAVENOUS | Status: AC
  Administered 2017-11-17: 500 [IU] via INTRAVENOUS
  Filled 2017-11-17: qty 5

## 2017-11-17 MED ORDER — HEPARIN SOD (PORK) LOCK FLUSH 100 UNIT/ML IV SOLN
500.0000 [IU] | Freq: Every day | INTRAVENOUS | Status: AC | PRN
  Administered 2017-11-17: 500 [IU]
  Filled 2017-11-17: qty 5

## 2017-11-17 NOTE — Patient Instructions (Signed)
Platelet Transfusion, Care After °Refer to this sheet in the next few weeks. These instructions provide you with information about caring for yourself after your procedure. Your health care provider may also give you more specific instructions. Your treatment has been planned according to current medical practices, but problems sometimes occur. Call your health care provider if you have any problems or questions after your procedure. °What can I expect after the procedure? °After the procedure, it is common to have: °· Bruising and soreness at the IV site. °· Fever or chills within the first 48 hours of your transfusion. ° °Follow these instructions at home: °· Take medicines only as directed by your health care provider. Ask your health care provider if you can take an over-the-counter pain reliever in case you have a fever or headache a day or two after your transfusion. °· Return to your normal activities as directed by your health care provider. °Contact a health care provider if: °· You have a fever. °· You have a headache. °· You have redness, swelling, or pain at your IV site. °· You have skin itching or a rash. °· You vomit. °· You feel unusually tired or weak. °Get help right away if: °· You have trouble breathing. °· You have a decreased amount of urine or you urinate less often than you normally do. °· Your urine is darker than normal. °· You have pain in your back, abdomen, or chest. °· You have cool, clammy skin. °· You have a rapid heartbeat. °This information is not intended to replace advice given to you by your health care provider. Make sure you discuss any questions you have with your health care provider. °Document Released: 04/04/2014 Document Revised: 08/20/2015 Document Reviewed: 01/22/2014 °Elsevier Interactive Patient Education © 2018 [employer] ° °

## 2017-11-17 NOTE — Progress Notes (Signed)
10:29 AM To return today at 1:00 for platelets.  Verbalized understanding.

## 2017-11-17 NOTE — Telephone Encounter (Signed)
Critical Value Platelets 5 ANC 0 Dr Marin Olp notified. Orders will be placed

## 2017-11-17 NOTE — Patient Instructions (Signed)
Implanted Port Home Guide An implanted port is a type of central line that is placed under the skin. Central lines are used to provide IV access when treatment or nutrition needs to be given through a person's veins. Implanted ports are used for long-term IV access. An implanted port may be placed because:  You need IV medicine that would be irritating to the small veins in your hands or arms.  You need long-term IV medicines, such as antibiotics.  You need IV nutrition for a long period.  You need frequent blood draws for lab tests.  You need dialysis.  Implanted ports are usually placed in the chest area, but they can also be placed in the upper arm, the abdomen, or the leg. An implanted port has two main parts:  Reservoir. The reservoir is round and will appear as a small, raised area under your skin. The reservoir is the part where a needle is inserted to give medicines or draw blood.  Catheter. The catheter is a thin, flexible tube that extends from the reservoir. The catheter is placed into a large vein. Medicine that is inserted into the reservoir goes into the catheter and then into the vein.  How will I care for my incision site? Do not get the incision site wet. Bathe or shower as directed by your health care provider. How is my port accessed? Special steps must be taken to access the port:  Before the port is accessed, a numbing cream can be placed on the skin. This helps numb the skin over the port site.  Your health care provider uses a sterile technique to access the port. ? Your health care provider must put on a mask and sterile gloves. ? The skin over your port is cleaned carefully with an antiseptic and allowed to dry. ? The port is gently pinched between sterile gloves, and a needle is inserted into the port.  Only "non-coring" port needles should be used to access the port. Once the port is accessed, a blood return should be checked. This helps ensure that the port  is in the vein and is not clogged.  If your port needs to remain accessed for a constant infusion, a clear (transparent) bandage will be placed over the needle site. The bandage and needle will need to be changed every week, or as directed by your health care provider.  Keep the bandage covering the needle clean and dry. Do not get it wet. Follow your health care provider's instructions on how to take a shower or bath while the port is accessed.  If your port does not need to stay accessed, no bandage is needed over the port.  What is flushing? Flushing helps keep the port from getting clogged. Follow your health care provider's instructions on how and when to flush the port. Ports are usually flushed with saline solution or a medicine called heparin. The need for flushing will depend on how the port is used.  If the port is used for intermittent medicines or blood draws, the port will need to be flushed: ? After medicines have been given. ? After blood has been drawn. ? As part of routine maintenance.  If a constant infusion is running, the port may not need to be flushed.  How long will my port stay implanted? The port can stay in for as long as your health care provider thinks it is needed. When it is time for the port to come out, surgery will be   done to remove it. The procedure is similar to the one performed when the port was put in. When should I seek immediate medical care? When you have an implanted port, you should seek immediate medical care if:  You notice a bad smell coming from the incision site.  You have swelling, redness, or drainage at the incision site.  You have more swelling or pain at the port site or the surrounding area.  You have a fever that is not controlled with medicine.  This information is not intended to replace advice given to you by your health care provider. Make sure you discuss any questions you have with your health care provider. Document  Released: 03/14/2005 Document Revised: 08/20/2015 Document Reviewed: 11/19/2012 Elsevier Interactive Patient Education  2017 Elsevier Inc.  

## 2017-11-18 LAB — IGG, IGA, IGM
IGG (IMMUNOGLOBIN G), SERUM: 194 mg/dL — AB (ref 700–1600)
IgA: 14 mg/dL — ABNORMAL LOW (ref 87–352)
IgM (Immunoglobulin M), Srm: 9 mg/dL — ABNORMAL LOW (ref 26–217)

## 2017-11-19 LAB — PREPARE PLATELET PHERESIS: Unit division: 0

## 2017-11-19 LAB — BPAM PLATELET PHERESIS
Blood Product Expiration Date: 201908242359
ISSUE DATE / TIME: 201908231100
Unit Type and Rh: 6200

## 2017-11-20 ENCOUNTER — Inpatient Hospital Stay: Payer: Medicare Other

## 2017-11-20 ENCOUNTER — Other Ambulatory Visit: Payer: Self-pay

## 2017-11-20 ENCOUNTER — Encounter: Payer: Self-pay | Admitting: Hematology & Oncology

## 2017-11-20 ENCOUNTER — Inpatient Hospital Stay (HOSPITAL_BASED_OUTPATIENT_CLINIC_OR_DEPARTMENT_OTHER): Payer: Medicare Other | Admitting: Hematology & Oncology

## 2017-11-20 ENCOUNTER — Telehealth: Payer: Self-pay | Admitting: *Deleted

## 2017-11-20 VITALS — BP 121/57 | HR 78 | Temp 97.9°F | Resp 19 | Wt 94.0 lb

## 2017-11-20 DIAGNOSIS — C9000 Multiple myeloma not having achieved remission: Secondary | ICD-10-CM | POA: Diagnosis not present

## 2017-11-20 DIAGNOSIS — R609 Edema, unspecified: Secondary | ICD-10-CM | POA: Diagnosis not present

## 2017-11-20 LAB — CMP (CANCER CENTER ONLY)
ALBUMIN: 3 g/dL — AB (ref 3.5–5.0)
ALT: 20 U/L (ref 10–47)
AST: 22 U/L (ref 11–38)
Alkaline Phosphatase: 65 U/L (ref 26–84)
Anion gap: 7 (ref 5–15)
BUN: 29 mg/dL — AB (ref 7–22)
CO2: 24 mmol/L (ref 18–33)
CREATININE: 1.6 mg/dL — AB (ref 0.60–1.20)
Calcium: 8.4 mg/dL (ref 8.0–10.3)
Chloride: 107 mmol/L (ref 98–108)
Glucose, Bld: 99 mg/dL (ref 73–118)
Potassium: 3.1 mmol/L — ABNORMAL LOW (ref 3.3–4.7)
SODIUM: 138 mmol/L (ref 128–145)
Total Bilirubin: 0.5 mg/dL (ref 0.2–1.6)
Total Protein: 5.3 g/dL — ABNORMAL LOW (ref 6.4–8.1)

## 2017-11-20 LAB — CBC WITH DIFFERENTIAL (CANCER CENTER ONLY)
BASOS ABS: 0 10*3/uL (ref 0.0–0.1)
Basophils Relative: 0 %
EOS ABS: 0 10*3/uL (ref 0.0–0.5)
Eosinophils Relative: 0 %
HCT: 20.3 % — ABNORMAL LOW (ref 34.8–46.6)
HEMOGLOBIN: 6.5 g/dL — AB (ref 11.6–15.9)
LYMPHS ABS: 0 10*3/uL — AB (ref 0.9–3.3)
LYMPHS PCT: 24 %
MCH: 28.9 pg (ref 26.0–34.0)
MCHC: 32 g/dL (ref 32.0–36.0)
MCV: 90.2 fL (ref 81.0–101.0)
Monocytes Absolute: 0 10*3/uL — ABNORMAL LOW (ref 0.1–0.9)
Monocytes Relative: 19 %
NEUTROS ABS: 0.2 10*3/uL — AB (ref 1.5–6.5)
Neutrophils Relative %: 57 %
Platelet Count: 23 10*3/uL — ABNORMAL LOW (ref 145–400)
RBC: 2.25 MIL/uL — ABNORMAL LOW (ref 3.70–5.32)
RDW: 14.6 % (ref 11.1–15.7)
WBC Count: 0.2 10*3/uL — CL (ref 3.9–10.0)

## 2017-11-20 LAB — PROTEIN ELECTROPHORESIS, SERUM, WITH REFLEX
A/G RATIO SPE: 1.8 — AB (ref 0.7–1.7)
ALBUMIN ELP: 3.2 g/dL (ref 2.9–4.4)
Alpha-1-Globulin: 0.2 g/dL (ref 0.0–0.4)
Alpha-2-Globulin: 0.7 g/dL (ref 0.4–1.0)
Beta Globulin: 0.7 g/dL (ref 0.7–1.3)
GLOBULIN, TOTAL: 1.8 g/dL — AB (ref 2.2–3.9)
Gamma Globulin: 0.1 g/dL — ABNORMAL LOW (ref 0.4–1.8)
Total Protein ELP: 5 g/dL — ABNORMAL LOW (ref 6.0–8.5)

## 2017-11-20 LAB — SAMPLE TO BLOOD BANK

## 2017-11-20 LAB — KAPPA/LAMBDA LIGHT CHAINS: Kappa free light chain: 376.4 mg/L — ABNORMAL HIGH (ref 3.3–19.4)

## 2017-11-20 MED ORDER — SODIUM CHLORIDE 0.9% FLUSH
10.0000 mL | INTRAVENOUS | Status: DC | PRN
  Administered 2017-11-20: 10 mL via INTRAVENOUS
  Filled 2017-11-20: qty 10

## 2017-11-20 MED ORDER — HEPARIN SOD (PORK) LOCK FLUSH 100 UNIT/ML IV SOLN
500.0000 [IU] | Freq: Once | INTRAVENOUS | Status: AC
  Administered 2017-11-20: 500 [IU] via INTRAVENOUS
  Filled 2017-11-20: qty 5

## 2017-11-20 NOTE — Progress Notes (Signed)
Hematology and Oncology Follow Up Visit  Jeanne Martinez [id number] Jul 24, 1950 67 y.o. 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 Novitsky is here today with her family for follow-up.  She continues to do amazingly well post chemotherapy.  She had her second cycle of VD-PACE.  She completed this about 10 days ago.  Again, I am amazed as how well she is done.  [name] has no blood counts.  We have had to transfuse her red blood cells and platelets.  She does get Neulasta.  Her last Kappa Lightchain was 400 mg/L.  This continues to improve.  Again, her goal is to try to get her down to [place] for their CAR-T program for myeloma.  I does do not know if she would qualify for this given her blood counts and her weight.  She is trying to eat a little bit more.  Her weight is 94 pounds.  She has had no fever.  She is on prophylactic antibiotics.  She is had no diarrhea.  There is been no mouth sores.  Overall, her performance status is ECOG 1.  Medications:  Allergies as of 11/20/2017   No Known Allergies     Medication List        Accurate as of 11/20/17 12:21 PM. Always use your most recent med list.          antiseptic oral rinse Liqd 15 mLs by Mouth Rinse route every 4 (four) hours.   dronabinol 2.5 MG capsule Commonly known as:  MARINOL Take 1 capsule (2.5 mg total) by mouth 3 (three) times daily before meals.   famciclovir 250 MG tablet Commonly known as:   FAMVIR TAKE 1 TABLET BY MOUTH EVERY DAY   fluconazole 100 MG tablet Commonly known as:  DIFLUCAN Take 1 tablet (100 mg total) by mouth daily.   levofloxacin 250 MG tablet Commonly known as:  LEVAQUIN Take 1 tablet (250 mg total) by mouth daily.   lidocaine-prilocaine cream Commonly known as:  EMLA Apply 1 application topically as needed. Apply to port one hour before appointment   LORazepam 0.5 MG tablet Commonly known as:  ATIVAN Take 1 tablet (0.5 mg total) by mouth every 6 (six) hours as needed (Nausea or vomiting).   ondansetron 8 MG tablet Commonly known as:  ZOFRAN Take 1 tablet (8 mg total) by mouth 2 (two) times daily as needed for refractory nausea / vomiting. Start on the second day after inpatient chemo completed.   prochlorperazine 10 MG tablet Commonly known as:  COMPAZINE Take 1 tablet (10 mg total) by mouth every 6 (six) hours as needed (Nausea or vomiting).       Allergies:  No Known Allergies  Past Medical History, Surgical history, Social history, and Family History were reviewed and updated.  Review of Systems: Review of Systems  Constitutional: Positive for weight loss.  HENT: Negative.   Eyes: Negative.   Respiratory: Negative.   Cardiovascular: Negative.   Gastrointestinal: Negative.   Genitourinary: Negative.  Musculoskeletal: Negative.   Skin: Negative.   Neurological: Negative.   Endo/Heme/Allergies: Negative.   Psychiatric/Behavioral: Negative.    Marland Kitchen   Physical Exam:  weight is 94 lb (42.6 kg). Her oral temperature is 97.9 F (36.6 C). Her blood pressure is 121/57 (abnormal) and her pulse is 78. Her respiration is 19 and oxygen saturation is 100%.   Wt Readings from Last 3 Encounters:  11/20/17 94 lb (42.6 kg)  11/13/17 98 lb 6 oz (44.6 kg)  11/10/17 105 lb 6.1 oz (47.8 kg)    Physical Exam  Constitutional: She is oriented to person, place, and time.  HENT:  Head: Normocephalic and atraumatic.  Mouth/Throat: Oropharynx is  clear and moist.  Eyes: Pupils are equal, round, and reactive to light. EOM are normal.  Neck: Normal range of motion.  Cardiovascular: Normal rate, regular rhythm and normal heart sounds.  Pulmonary/Chest: Effort normal and breath sounds normal.  Abdominal: Soft. Bowel sounds are normal.  Musculoskeletal: Normal range of motion. She exhibits no edema, tenderness or deformity.  Lymphadenopathy:    She has no cervical adenopathy.  Neurological: She is alert and oriented to person, place, and time.  Skin: Skin is warm and dry. No rash noted. No erythema.  Psychiatric: She has a normal mood and affect. Her behavior is normal. Judgment and thought content normal.  Vitals reviewed.    Lab Results  Component Value Date   WBC 0.2 (LL) 11/20/2017   HGB 6.5 (LL) 11/20/2017   HCT 20.3 (L) 11/20/2017   MCV 90.2 11/20/2017   PLT 23 (L) 11/20/2017   Lab Results  Component Value Date   FERRITIN 1,608 (H) 08/23/2017   IRON 98 08/23/2017   TIBC 210 (L) 08/23/2017   UIBC 112 08/23/2017   IRONPCTSAT 47 08/23/2017   Lab Results  Component Value Date   RBC 2.25 (L) 11/20/2017   Lab Results  Component Value Date   KPAFRELGTCHN 400.0 (H) 11/13/2017   LAMBDASER <1.5 (L) 11/13/2017   KAPLAMBRATIO See below. 11/13/2017   Lab Results  Component Value Date   IGGSERUM 194 (L) 11/17/2017   IGA 14 (L) 11/17/2017   IGMSERUM 9 (L) 11/17/2017   Lab Results  Component Value Date   TOTALPROTELP 4.9 (L) 10/30/2017   ALBUMINELP 2.8 (L) 10/30/2017   A1GS 0.3 10/30/2017   A2GS 0.9 10/30/2017   BETS 0.7 10/30/2017   GAMS 0.1 (L) 10/30/2017   MSPIKE 0.1 (H) 10/30/2017   SPEI Comment 10/06/2017     Chemistry      Component Value Date/Time   NA 138 11/20/2017 1028   NA 148 (H) 03/17/2017 1210   K 3.1 (L) 11/20/2017 1028   K 4.4 03/17/2017 1210   CL 107 11/20/2017 1028   CL 106 03/17/2017 1210   CO2 24 11/20/2017 1028   CO2 27 03/17/2017 1210   BUN 29 (H) 11/20/2017 1028   BUN 23 (H)  03/17/2017 1210   CREATININE 1.60 (H) 11/20/2017 1028   CREATININE 1.1 03/17/2017 1210      Component Value Date/Time   CALCIUM 8.4 11/20/2017 1028   CALCIUM 9.1 03/17/2017 1210   ALKPHOS 65 11/20/2017 1028   ALKPHOS 52 03/17/2017 1210   AST 22 11/20/2017 1028   ALT 20 11/20/2017 1028   ALT 21 03/17/2017 1210   BILITOT 0.5 11/20/2017 1028      Impression and Plan: Ms. Qualley is a very pleasant 67 yo caucasian female with IgA kappa myeloma with acute renal failure due to light  chain deposition.   We will continue to follow her blood counts 3 times a week.  I would not transfuse her today.  She does have little more edema in her legs but I think that this is worth while versus transfusing her.  We will see what her light chain level is.  She will come back Wednesday and Friday this week for labs.  I will plan to see her back next week.       Volanda Napoleon, MD 8/26/201912:21 PM

## 2017-11-20 NOTE — Patient Instructions (Signed)
Implanted Port Home Guide An implanted port is a type of central line that is placed under the skin. Central lines are used to provide IV access when treatment or nutrition needs to be given through a person's veins. Implanted ports are used for long-term IV access. An implanted port may be placed because:  You need IV medicine that would be irritating to the small veins in your hands or arms.  You need long-term IV medicines, such as antibiotics.  You need IV nutrition for a long period.  You need frequent blood draws for lab tests.  You need dialysis.  Implanted ports are usually placed in the chest area, but they can also be placed in the upper arm, the abdomen, or the leg. An implanted port has two main parts:  Reservoir. The reservoir is round and will appear as a small, raised area under your skin. The reservoir is the part where a needle is inserted to give medicines or draw blood.  Catheter. The catheter is a thin, flexible tube that extends from the reservoir. The catheter is placed into a large vein. Medicine that is inserted into the reservoir goes into the catheter and then into the vein.  How will I care for my incision site? Do not get the incision site wet. Bathe or shower as directed by your health care provider. How is my port accessed? Special steps must be taken to access the port:  Before the port is accessed, a numbing cream can be placed on the skin. This helps numb the skin over the port site.  Your health care provider uses a sterile technique to access the port. ? Your health care provider must put on a mask and sterile gloves. ? The skin over your port is cleaned carefully with an antiseptic and allowed to dry. ? The port is gently pinched between sterile gloves, and a needle is inserted into the port.  Only "non-coring" port needles should be used to access the port. Once the port is accessed, a blood return should be checked. This helps ensure that the port  is in the vein and is not clogged.  If your port needs to remain accessed for a constant infusion, a clear (transparent) bandage will be placed over the needle site. The bandage and needle will need to be changed every week, or as directed by your health care provider.  Keep the bandage covering the needle clean and dry. Do not get it wet. Follow your health care provider's instructions on how to take a shower or bath while the port is accessed.  If your port does not need to stay accessed, no bandage is needed over the port.  What is flushing? Flushing helps keep the port from getting clogged. Follow your health care provider's instructions on how and when to flush the port. Ports are usually flushed with saline solution or a medicine called heparin. The need for flushing will depend on how the port is used.  If the port is used for intermittent medicines or blood draws, the port will need to be flushed: ? After medicines have been given. ? After blood has been drawn. ? As part of routine maintenance.  If a constant infusion is running, the port may not need to be flushed.  How long will my port stay implanted? The port can stay in for as long as your health care provider thinks it is needed. When it is time for the port to come out, surgery will be   done to remove it. The procedure is similar to the one performed when the port was put in. When should I seek immediate medical care? When you have an implanted port, you should seek immediate medical care if:  You notice a bad smell coming from the incision site.  You have swelling, redness, or drainage at the incision site.  You have more swelling or pain at the port site or the surrounding area.  You have a fever that is not controlled with medicine.  This information is not intended to replace advice given to you by your health care provider. Make sure you discuss any questions you have with your health care provider. Document  Released: 03/14/2005 Document Revised: 08/20/2015 Document Reviewed: 11/19/2012 Elsevier Interactive Patient Education  2017 Elsevier Inc.  

## 2017-11-20 NOTE — Telephone Encounter (Signed)
Critical Value WBC 0.2 Hgb 6.5 Dr Marin Olp notified. No orders at this time

## 2017-11-22 ENCOUNTER — Telehealth: Payer: Self-pay | Admitting: *Deleted

## 2017-11-22 ENCOUNTER — Inpatient Hospital Stay: Payer: Medicare Other

## 2017-11-22 VITALS — BP 116/49 | HR 84 | Temp 98.4°F | Resp 19

## 2017-11-22 DIAGNOSIS — C9000 Multiple myeloma not having achieved remission: Secondary | ICD-10-CM | POA: Diagnosis not present

## 2017-11-22 DIAGNOSIS — Z95828 Presence of other vascular implants and grafts: Secondary | ICD-10-CM

## 2017-11-22 LAB — CMP (CANCER CENTER ONLY)
ALBUMIN: 2.9 g/dL — AB (ref 3.5–5.0)
ALK PHOS: 59 U/L (ref 26–84)
ALT: 15 U/L (ref 10–47)
AST: 20 U/L (ref 11–38)
Anion gap: 11 (ref 5–15)
BUN: 26 mg/dL — AB (ref 7–22)
CHLORIDE: 106 mmol/L (ref 98–108)
CO2: 24 mmol/L (ref 18–33)
CREATININE: 1.6 mg/dL — AB (ref 0.60–1.20)
Calcium: 8.5 mg/dL (ref 8.0–10.3)
Glucose, Bld: 107 mg/dL (ref 73–118)
Potassium: 3.1 mmol/L — ABNORMAL LOW (ref 3.3–4.7)
SODIUM: 141 mmol/L (ref 128–145)
Total Bilirubin: 0.4 mg/dL (ref 0.2–1.6)
Total Protein: 5.5 g/dL — ABNORMAL LOW (ref 6.4–8.1)

## 2017-11-22 LAB — CBC WITH DIFFERENTIAL (CANCER CENTER ONLY)
Basophils Absolute: 0 10*3/uL (ref 0.0–0.1)
Basophils Relative: 0 %
EOS PCT: 0 %
Eosinophils Absolute: 0 10*3/uL (ref 0.0–0.5)
HCT: 19 % — ABNORMAL LOW (ref 34.8–46.6)
Hemoglobin: 6.1 g/dL — CL (ref 11.6–15.9)
Lymphocytes Relative: 4 %
Lymphs Abs: 0.1 10*3/uL — ABNORMAL LOW (ref 0.9–3.3)
MCH: 29.2 pg (ref 26.0–34.0)
MCHC: 32.1 g/dL (ref 32.0–36.0)
MCV: 90.9 fL (ref 81.0–101.0)
MONO ABS: 0.3 10*3/uL (ref 0.1–0.9)
MONOS PCT: 12 %
Neutro Abs: 2.2 10*3/uL (ref 1.5–6.5)
Neutrophils Relative %: 84 %
PLATELETS: 11 10*3/uL — AB (ref 145–400)
RBC: 2.09 MIL/uL — ABNORMAL LOW (ref 3.70–5.32)
RDW: 14.5 % (ref 11.1–15.7)
WBC Count: 2.7 10*3/uL — ABNORMAL LOW (ref 3.9–10.0)

## 2017-11-22 LAB — SAMPLE TO BLOOD BANK

## 2017-11-22 MED ORDER — SODIUM CHLORIDE 0.9% FLUSH
10.0000 mL | INTRAVENOUS | Status: DC | PRN
  Administered 2017-11-22: 10 mL via INTRAVENOUS
  Filled 2017-11-22: qty 10

## 2017-11-22 MED ORDER — HEPARIN SOD (PORK) LOCK FLUSH 100 UNIT/ML IV SOLN
500.0000 [IU] | Freq: Once | INTRAVENOUS | Status: AC
  Administered 2017-11-22: 500 [IU] via INTRAVENOUS
  Filled 2017-11-22: qty 5

## 2017-11-22 NOTE — Telephone Encounter (Signed)
Critical Value Hgb 6.1 Dr Marin Olp notified. No orders at this time

## 2017-11-23 LAB — CBC WITH DIFFERENTIAL (CANCER CENTER ONLY)
BAND NEUTROPHILS: 0 %
BASOS PCT: 0 %
Basophils Absolute: 0 10*3/uL (ref 0.0–0.1)
Blasts: 0 %
EOS PCT: 0 %
Eosinophils Absolute: 0 10*3/uL (ref 0.0–0.5)
HEMATOCRIT: 22.9 % — AB (ref 34.8–46.6)
HEMOGLOBIN: 7.5 g/dL — AB (ref 11.6–15.9)
Lymphocytes Relative: 100 %
Lymphs Abs: 0 10*3/uL — ABNORMAL LOW (ref 0.9–3.3)
MCH: 29.2 pg (ref 26.0–34.0)
MCHC: 32.8 g/dL (ref 32.0–36.0)
MCV: 89.1 fL (ref 81.0–101.0)
MONOS PCT: 0 %
Metamyelocytes Relative: 0 %
Monocytes Absolute: 0 10*3/uL — ABNORMAL LOW (ref 0.1–0.9)
Myelocytes: 0 %
NEUTROS ABS: 0 10*3/uL — AB (ref 1.5–6.5)
Neutrophils Relative %: 0 %
Other: 0 %
Platelet Count: 9 10*3/uL — CL (ref 145–400)
Promyelocytes Relative: 0 %
RBC: 2.57 MIL/uL — AB (ref 3.70–5.32)
RDW: 14.4 % (ref 11.1–15.7)
WBC: 0 10*3/uL — AB (ref 3.9–10.0)
nRBC: 0 /100 WBC

## 2017-11-24 ENCOUNTER — Telehealth: Payer: Self-pay | Admitting: *Deleted

## 2017-11-24 ENCOUNTER — Inpatient Hospital Stay: Payer: Medicare Other

## 2017-11-24 ENCOUNTER — Other Ambulatory Visit: Payer: Self-pay

## 2017-11-24 VITALS — BP 119/71 | HR 90 | Temp 97.6°F | Resp 99

## 2017-11-24 DIAGNOSIS — C9000 Multiple myeloma not having achieved remission: Secondary | ICD-10-CM

## 2017-11-24 DIAGNOSIS — D696 Thrombocytopenia, unspecified: Secondary | ICD-10-CM

## 2017-11-24 LAB — CMP (CANCER CENTER ONLY)
ALK PHOS: 71 U/L (ref 26–84)
ALT: 18 U/L (ref 10–47)
AST: 23 U/L (ref 11–38)
Albumin: 3 g/dL — ABNORMAL LOW (ref 3.5–5.0)
Anion gap: 9 (ref 5–15)
BILIRUBIN TOTAL: 0.5 mg/dL (ref 0.2–1.6)
BUN: 21 mg/dL (ref 7–22)
CHLORIDE: 108 mmol/L (ref 98–108)
CO2: 24 mmol/L (ref 18–33)
CREATININE: 1.5 mg/dL — AB (ref 0.60–1.20)
Calcium: 8.3 mg/dL (ref 8.0–10.3)
Glucose, Bld: 130 mg/dL — ABNORMAL HIGH (ref 73–118)
POTASSIUM: 2.8 mmol/L — AB (ref 3.3–4.7)
Sodium: 141 mmol/L (ref 128–145)
Total Protein: 5.4 g/dL — ABNORMAL LOW (ref 6.4–8.1)

## 2017-11-24 LAB — CBC WITH DIFFERENTIAL (CANCER CENTER ONLY)
Basophils Absolute: 0 10*3/uL (ref 0.0–0.1)
Basophils Relative: 0 %
EOS PCT: 0 %
Eosinophils Absolute: 0 10*3/uL (ref 0.0–0.5)
HEMATOCRIT: 19.5 % — AB (ref 34.8–46.6)
HEMOGLOBIN: 6.2 g/dL — AB (ref 11.6–15.9)
LYMPHS ABS: 0.2 10*3/uL — AB (ref 0.9–3.3)
LYMPHS PCT: 3 %
MCH: 29 pg (ref 26.0–34.0)
MCHC: 31.8 g/dL — AB (ref 32.0–36.0)
MCV: 91.1 fL (ref 81.0–101.0)
MONO ABS: 0.8 10*3/uL (ref 0.1–0.9)
MONOS PCT: 10 %
NEUTROS ABS: 6.8 10*3/uL — AB (ref 1.5–6.5)
Neutrophils Relative %: 87 %
Platelet Count: 6 10*3/uL — CL (ref 145–400)
RBC: 2.14 MIL/uL — ABNORMAL LOW (ref 3.70–5.32)
RDW: 14.8 % (ref 11.1–15.7)
WBC Count: 7.7 10*3/uL (ref 3.9–10.0)

## 2017-11-24 LAB — SAMPLE TO BLOOD BANK

## 2017-11-24 MED ORDER — SODIUM CHLORIDE 0.9% IV SOLUTION
250.0000 mL | Freq: Once | INTRAVENOUS | Status: AC
  Administered 2017-11-24: 250 mL via INTRAVENOUS
  Filled 2017-11-24: qty 250

## 2017-11-24 MED ORDER — HEPARIN SOD (PORK) LOCK FLUSH 100 UNIT/ML IV SOLN
500.0000 [IU] | Freq: Every day | INTRAVENOUS | Status: AC | PRN
  Administered 2017-11-24: 500 [IU]
  Filled 2017-11-24: qty 5

## 2017-11-24 MED ORDER — SODIUM CHLORIDE 0.9% FLUSH
10.0000 mL | INTRAVENOUS | Status: AC | PRN
  Administered 2017-11-24: 10 mL
  Filled 2017-11-24: qty 10

## 2017-11-24 MED ORDER — SODIUM CHLORIDE 0.9 % IJ SOLN
10.0000 mL | Freq: Once | INTRAMUSCULAR | Status: AC
  Administered 2017-11-24: 10 mL
  Filled 2017-11-24: qty 10

## 2017-11-24 NOTE — Patient Instructions (Signed)
Implanted Port Home Guide An implanted port is a type of central line that is placed under the skin. Central lines are used to provide IV access when treatment or nutrition needs to be given through a person's veins. Implanted ports are used for long-term IV access. An implanted port may be placed because:  You need IV medicine that would be irritating to the small veins in your hands or arms.  You need long-term IV medicines, such as antibiotics.  You need IV nutrition for a long period.  You need frequent blood draws for lab tests.  You need dialysis.  Implanted ports are usually placed in the chest area, but they can also be placed in the upper arm, the abdomen, or the leg. An implanted port has two main parts:  Reservoir. The reservoir is round and will appear as a small, raised area under your skin. The reservoir is the part where a needle is inserted to give medicines or draw blood.  Catheter. The catheter is a thin, flexible tube that extends from the reservoir. The catheter is placed into a large vein. Medicine that is inserted into the reservoir goes into the catheter and then into the vein.  How will I care for my incision site? Do not get the incision site wet. Bathe or shower as directed by your health care provider. How is my port accessed? Special steps must be taken to access the port:  Before the port is accessed, a numbing cream can be placed on the skin. This helps numb the skin over the port site.  Your health care provider uses a sterile technique to access the port. ? Your health care provider must put on a mask and sterile gloves. ? The skin over your port is cleaned carefully with an antiseptic and allowed to dry. ? The port is gently pinched between sterile gloves, and a needle is inserted into the port.  Only "non-coring" port needles should be used to access the port. Once the port is accessed, a blood return should be checked. This helps ensure that the port  is in the vein and is not clogged.  If your port needs to remain accessed for a constant infusion, a clear (transparent) bandage will be placed over the needle site. The bandage and needle will need to be changed every week, or as directed by your health care provider.  Keep the bandage covering the needle clean and dry. Do not get it wet. Follow your health care provider's instructions on how to take a shower or bath while the port is accessed.  If your port does not need to stay accessed, no bandage is needed over the port.  What is flushing? Flushing helps keep the port from getting clogged. Follow your health care provider's instructions on how and when to flush the port. Ports are usually flushed with saline solution or a medicine called heparin. The need for flushing will depend on how the port is used.  If the port is used for intermittent medicines or blood draws, the port will need to be flushed: ? After medicines have been given. ? After blood has been drawn. ? As part of routine maintenance.  If a constant infusion is running, the port may not need to be flushed.  How long will my port stay implanted? The port can stay in for as long as your health care provider thinks it is needed. When it is time for the port to come out, surgery will be   done to remove it. The procedure is similar to the one performed when the port was put in. When should I seek immediate medical care? When you have an implanted port, you should seek immediate medical care if:  You notice a bad smell coming from the incision site.  You have swelling, redness, or drainage at the incision site.  You have more swelling or pain at the port site or the surrounding area.  You have a fever that is not controlled with medicine.  This information is not intended to replace advice given to you by your health care provider. Make sure you discuss any questions you have with your health care provider. Document  Released: 03/14/2005 Document Revised: 08/20/2015 Document Reviewed: 11/19/2012 Elsevier Interactive Patient Education  2017 Elsevier Inc.  

## 2017-11-24 NOTE — Patient Instructions (Signed)
Platelet Transfusion A platelet transfusion is a procedure in which you receive donated platelets through an IV tube. Platelets are tiny pieces of blood cells. When a blood vessel is damaged, platelets collect in the damaged area to help form a blood clot. This begins the healing process. If your platelet count gets too low, your blood may have trouble clotting. You may need a platelet transfusion if you have a condition that causes a low number of platelets (thrombocytopenia). A platelet transfusion may be used to stop or prevent bleeding. Tell a health care provider about:  Any allergies you have.  All medicines you are taking, including vitamins, herbs, eye drops, creams, and over-the-counter medicines.  Any problems you or family members have had with anesthetic medicines.  Any blood disorders you have.  Any surgeries you have had.  Any medical conditions you have.  Any reactions you have had during a previous transfusion. What are the risks? Generally, this is a safe procedure. However, problems may occur, including:  Fever with or without chills. The fever usually occurs within the first 4 hours of the transfusion and returns to normal within 48 hours.  Allergic reaction. The reaction is most commonly caused by antibodies your body creates against substances in the transfusion. Signs of an allergic reaction may include itching, hives, difficulty breathing, shock, or low blood pressure.  Sudden (acute) or delayed hemolytic reaction. This rare reaction can occur during the transfusion and up to 28 days after the transfusion. The reaction usually occurs when your body's defense system (immune system) attacks the new platelets. Signs of a hemolytic reaction may include fever, headache, difficulty breathing, low blood pressure, a rapid heartbeat, or pain in your back, abdomen, chest, or IV site.  Transfusion-related acute lung injury (TRALI). TRALI can occur within hours of a transfusion,  or several days later. This is a rare reaction that causes lung damage. The cause is not known.  Infection. Signs of this rare complication may include fever, chills, vomiting, a rapid heartbeat, or low blood pressure.  What happens before the procedure?  You may have a blood test to determine your blood type. This is necessary to find out what kind ofplatelets best matches your platelets.  If you have had an allergic reaction to a transfusion in the past, you may be given medicine to help prevent a reaction. Take this medicine only as directed by your health care provider.  Your temperature, blood pressure, and pulse will be monitored before the transfusion. What happens during the procedure?  An IV will be started in your hand or arm.  The transfusion will be attached to your IV tubing. The bag of donated platelets will be attached to your IV tube andgiven into your vein.  Your temperature, blood pressure, and pulse will be monitored regularly during the transfusion. This monitoring is done to help detect early signs of a transfusion reaction.  If you have any signs or symptoms of a reaction, your transfusion will be stopped and you may be given medicine.  When your transfusion is complete, your IV will be removed.  Pressure may be applied to the IV site for a few minutes.  A bandage (dressing) will be applied. The procedure may vary among health care providers and hospitals. What happens after the procedure?  Your blood pressure, temperature, and pulse will be monitored regularly. This information is not intended to replace advice given to you by your health care provider. Make sure you discuss any questions you have   with your health care provider. Document Released: 01/09/2007 Document Revised: 08/20/2015 Document Reviewed: 01/22/2014 Elsevier Interactive Patient Education  2018 [employer]  

## 2017-11-24 NOTE — Telephone Encounter (Signed)
Critical Value Platelets 6 Hgb 6.2 Dr Marin Olp notified. Orders will be placed.

## 2017-11-24 NOTE — Progress Notes (Signed)
Patient coming back in at 1 pm today for platelet transfusion. Patient and spouse verbalized understanding.

## 2017-11-27 LAB — BPAM PLATELET PHERESIS
Blood Product Expiration Date: 201908312359
ISSUE DATE / TIME: 201908301029
Unit Type and Rh: 6200

## 2017-11-27 LAB — PREPARE PLATELET PHERESIS: UNIT DIVISION: 0

## 2017-11-28 ENCOUNTER — Other Ambulatory Visit: Payer: Self-pay | Admitting: *Deleted

## 2017-11-28 ENCOUNTER — Encounter: Payer: Self-pay | Admitting: Hematology & Oncology

## 2017-11-28 ENCOUNTER — Inpatient Hospital Stay: Payer: Medicare Other | Attending: Hematology & Oncology | Admitting: Hematology & Oncology

## 2017-11-28 ENCOUNTER — Inpatient Hospital Stay: Payer: Medicare Other

## 2017-11-28 ENCOUNTER — Telehealth: Payer: Self-pay | Admitting: *Deleted

## 2017-11-28 ENCOUNTER — Other Ambulatory Visit: Payer: Self-pay

## 2017-11-28 VITALS — BP 132/70 | HR 98 | Temp 97.8°F | Resp 18 | Wt 93.2 lb

## 2017-11-28 DIAGNOSIS — C9002 Multiple myeloma in relapse: Secondary | ICD-10-CM | POA: Insufficient documentation

## 2017-11-28 DIAGNOSIS — C9000 Multiple myeloma not having achieved remission: Secondary | ICD-10-CM

## 2017-11-28 DIAGNOSIS — N179 Acute kidney failure, unspecified: Secondary | ICD-10-CM | POA: Insufficient documentation

## 2017-11-28 LAB — COMPREHENSIVE METABOLIC PANEL
ALT: 13 U/L (ref 0–44)
ANION GAP: 11 (ref 5–15)
AST: 29 U/L (ref 15–41)
Albumin: 3.3 g/dL — ABNORMAL LOW (ref 3.5–5.0)
Alkaline Phosphatase: 63 U/L (ref 38–126)
BILIRUBIN TOTAL: 0.4 mg/dL (ref 0.3–1.2)
BUN: 21 mg/dL (ref 8–23)
CALCIUM: 7.8 mg/dL — AB (ref 8.9–10.3)
CO2: 23 mmol/L (ref 22–32)
CREATININE: 1.85 mg/dL — AB (ref 0.44–1.00)
Chloride: 107 mmol/L (ref 98–111)
GFR calc non Af Amer: 27 mL/min — ABNORMAL LOW (ref >60)
GFR, EST AFRICAN AMERICAN: 31 mL/min — AB (ref >60)
GLUCOSE: 121 mg/dL — AB (ref 70–99)
Potassium: 3 mmol/L — ABNORMAL LOW (ref 3.5–5.1)
Sodium: 141 mmol/L (ref 135–145)
TOTAL PROTEIN: 5.6 g/dL — AB (ref 6.5–8.1)

## 2017-11-28 LAB — CBC WITH DIFFERENTIAL (CANCER CENTER ONLY)
Basophils Absolute: 0 10*3/uL (ref 0.0–0.1)
Basophils Relative: 0 %
EOS PCT: 0 %
Eosinophils Absolute: 0 10*3/uL (ref 0.0–0.5)
HCT: 17.4 % — ABNORMAL LOW (ref 34.8–46.6)
Hemoglobin: 5.7 g/dL — CL (ref 11.6–15.9)
LYMPHS ABS: 0.4 10*3/uL — AB (ref 0.9–3.3)
LYMPHS PCT: 7 %
MCH: 29.7 pg (ref 26.0–34.0)
MCHC: 32.8 g/dL (ref 32.0–36.0)
MCV: 90.6 fL (ref 81.0–101.0)
MONO ABS: 0.7 10*3/uL (ref 0.1–0.9)
MONOS PCT: 12 %
Neutro Abs: 4.7 10*3/uL (ref 1.5–6.5)
Neutrophils Relative %: 81 %
PLATELETS: 17 10*3/uL — AB (ref 145–400)
RBC: 1.92 MIL/uL — AB (ref 3.70–5.32)
RDW: 14.7 % (ref 11.1–15.7)
WBC: 5.7 10*3/uL (ref 3.9–10.0)

## 2017-11-28 LAB — PREPARE RBC (CROSSMATCH)

## 2017-11-28 LAB — SAMPLE TO BLOOD BANK

## 2017-11-28 NOTE — Telephone Encounter (Signed)
Critical Value Hgb 5.7 Dr Marin Olp notified. Orders will be placed

## 2017-11-28 NOTE — Progress Notes (Signed)
Hematology and Oncology Follow Up Visit  Jeanne Martinez [id number] 04-01-1950 67 y.o. 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  She had a good Labor Day weekend.    She continues to do amazingly well post chemotherapy.  She had her second cycle of VD-PACE.  She completed this about 2 weeks ago.  Again, I am amazed as how well she is done.  She Jeanne Martinez has no blood counts.  We have had to transfuse her red blood cells and platelets.  She does get Neulasta.  Her last Kappa Lightchain was 376 mg/L.  This continues to improve.  Again, her goal is to try to get her down to [place] for their CAR-T program for myeloma.  I does do not know if she would qualify for this given her blood counts and her weight.  She is trying to eat a little bit more.  Her weight is 94 pounds.  She has had no fever.  She is on prophylactic antibiotics.  She is had no diarrhea.  There is been no mouth sores.  Overall, her performance status is ECOG 1.  Medications:  Allergies as of 11/28/2017   No Known Allergies     Medication List        Accurate as of 11/28/17 10:00 AM. Always use your most recent med list.          antiseptic oral rinse Liqd 15 mLs by Mouth Rinse route every 4 (four) hours.   dronabinol 2.5 MG capsule Commonly known as:  MARINOL Take 1 capsule (2.5 mg total) by mouth 3 (three) times daily before meals.   famciclovir 250 MG  tablet Commonly known as:  FAMVIR TAKE 1 TABLET BY MOUTH EVERY DAY   fluconazole 100 MG tablet Commonly known as:  DIFLUCAN Take 1 tablet (100 mg total) by mouth daily.   levofloxacin 250 MG tablet Commonly known as:  LEVAQUIN Take 1 tablet (250 mg total) by mouth daily.   lidocaine-prilocaine cream Commonly known as:  EMLA Apply 1 application topically as needed. Apply to port one hour before appointment   LORazepam 0.5 MG tablet Commonly known as:  ATIVAN Take 1 tablet (0.5 mg total) by mouth every 6 (six) hours as needed (Nausea or vomiting).   ondansetron 8 MG tablet Commonly known as:  ZOFRAN Take 1 tablet (8 mg total) by mouth 2 (two) times daily as needed for refractory nausea / vomiting. Start on the second day after inpatient chemo completed.   prochlorperazine 10 MG tablet Commonly known as:  COMPAZINE Take 1 tablet (10 mg total) by mouth every 6 (six) hours as needed (Nausea or vomiting).       Allergies:  No Known Allergies  Past Medical History, Surgical history, Social history, and Family History were reviewed and updated.  Review of Systems: Review of Systems  Constitutional: Positive for weight loss.  HENT: Negative.   Eyes: Negative.   Respiratory: Negative.   Cardiovascular: Negative.  Gastrointestinal: Negative.   Genitourinary: Negative.   Musculoskeletal: Negative.   Skin: Negative.   Neurological: Negative.   Endo/Heme/Allergies: Negative.   Psychiatric/Behavioral: Negative.    Marland Kitchen   Physical Exam:  weight is 93 lb 4 oz (42.3 kg). Her oral temperature is 97.8 F (36.6 C). Her blood pressure is 132/70 and her pulse is 98. Her respiration is 18.   Wt Readings from Last 3 Encounters:  11/28/17 93 lb 4 oz (42.3 kg)  11/20/17 94 lb (42.6 kg)  11/13/17 98 lb 6 oz (44.6 kg)    Physical Exam  Constitutional: She is oriented to person, place, and time.  HENT:  Head: Normocephalic and atraumatic.  Mouth/Throat: Oropharynx is clear and  moist.  Eyes: Pupils are equal, round, and reactive to light. EOM are normal.  Neck: Normal range of motion.  Cardiovascular: Normal rate, regular rhythm and normal heart sounds.  Pulmonary/Chest: Effort normal and breath sounds normal.  Abdominal: Soft. Bowel sounds are normal.  Musculoskeletal: Normal range of motion. She exhibits no edema, tenderness or deformity.  Lymphadenopathy:    She has no cervical adenopathy.  Neurological: She is alert and oriented to person, place, and time.  Skin: Skin is warm and dry. No rash noted. No erythema.  Psychiatric: She has a normal mood and affect. Her behavior is normal. Judgment and thought content normal.  Vitals reviewed.    Lab Results  Component Value Date   WBC 5.7 11/28/2017   HGB 5.7 (LL) 11/28/2017   HCT 17.4 (L) 11/28/2017   MCV 90.6 11/28/2017   PLT 17 (L) 11/28/2017   Lab Results  Component Value Date   FERRITIN 1,608 (H) 08/23/2017   IRON 98 08/23/2017   TIBC 210 (L) 08/23/2017   UIBC 112 08/23/2017   IRONPCTSAT 47 08/23/2017   Lab Results  Component Value Date   RBC 1.92 (L) 11/28/2017   Lab Results  Component Value Date   KPAFRELGTCHN 376.4 (H) 11/17/2017   LAMBDASER <1.5 (L) 11/17/2017   KAPLAMBRATIO See below. 11/17/2017   Lab Results  Component Value Date   IGGSERUM 194 (L) 11/17/2017   IGA 14 (L) 11/17/2017   IGMSERUM 9 (L) 11/17/2017   Lab Results  Component Value Date   TOTALPROTELP 5.0 (L) 11/17/2017   ALBUMINELP 3.2 11/17/2017   A1GS 0.2 11/17/2017   A2GS 0.7 11/17/2017   BETS 0.7 11/17/2017   GAMS 0.1 (L) 11/17/2017   MSPIKE Not Observed 11/17/2017   SPEI Comment 10/06/2017     Chemistry      Component Value Date/Time   NA 141 11/24/2017 0920   NA 148 (H) 03/17/2017 1210   K 2.8 (LL) 11/24/2017 0920   K 4.4 03/17/2017 1210   CL 108 11/24/2017 0920   CL 106 03/17/2017 1210   CO2 24 11/24/2017 0920   CO2 27 03/17/2017 1210   BUN 21 11/24/2017 0920   BUN 23 (H) 03/17/2017 1210    CREATININE 1.50 (H) 11/24/2017 0920   CREATININE 1.1 03/17/2017 1210      Component Value Date/Time   CALCIUM 8.3 11/24/2017 0920   CALCIUM 9.1 03/17/2017 1210   ALKPHOS 71 11/24/2017 0920   ALKPHOS 52 03/17/2017 1210   AST 23 11/24/2017 0920   ALT 18 11/24/2017 0920   ALT 21 03/17/2017 1210   BILITOT 0.5 11/24/2017 0920      Impression and Plan: Ms. Jian is a very pleasant 67 yo caucasian female with IgA kappa myeloma with acute renal failure due to  light chain deposition.   Her platelet count is up.  Hopefully, this will continue to go up.  Am still not sure she is ready for CAR-T therapy.  It would be very close as whether not she will qualify for this.  I think this is her only hope for some type of long-term remission.  She will need to have a blood transfusion.  Given that her white cell count being up, I will stop the Diflucan and the Levaquin.  I will continue the Famvir.  I will see her back in 10 days now.    Volanda Napoleon, MD 9/3/201910:00 AM

## 2017-11-29 ENCOUNTER — Inpatient Hospital Stay: Payer: Medicare Other

## 2017-11-29 DIAGNOSIS — C9002 Multiple myeloma in relapse: Secondary | ICD-10-CM | POA: Diagnosis not present

## 2017-11-29 DIAGNOSIS — D696 Thrombocytopenia, unspecified: Secondary | ICD-10-CM

## 2017-11-29 DIAGNOSIS — C9000 Multiple myeloma not having achieved remission: Secondary | ICD-10-CM

## 2017-11-29 LAB — PROTEIN ELECTROPHORESIS, SERUM, WITH REFLEX
A/G Ratio: 1.6 (ref 0.7–1.7)
ALPHA-2-GLOBULIN: 0.8 g/dL (ref 0.4–1.0)
Albumin ELP: 3.1 g/dL (ref 2.9–4.4)
Alpha-1-Globulin: 0.3 g/dL (ref 0.0–0.4)
BETA GLOBULIN: 0.6 g/dL — AB (ref 0.7–1.3)
GLOBULIN, TOTAL: 1.9 g/dL — AB (ref 2.2–3.9)
Gamma Globulin: 0.2 g/dL — ABNORMAL LOW (ref 0.4–1.8)
Total Protein ELP: 5 g/dL — ABNORMAL LOW (ref 6.0–8.5)

## 2017-11-29 LAB — KAPPA/LAMBDA LIGHT CHAINS
Kappa free light chain: 318.2 mg/L — ABNORMAL HIGH (ref 3.3–19.4)
Kappa, lambda light chain ratio: 41.87 — ABNORMAL HIGH (ref 0.26–1.65)
Lambda free light chains: 7.6 mg/L (ref 5.7–26.3)

## 2017-11-29 LAB — IGG, IGA, IGM
IGA: 20 mg/dL — AB (ref 87–352)
IGM (IMMUNOGLOBULIN M), SRM: 9 mg/dL — AB (ref 26–217)
IgG (Immunoglobin G), Serum: 239 mg/dL — ABNORMAL LOW (ref 700–1600)

## 2017-11-29 MED ORDER — SODIUM CHLORIDE 0.9% FLUSH
10.0000 mL | INTRAVENOUS | Status: AC | PRN
  Administered 2017-11-29: 10 mL
  Filled 2017-11-29: qty 10

## 2017-11-29 MED ORDER — ACETAMINOPHEN 325 MG PO TABS
650.0000 mg | ORAL_TABLET | Freq: Once | ORAL | Status: AC
  Administered 2017-11-29: 650 mg via ORAL

## 2017-11-29 MED ORDER — DIPHENHYDRAMINE HCL 25 MG PO CAPS
ORAL_CAPSULE | ORAL | Status: AC
  Filled 2017-11-29: qty 1

## 2017-11-29 MED ORDER — SODIUM CHLORIDE 0.9% IV SOLUTION
250.0000 mL | Freq: Once | INTRAVENOUS | Status: AC
  Administered 2017-11-29: 250 mL via INTRAVENOUS
  Filled 2017-11-29: qty 250

## 2017-11-29 MED ORDER — ACETAMINOPHEN 325 MG PO TABS
ORAL_TABLET | ORAL | Status: AC
  Filled 2017-11-29: qty 2

## 2017-11-29 MED ORDER — DIPHENHYDRAMINE HCL 25 MG PO CAPS
25.0000 mg | ORAL_CAPSULE | Freq: Once | ORAL | Status: AC
  Administered 2017-11-29: 25 mg via ORAL

## 2017-11-29 MED ORDER — HEPARIN SOD (PORK) LOCK FLUSH 100 UNIT/ML IV SOLN
500.0000 [IU] | Freq: Every day | INTRAVENOUS | Status: AC | PRN
  Administered 2017-11-29: 500 [IU]
  Filled 2017-11-29: qty 5

## 2017-11-29 MED ORDER — FUROSEMIDE 10 MG/ML IJ SOLN: 20.0000 mg | Freq: Once | INTRAMUSCULAR | Status: DC

## 2017-11-29 NOTE — Patient Instructions (Signed)

## 2017-11-30 ENCOUNTER — Inpatient Hospital Stay: Payer: Medicare Other

## 2017-11-30 LAB — TYPE AND SCREEN
ABO/RH(D): A POS
Antibody Screen: NEGATIVE
UNIT DIVISION: 0
Unit division: 0

## 2017-11-30 LAB — BPAM RBC
Blood Product Expiration Date: 201910092359
Blood Product Expiration Date: 201910092359
ISSUE DATE / TIME: 201909040754
ISSUE DATE / TIME: 201909040754
Unit Type and Rh: 9500
Unit Type and Rh: 9500

## 2017-12-01 ENCOUNTER — Other Ambulatory Visit: Payer: Self-pay

## 2017-12-01 ENCOUNTER — Inpatient Hospital Stay: Payer: Medicare Other

## 2017-12-01 DIAGNOSIS — C9000 Multiple myeloma not having achieved remission: Secondary | ICD-10-CM

## 2017-12-01 DIAGNOSIS — C9002 Multiple myeloma in relapse: Secondary | ICD-10-CM | POA: Diagnosis not present

## 2017-12-01 LAB — CMP (CANCER CENTER ONLY)
ALT: 21 U/L (ref 10–47)
ANION GAP: 1 — AB (ref 5–15)
AST: 30 U/L (ref 11–38)
Albumin: 3.2 g/dL — ABNORMAL LOW (ref 3.5–5.0)
Alkaline Phosphatase: 69 U/L (ref 26–84)
BUN: 18 mg/dL (ref 7–22)
CHLORIDE: 112 mmol/L — AB (ref 98–108)
CO2: 24 mmol/L (ref 18–33)
Calcium: 8.3 mg/dL (ref 8.0–10.3)
Creatinine: 1.6 mg/dL — ABNORMAL HIGH (ref 0.60–1.20)
GLUCOSE: 112 mg/dL (ref 73–118)
POTASSIUM: 2.9 mmol/L — AB (ref 3.3–4.7)
Sodium: 137 mmol/L (ref 128–145)
Total Bilirubin: 0.4 mg/dL (ref 0.2–1.6)
Total Protein: 5.5 g/dL — ABNORMAL LOW (ref 6.4–8.1)

## 2017-12-01 LAB — CBC WITH DIFFERENTIAL (CANCER CENTER ONLY)
Basophils Absolute: 0 10*3/uL (ref 0.0–0.1)
Basophils Relative: 0 %
EOS ABS: 0 10*3/uL (ref 0.0–0.5)
EOS PCT: 0 %
HCT: 27.3 % — ABNORMAL LOW (ref 34.8–46.6)
Hemoglobin: 9 g/dL — ABNORMAL LOW (ref 11.6–15.9)
LYMPHS ABS: 0.5 10*3/uL — AB (ref 0.9–3.3)
LYMPHS PCT: 9 %
MCH: 29.1 pg (ref 26.0–34.0)
MCHC: 33 g/dL (ref 32.0–36.0)
MCV: 88.3 fL (ref 81.0–101.0)
MONO ABS: 0.7 10*3/uL (ref 0.1–0.9)
Monocytes Relative: 11 %
Neutro Abs: 4.6 10*3/uL (ref 1.5–6.5)
Neutrophils Relative %: 80 %
PLATELETS: 17 10*3/uL — AB (ref 145–400)
RBC: 3.09 MIL/uL — AB (ref 3.70–5.32)
RDW: 16.3 % — AB (ref 11.1–15.7)
WBC: 5.8 10*3/uL (ref 3.9–10.0)

## 2017-12-01 LAB — SAMPLE TO BLOOD BANK

## 2017-12-01 MED ORDER — POTASSIUM CHLORIDE CRYS ER 20 MEQ PO TBCR: 20.0000 meq | EXTENDED_RELEASE_TABLET | Freq: Every day | ORAL | 0 refills | Status: DC

## 2017-12-01 NOTE — Patient Instructions (Signed)
Implanted Port Home Guide An implanted port is a type of central line that is placed under the skin. Central lines are used to provide IV access when treatment or nutrition needs to be given through a person's veins. Implanted ports are used for long-term IV access. An implanted port may be placed because:  You need IV medicine that would be irritating to the small veins in your hands or arms.  You need long-term IV medicines, such as antibiotics.  You need IV nutrition for a long period.  You need frequent blood draws for lab tests.  You need dialysis.  Implanted ports are usually placed in the chest area, but they can also be placed in the upper arm, the abdomen, or the leg. An implanted port has two main parts:  Reservoir. The reservoir is round and will appear as a small, raised area under your skin. The reservoir is the part where a needle is inserted to give medicines or draw blood.  Catheter. The catheter is a thin, flexible tube that extends from the reservoir. The catheter is placed into a large vein. Medicine that is inserted into the reservoir goes into the catheter and then into the vein.  How will I care for my incision site? Do not get the incision site wet. Bathe or shower as directed by your health care provider. How is my port accessed? Special steps must be taken to access the port:  Before the port is accessed, a numbing cream can be placed on the skin. This helps numb the skin over the port site.  Your health care provider uses a sterile technique to access the port. ? Your health care provider must put on a mask and sterile gloves. ? The skin over your port is cleaned carefully with an antiseptic and allowed to dry. ? The port is gently pinched between sterile gloves, and a needle is inserted into the port.  Only "non-coring" port needles should be used to access the port. Once the port is accessed, a blood return should be checked. This helps ensure that the port  is in the vein and is not clogged.  If your port needs to remain accessed for a constant infusion, a clear (transparent) bandage will be placed over the needle site. The bandage and needle will need to be changed every week, or as directed by your health care provider.  Keep the bandage covering the needle clean and dry. Do not get it wet. Follow your health care provider's instructions on how to take a shower or bath while the port is accessed.  If your port does not need to stay accessed, no bandage is needed over the port.  What is flushing? Flushing helps keep the port from getting clogged. Follow your health care provider's instructions on how and when to flush the port. Ports are usually flushed with saline solution or a medicine called heparin. The need for flushing will depend on how the port is used.  If the port is used for intermittent medicines or blood draws, the port will need to be flushed: ? After medicines have been given. ? After blood has been drawn. ? As part of routine maintenance.  If a constant infusion is running, the port may not need to be flushed.  How long will my port stay implanted? The port can stay in for as long as your health care provider thinks it is needed. When it is time for the port to come out, surgery will be   done to remove it. The procedure is similar to the one performed when the port was put in. When should I seek immediate medical care? When you have an implanted port, you should seek immediate medical care if:  You notice a bad smell coming from the incision site.  You have swelling, redness, or drainage at the incision site.  You have more swelling or pain at the port site or the surrounding area.  You have a fever that is not controlled with medicine.  This information is not intended to replace advice given to you by your health care provider. Make sure you discuss any questions you have with your health care provider. Document  Released: 03/14/2005 Document Revised: 08/20/2015 Document Reviewed: 11/19/2012 Elsevier Interactive Patient Education  2017 Elsevier Inc.  

## 2017-12-01 NOTE — Progress Notes (Signed)
Dr Marin Olp aware of low plalelets and panic low potassium. Script called for potassium. No other orders. dph

## 2017-12-04 ENCOUNTER — Inpatient Hospital Stay: Payer: Medicare Other

## 2017-12-04 ENCOUNTER — Telehealth: Payer: Self-pay | Admitting: *Deleted

## 2017-12-04 DIAGNOSIS — C9000 Multiple myeloma not having achieved remission: Secondary | ICD-10-CM

## 2017-12-04 DIAGNOSIS — Z95828 Presence of other vascular implants and grafts: Secondary | ICD-10-CM

## 2017-12-04 DIAGNOSIS — C9002 Multiple myeloma in relapse: Secondary | ICD-10-CM | POA: Diagnosis not present

## 2017-12-04 LAB — CMP (CANCER CENTER ONLY)
ALK PHOS: 61 U/L (ref 26–84)
ALT: 25 U/L (ref 10–47)
AST: 26 U/L (ref 11–38)
Albumin: 3.1 g/dL — ABNORMAL LOW (ref 3.5–5.0)
Anion gap: 0 — ABNORMAL LOW (ref 5–15)
BUN: 19 mg/dL (ref 7–22)
CALCIUM: 8.9 mg/dL (ref 8.0–10.3)
CO2: 26 mmol/L (ref 18–33)
Chloride: 110 mmol/L — ABNORMAL HIGH (ref 98–108)
Creatinine: 1.4 mg/dL — ABNORMAL HIGH (ref 0.60–1.20)
GLUCOSE: 121 mg/dL — AB (ref 73–118)
Potassium: 2.8 mmol/L — CL (ref 3.3–4.7)
SODIUM: 134 mmol/L (ref 128–145)
TOTAL PROTEIN: 5.5 g/dL — AB (ref 6.4–8.1)
Total Bilirubin: 0.5 mg/dL (ref 0.2–1.6)

## 2017-12-04 LAB — CBC WITH DIFFERENTIAL (CANCER CENTER ONLY)
BASOS ABS: 0 10*3/uL (ref 0.0–0.1)
Basophils Relative: 0 %
EOS ABS: 0 10*3/uL (ref 0.0–0.5)
Eosinophils Relative: 0 %
HCT: 28.1 % — ABNORMAL LOW (ref 34.8–46.6)
HEMOGLOBIN: 9.2 g/dL — AB (ref 11.6–15.9)
LYMPHS PCT: 9 %
Lymphs Abs: 0.5 10*3/uL — ABNORMAL LOW (ref 0.9–3.3)
MCH: 29.2 pg (ref 26.0–34.0)
MCHC: 32.7 g/dL (ref 32.0–36.0)
MCV: 89.2 fL (ref 81.0–101.0)
Monocytes Absolute: 0.9 10*3/uL (ref 0.1–0.9)
Monocytes Relative: 16 %
NEUTROS PCT: 75 %
Neutro Abs: 4.1 10*3/uL (ref 1.5–6.5)
PLATELETS: 38 10*3/uL — AB (ref 145–400)
RBC: 3.15 MIL/uL — AB (ref 3.70–5.32)
RDW: 16 % — ABNORMAL HIGH (ref 11.1–15.7)
WBC: 5.5 10*3/uL (ref 3.9–10.0)

## 2017-12-04 LAB — SAMPLE TO BLOOD BANK

## 2017-12-04 MED ORDER — SODIUM CHLORIDE 0.9% FLUSH
10.0000 mL | INTRAVENOUS | Status: AC | PRN
  Administered 2017-12-04: 10 mL via INTRAVENOUS
  Filled 2017-12-04: qty 10

## 2017-12-04 MED ORDER — HEPARIN SOD (PORK) LOCK FLUSH 100 UNIT/ML IV SOLN
500.0000 [IU] | Freq: Once | INTRAVENOUS | Status: AC
  Administered 2017-12-04: 500 [IU] via INTRAVENOUS
  Filled 2017-12-04: qty 5

## 2017-12-04 NOTE — Telephone Encounter (Signed)
Critical Value Potassium 2.8 Dr Marin Olp notified. Patient is to take potassium 33meq daily. Patient is aware of directions; confirmed with [employer].

## 2017-12-04 NOTE — Addendum Note (Signed)
Addended by: Smiley Houseman F on: 12/04/2017 12:01 PM   Modules accepted: Orders, SmartSet

## 2017-12-06 ENCOUNTER — Inpatient Hospital Stay: Payer: Medicare Other

## 2017-12-06 DIAGNOSIS — C9002 Multiple myeloma in relapse: Secondary | ICD-10-CM | POA: Diagnosis not present

## 2017-12-06 DIAGNOSIS — Z95828 Presence of other vascular implants and grafts: Secondary | ICD-10-CM

## 2017-12-06 DIAGNOSIS — C9000 Multiple myeloma not having achieved remission: Secondary | ICD-10-CM

## 2017-12-06 LAB — CMP (CANCER CENTER ONLY)
ALT: 26 U/L (ref 10–47)
AST: 31 U/L (ref 11–38)
Albumin: 3 g/dL — ABNORMAL LOW (ref 3.5–5.0)
Alkaline Phosphatase: 60 U/L (ref 26–84)
Anion gap: 2 — ABNORMAL LOW (ref 5–15)
BILIRUBIN TOTAL: 0.5 mg/dL (ref 0.2–1.6)
BUN: 19 mg/dL (ref 7–22)
CALCIUM: 9.4 mg/dL (ref 8.0–10.3)
CHLORIDE: 116 mmol/L — AB (ref 98–108)
CO2: 24 mmol/L (ref 18–33)
CREATININE: 1.3 mg/dL — AB (ref 0.60–1.20)
Glucose, Bld: 105 mg/dL (ref 73–118)
Potassium: 3.7 mmol/L (ref 3.3–4.7)
SODIUM: 142 mmol/L (ref 128–145)
Total Protein: 5.5 g/dL — ABNORMAL LOW (ref 6.4–8.1)

## 2017-12-06 LAB — CBC WITH DIFFERENTIAL (CANCER CENTER ONLY)
BASOS ABS: 0 10*3/uL (ref 0.0–0.1)
Basophils Relative: 1 %
EOS ABS: 0 10*3/uL (ref 0.0–0.5)
Eosinophils Relative: 0 %
HEMATOCRIT: 26.2 % — AB (ref 34.8–46.6)
HEMOGLOBIN: 8.4 g/dL — AB (ref 11.6–15.9)
Lymphocytes Relative: 14 %
Lymphs Abs: 0.8 10*3/uL — ABNORMAL LOW (ref 0.9–3.3)
MCH: 29.1 pg (ref 26.0–34.0)
MCHC: 32.1 g/dL (ref 32.0–36.0)
MCV: 90.7 fL (ref 81.0–101.0)
MONO ABS: 1 10*3/uL — AB (ref 0.1–0.9)
Monocytes Relative: 19 %
NEUTROS ABS: 3.6 10*3/uL (ref 1.5–6.5)
NEUTROS PCT: 66 %
Platelet Count: 47 10*3/uL — ABNORMAL LOW (ref 145–400)
RBC: 2.89 MIL/uL — ABNORMAL LOW (ref 3.70–5.32)
RDW: 16.3 % — AB (ref 11.1–15.7)
WBC Count: 5.4 10*3/uL (ref 3.9–10.0)

## 2017-12-06 LAB — SAMPLE TO BLOOD BANK

## 2017-12-06 MED ORDER — SODIUM CHLORIDE 0.9% FLUSH
10.0000 mL | INTRAVENOUS | Status: DC | PRN
  Administered 2017-12-06: 10 mL via INTRAVENOUS
  Filled 2017-12-06: qty 10

## 2017-12-06 MED ORDER — HEPARIN SOD (PORK) LOCK FLUSH 100 UNIT/ML IV SOLN
500.0000 [IU] | Freq: Once | INTRAVENOUS | Status: AC
  Administered 2017-12-06: 500 [IU] via INTRAVENOUS
  Filled 2017-12-06: qty 5

## 2017-12-08 ENCOUNTER — Other Ambulatory Visit: Payer: Medicare Other

## 2017-12-11 ENCOUNTER — Inpatient Hospital Stay: Payer: Medicare Other

## 2017-12-11 VITALS — BP 101/75 | HR 98 | Temp 97.9°F | Resp 16

## 2017-12-11 DIAGNOSIS — C9002 Multiple myeloma in relapse: Secondary | ICD-10-CM | POA: Diagnosis not present

## 2017-12-11 DIAGNOSIS — C9000 Multiple myeloma not having achieved remission: Secondary | ICD-10-CM

## 2017-12-11 LAB — CBC WITH DIFFERENTIAL (CANCER CENTER ONLY)
BASOS ABS: 0 10*3/uL (ref 0.0–0.1)
Basophils Relative: 0 %
EOS ABS: 0 10*3/uL (ref 0.0–0.5)
EOS PCT: 1 %
HCT: 24.2 % — ABNORMAL LOW (ref 34.8–46.6)
Hemoglobin: 7.6 g/dL — ABNORMAL LOW (ref 11.6–15.9)
Lymphocytes Relative: 10 %
Lymphs Abs: 0.6 10*3/uL — ABNORMAL LOW (ref 0.9–3.3)
MCH: 28.9 pg (ref 26.0–34.0)
MCHC: 31.4 g/dL — ABNORMAL LOW (ref 32.0–36.0)
MCV: 92 fL (ref 81.0–101.0)
MONO ABS: 1.3 10*3/uL — AB (ref 0.1–0.9)
Monocytes Relative: 24 %
Neutro Abs: 3.7 10*3/uL (ref 1.5–6.5)
Neutrophils Relative %: 65 %
PLATELETS: 54 10*3/uL — AB (ref 145–400)
RBC: 2.63 MIL/uL — AB (ref 3.70–5.32)
RDW: 16.9 % — AB (ref 11.1–15.7)
WBC Count: 5.6 10*3/uL (ref 3.9–10.0)

## 2017-12-11 LAB — CMP (CANCER CENTER ONLY)
ALT: 34 U/L (ref 10–47)
AST: 29 U/L (ref 11–38)
Albumin: 3 g/dL — ABNORMAL LOW (ref 3.5–5.0)
Alkaline Phosphatase: 58 U/L (ref 26–84)
Anion gap: 6 (ref 5–15)
BUN: 20 mg/dL (ref 7–22)
CALCIUM: 9.1 mg/dL (ref 8.0–10.3)
CO2: 22 mmol/L (ref 18–33)
Chloride: 114 mmol/L — ABNORMAL HIGH (ref 98–108)
Creatinine: 1.6 mg/dL — ABNORMAL HIGH (ref 0.60–1.20)
GLUCOSE: 117 mg/dL (ref 73–118)
Potassium: 4.5 mmol/L (ref 3.3–4.7)
SODIUM: 142 mmol/L (ref 128–145)
TOTAL PROTEIN: 5.6 g/dL — AB (ref 6.4–8.1)
Total Bilirubin: 0.5 mg/dL (ref 0.2–1.6)

## 2017-12-11 LAB — SAMPLE TO BLOOD BANK

## 2017-12-11 MED ORDER — SODIUM CHLORIDE 0.9% FLUSH
10.0000 mL | INTRAVENOUS | Status: DC | PRN
  Administered 2017-12-11: 10 mL via INTRAVENOUS
  Filled 2017-12-11: qty 10

## 2017-12-11 MED ORDER — HEPARIN SOD (PORK) LOCK FLUSH 100 UNIT/ML IV SOLN
500.0000 [IU] | Freq: Once | INTRAVENOUS | Status: AC
  Administered 2017-12-11: 500 [IU] via INTRAVENOUS
  Filled 2017-12-11: qty 5

## 2017-12-11 NOTE — Patient Instructions (Signed)
Implanted Port Home Guide An implanted port is a type of central line that is placed under the skin. Central lines are used to provide IV access when treatment or nutrition needs to be given through a person's veins. Implanted ports are used for long-term IV access. An implanted port may be placed because:  You need IV medicine that would be irritating to the small veins in your hands or arms.  You need long-term IV medicines, such as antibiotics.  You need IV nutrition for a long period.  You need frequent blood draws for lab tests.  You need dialysis.  Implanted ports are usually placed in the chest area, but they can also be placed in the upper arm, the abdomen, or the leg. An implanted port has two main parts:  Reservoir. The reservoir is round and will appear as a small, raised area under your skin. The reservoir is the part where a needle is inserted to give medicines or draw blood.  Catheter. The catheter is a thin, flexible tube that extends from the reservoir. The catheter is placed into a large vein. Medicine that is inserted into the reservoir goes into the catheter and then into the vein.  How will I care for my incision site? Do not get the incision site wet. Bathe or shower as directed by your health care provider. How is my port accessed? Special steps must be taken to access the port:  Before the port is accessed, a numbing cream can be placed on the skin. This helps numb the skin over the port site.  Your health care provider uses a sterile technique to access the port. ? Your health care provider must put on a mask and sterile gloves. ? The skin over your port is cleaned carefully with an antiseptic and allowed to dry. ? The port is gently pinched between sterile gloves, and a needle is inserted into the port.  Only "non-coring" port needles should be used to access the port. Once the port is accessed, a blood return should be checked. This helps ensure that the port  is in the vein and is not clogged.  If your port needs to remain accessed for a constant infusion, a clear (transparent) bandage will be placed over the needle site. The bandage and needle will need to be changed every week, or as directed by your health care provider.  Keep the bandage covering the needle clean and dry. Do not get it wet. Follow your health care provider's instructions on how to take a shower or bath while the port is accessed.  If your port does not need to stay accessed, no bandage is needed over the port.  What is flushing? Flushing helps keep the port from getting clogged. Follow your health care provider's instructions on how and when to flush the port. Ports are usually flushed with saline solution or a medicine called heparin. The need for flushing will depend on how the port is used.  If the port is used for intermittent medicines or blood draws, the port will need to be flushed: ? After medicines have been given. ? After blood has been drawn. ? As part of routine maintenance.  If a constant infusion is running, the port may not need to be flushed.  How long will my port stay implanted? The port can stay in for as long as your health care provider thinks it is needed. When it is time for the port to come out, surgery will be   done to remove it. The procedure is similar to the one performed when the port was put in. When should I seek immediate medical care? When you have an implanted port, you should seek immediate medical care if:  You notice a bad smell coming from the incision site.  You have swelling, redness, or drainage at the incision site.  You have more swelling or pain at the port site or the surrounding area.  You have a fever that is not controlled with medicine.  This information is not intended to replace advice given to you by your health care provider. Make sure you discuss any questions you have with your health care provider. Document  Released: 03/14/2005 Document Revised: 08/20/2015 Document Reviewed: 11/19/2012 Elsevier Interactive Patient Education  2017 Elsevier Inc.  

## 2017-12-13 ENCOUNTER — Encounter: Payer: Self-pay | Admitting: Hematology & Oncology

## 2017-12-13 ENCOUNTER — Other Ambulatory Visit: Payer: Medicare Other

## 2017-12-15 ENCOUNTER — Other Ambulatory Visit: Payer: Medicare Other

## 2017-12-18 ENCOUNTER — Telehealth: Payer: Self-pay | Admitting: *Deleted

## 2017-12-18 ENCOUNTER — Inpatient Hospital Stay: Payer: Medicare Other

## 2017-12-18 ENCOUNTER — Inpatient Hospital Stay (HOSPITAL_BASED_OUTPATIENT_CLINIC_OR_DEPARTMENT_OTHER): Payer: Medicare Other | Admitting: Hematology & Oncology

## 2017-12-18 ENCOUNTER — Other Ambulatory Visit: Payer: Self-pay | Admitting: Hematology & Oncology

## 2017-12-18 VITALS — BP 93/56 | HR 104 | Temp 98.2°F | Resp 16 | Wt 95.8 lb

## 2017-12-18 DIAGNOSIS — Z95828 Presence of other vascular implants and grafts: Secondary | ICD-10-CM

## 2017-12-18 DIAGNOSIS — C9000 Multiple myeloma not having achieved remission: Secondary | ICD-10-CM | POA: Diagnosis not present

## 2017-12-18 DIAGNOSIS — C9002 Multiple myeloma in relapse: Secondary | ICD-10-CM | POA: Diagnosis not present

## 2017-12-18 LAB — CMP (CANCER CENTER ONLY)
ALK PHOS: 55 U/L (ref 26–84)
ALT: 48 U/L — AB (ref 10–47)
AST: 49 U/L — AB (ref 11–38)
Albumin: 2.8 g/dL — ABNORMAL LOW (ref 3.5–5.0)
Anion gap: 8 (ref 5–15)
BUN: 51 mg/dL — AB (ref 7–22)
CALCIUM: 9.1 mg/dL (ref 8.0–10.3)
CO2: 18 mmol/L (ref 18–33)
Chloride: 112 mmol/L — ABNORMAL HIGH (ref 98–108)
Creatinine: 3.9 mg/dL (ref 0.60–1.20)
GLUCOSE: 119 mg/dL — AB (ref 73–118)
Potassium: 4.1 mmol/L (ref 3.3–4.7)
SODIUM: 138 mmol/L (ref 128–145)
Total Bilirubin: 0.6 mg/dL (ref 0.2–1.6)
Total Protein: 5.4 g/dL — ABNORMAL LOW (ref 6.4–8.1)

## 2017-12-18 LAB — CBC WITH DIFFERENTIAL (CANCER CENTER ONLY)
BASOS ABS: 0 10*3/uL (ref 0.0–0.1)
Basophils Relative: 0 %
Eosinophils Absolute: 0 10*3/uL (ref 0.0–0.5)
Eosinophils Relative: 1 %
HCT: 18.2 % — ABNORMAL LOW (ref 34.8–46.6)
HEMOGLOBIN: 6.1 g/dL — AB (ref 11.6–15.9)
LYMPHS ABS: 0.4 10*3/uL — AB (ref 0.9–3.3)
LYMPHS PCT: 9 %
MCH: 29.9 pg (ref 26.0–34.0)
MCHC: 33.5 g/dL (ref 32.0–36.0)
MCV: 89.2 fL (ref 81.0–101.0)
Monocytes Absolute: 1.3 10*3/uL — ABNORMAL HIGH (ref 0.1–0.9)
Monocytes Relative: 27 %
NEUTROS PCT: 63 %
Neutro Abs: 3 10*3/uL (ref 1.5–6.5)
PLATELETS: 40 10*3/uL — AB (ref 145–400)
RBC: 2.04 MIL/uL — AB (ref 3.70–5.32)
RDW: 17.9 % — ABNORMAL HIGH (ref 11.1–15.7)
WBC: 4.8 10*3/uL (ref 3.9–10.0)

## 2017-12-18 MED ORDER — HEPARIN SOD (PORK) LOCK FLUSH 100 UNIT/ML IV SOLN
500.0000 [IU] | Freq: Once | INTRAVENOUS | Status: AC
  Administered 2017-12-18: 500 [IU] via INTRAVENOUS
  Filled 2017-12-18: qty 5

## 2017-12-18 MED ORDER — SODIUM CHLORIDE 0.9% FLUSH
10.0000 mL | INTRAVENOUS | Status: DC | PRN
  Administered 2017-12-18: 10 mL via INTRAVENOUS
  Filled 2017-12-18: qty 10

## 2017-12-18 NOTE — Patient Instructions (Signed)
Implanted Port Insertion, Care After °This sheet gives you information about how to care for yourself after your procedure. Your health care provider may also give you more specific instructions. If you have problems or questions, contact your health care provider. °What can I expect after the procedure? °After your procedure, it is common to have: °· Discomfort at the port insertion site. °· Bruising on the skin over the port. This should improve over 3-4 days. ° °Follow these instructions at home: °Port care °· After your port is placed, you will get a manufacturer's information card. The card has information about your port. Keep this card with you at all times. °· Take care of the port as told by your health care provider. Ask your health care provider if you or a family member can get training for taking care of the port at home. A home health care nurse may also take care of the port. °· Make sure to remember what type of port you have. °Incision care °· Follow instructions from your health care provider about how to take care of your port insertion site. Make sure you: °? Wash your hands with soap and water before you change your bandage (dressing). If soap and water are not available, use hand sanitizer. °? Change your dressing as told by your health care provider. °? Leave stitches (sutures), skin glue, or adhesive strips in place. These skin closures may need to stay in place for 2 weeks or longer. If adhesive strip edges start to loosen and curl up, you may trim the loose edges. Do not remove adhesive strips completely unless your health care provider tells you to do that. °· Check your port insertion site every day for signs of infection. Check for: °? More redness, swelling, or pain. °? More fluid or blood. °? Warmth. °? Pus or a bad smell. °General instructions °· Do not take baths, swim, or use a hot tub until your health care provider approves. °· Do not lift anything that is heavier than 10 lb (4.5  kg) for a week, or as told by your health care provider. °· Ask your health care provider when it is okay to: °? Return to work or school. °? Resume usual physical activities or sports. °· Do not drive for 24 hours if you were given a medicine to help you relax (sedative). °· Take over-the-counter and prescription medicines only as told by your health care provider. °· Wear a medical alert bracelet in case of an emergency. This will tell any health care providers that you have a port. °· Keep all follow-up visits as told by your health care provider. This is important. °Contact a health care provider if: °· You cannot flush your port with saline as directed, or you cannot draw blood from the port. °· You have a fever or chills. °· You have more redness, swelling, or pain around your port insertion site. °· You have more fluid or blood coming from your port insertion site. °· Your port insertion site feels warm to the touch. °· You have pus or a bad smell coming from the port insertion site. °Get help right away if: °· You have chest pain or shortness of breath. °· You have bleeding from your port that you cannot control. °Summary °· Take care of the port as told by your health care provider. °· Change your dressing as told by your health care provider. °· Keep all follow-up visits as told by your health care provider. °  This information is not intended to replace advice given to you by your health care provider. Make sure you discuss any questions you have with your health care provider. °Document Released: 01/02/2013 Document Revised: 02/03/2016 Document Reviewed: 02/03/2016 °Elsevier Interactive Patient Education © 2017 [employer] ° °

## 2017-12-18 NOTE — Telephone Encounter (Signed)
Critical Value Hgb 6.1 Creatinine 3.9 Dr Marin Olp notified. No orders at this time.

## 2017-12-18 NOTE — Progress Notes (Signed)
I put the admission orders in and signed the chemo orders on 09/23 at 1:55PM  [facility]

## 2017-12-18 NOTE — Telephone Encounter (Signed)
Requested inpatient bed on 6th floor at [facility] for admission this Wednesday for VDPACE. They will call patient to come in when bed available.   Message also sent to inpatient chemo team via email.

## 2017-12-18 NOTE — Progress Notes (Signed)
Hematology and Oncology Follow Up Visit  Jeanne Martinez [id number] 1950-06-14 67 y.o. 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  She and her husband actually were up in [place] recently.  She is very happy to have gone up to [facility].  This is where they actually live.  She enjoyed herself.  I still have not heard back from [place] about her being accepted into their CAR-T program.  This is what I have been waiting for.  I am somewhat disappointed that I am not heard back from [place].  I think that her disease is already relapsing.  Her creatinine is up to 3.9.  This is a sure sign of her myeloma worsening.  I am going to have to get her back into the hospital for another round of VD-PACE.  She responded incredibly well to this and had very little in the way of toxicity.  Her last kappa light chain was 31.8 mg/dL.  This was improving.  Somehow, I suspect that today's will be a lot higher.  She is eating.  She is having no nausea or vomiting.  She is having no cough.  She is having no rashes.  There is been no diarrhea.  She has had no bleeding.  She still has a bad short-term memory issues.  Thankfully, her husband helps out a lot..  Overall, her performance status is ECOG 1.  Medications:  Allergies as of 12/18/2017   No Known Allergies     Medication List        Accurate as of 12/18/17 11:16 AM. Always use your  most recent med list.          antiseptic oral rinse Liqd 15 mLs by Mouth Rinse route every 4 (four) hours.   famciclovir 250 MG tablet Commonly known as:  FAMVIR TAKE 1 TABLET BY MOUTH EVERY DAY   lidocaine-prilocaine cream Commonly known as:  EMLA Apply 1 application topically as needed. Apply to port one hour before appointment   LORazepam 0.5 MG tablet Commonly known as:  ATIVAN Take 1 tablet (0.5 mg total) by mouth every 6 (six) hours as needed (Nausea or vomiting).   ondansetron 8 MG tablet Commonly known as:  ZOFRAN Take 1 tablet (8 mg total) by mouth 2 (two) times daily as needed for refractory nausea / vomiting. Start on the second day after inpatient chemo completed.   potassium chloride SA 20 MEQ tablet Commonly known as:  K-DUR,KLOR-CON Take 1 tablet (20 mEq total) by mouth daily.   prochlorperazine 10 MG tablet Commonly known as:  COMPAZINE Take 1 tablet (10 mg total) by mouth every 6 (six) hours as needed (Nausea or vomiting).       Allergies:  No Known Allergies  Past Medical History, Surgical history, Social history, and Family History were reviewed and updated.  Review of Systems: Review of Systems  Constitutional: Positive for weight loss.  HENT: Negative.   Eyes: Negative.   Respiratory: Negative.  Cardiovascular: Negative.   Gastrointestinal: Negative.   Genitourinary: Negative.   Musculoskeletal: Negative.   Skin: Negative.   Neurological: Negative.   Endo/Heme/Allergies: Negative.   Psychiatric/Behavioral: Negative.    Marland Kitchen   Physical Exam:  weight is 95 lb 12 oz (43.4 kg). Her oral temperature is 98.2 F (36.8 C). Her blood pressure is 93/56 (abnormal) and her pulse is 104 (abnormal). Her respiration is 16 and oxygen saturation is 100%.   Wt Readings from Last 3 Encounters:  12/18/17 95 lb 12 oz (43.4 kg)  11/28/17 93 lb 4 oz (42.3 kg)  11/20/17 94 lb (42.6 kg)    Physical Exam  Constitutional: She is oriented to person, place,  and time.  HENT:  Head: Normocephalic and atraumatic.  Mouth/Throat: Oropharynx is clear and moist.  Eyes: Pupils are equal, round, and reactive to light. EOM are normal.  Neck: Normal range of motion.  Cardiovascular: Normal rate, regular rhythm and normal heart sounds.  Pulmonary/Chest: Effort normal and breath sounds normal.  Abdominal: Soft. Bowel sounds are normal.  Musculoskeletal: Normal range of motion. She exhibits no edema, tenderness or deformity.  Lymphadenopathy:    She has no cervical adenopathy.  Neurological: She is alert and oriented to person, place, and time.  Skin: Skin is warm and dry. No rash noted. No erythema.  Psychiatric: She has a normal mood and affect. Her behavior is normal. Judgment and thought content normal.  Vitals reviewed.    Lab Results  Component Value Date   WBC 4.8 12/18/2017   HGB 6.1 (LL) 12/18/2017   HCT 18.2 (L) 12/18/2017   MCV 89.2 12/18/2017   PLT 40 (L) 12/18/2017   Lab Results  Component Value Date   FERRITIN 1,608 (H) 08/23/2017   IRON 98 08/23/2017   TIBC 210 (L) 08/23/2017   UIBC 112 08/23/2017   IRONPCTSAT 47 08/23/2017   Lab Results  Component Value Date   RBC 2.04 (L) 12/18/2017   Lab Results  Component Value Date   KPAFRELGTCHN 318.2 (H) 11/28/2017   LAMBDASER 7.6 11/28/2017   KAPLAMBRATIO 41.87 (H) 11/28/2017   Lab Results  Component Value Date   IGGSERUM 239 (L) 11/28/2017   IGA 20 (L) 11/28/2017   IGMSERUM 9 (L) 11/28/2017   Lab Results  Component Value Date   TOTALPROTELP 5.0 (L) 11/28/2017   ALBUMINELP 3.1 11/28/2017   A1GS 0.3 11/28/2017   A2GS 0.8 11/28/2017   BETS 0.6 (L) 11/28/2017   GAMS 0.2 (L) 11/28/2017   MSPIKE Not Observed 11/28/2017   SPEI Comment 10/06/2017     Chemistry      Component Value Date/Time   NA 142 12/11/2017 1050   NA 148 (H) 03/17/2017 1210   K 4.5 12/11/2017 1050   K 4.4 03/17/2017 1210   CL 114 (H) 12/11/2017 1050   CL 106 03/17/2017 1210   CO2 22 12/11/2017  1050   CO2 27 03/17/2017 1210   BUN 20 12/11/2017 1050   BUN 23 (H) 03/17/2017 1210   CREATININE 1.60 (H) 12/11/2017 1050   CREATININE 1.1 03/17/2017 1210      Component Value Date/Time   CALCIUM 9.1 12/11/2017 1050   CALCIUM 9.1 03/17/2017 1210   ALKPHOS 58 12/11/2017 1050   ALKPHOS 52 03/17/2017 1210   AST 29 12/11/2017 1050   ALT 34 12/11/2017 1050   ALT 21 03/17/2017 1210   BILITOT 0.5 12/11/2017 1050      Impression and Plan: Jeanne Martinez is a very pleasant 67 yo caucasian  female with IgA kappa myeloma with 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   Her blood counts have come up real nicely.  As such, we have more flexibility with our dosing.  I hopefully will be able to hear back from [facility] regarding any protocol that she will be accepted into.  In the meantime, we will have her admitted tomorrow.  We will have to get started on treatment for her.  Hopefully, we will still see that she is receptive to treatment in the light chains improve in her renal function improves.    Volanda Napoleon, MD 9/23/201911:16 AM

## 2017-12-19 ENCOUNTER — Encounter (HOSPITAL_COMMUNITY): Payer: Self-pay

## 2017-12-19 ENCOUNTER — Inpatient Hospital Stay (HOSPITAL_COMMUNITY): Payer: Medicare Other

## 2017-12-19 ENCOUNTER — Inpatient Hospital Stay (HOSPITAL_COMMUNITY)
Admission: RE | Admit: 2017-12-19 | Discharge: 2017-12-24 | DRG: 847 | Disposition: A | Discharge status: Home / Self Care | Payer: Medicare Other | Source: Ambulatory Visit | Attending: Hematology & Oncology | Admitting: Hematology & Oncology

## 2017-12-19 ENCOUNTER — Other Ambulatory Visit: Payer: Self-pay

## 2017-12-19 DIAGNOSIS — C9000 Multiple myeloma not having achieved remission: Secondary | ICD-10-CM | POA: Diagnosis present

## 2017-12-19 DIAGNOSIS — R413 Other amnesia: Secondary | ICD-10-CM | POA: Diagnosis present

## 2017-12-19 DIAGNOSIS — N183 Chronic kidney disease, stage 3 (moderate): Secondary | ICD-10-CM | POA: Diagnosis present

## 2017-12-19 DIAGNOSIS — Z5111 Encounter for antineoplastic chemotherapy: Secondary | ICD-10-CM | POA: Diagnosis present

## 2017-12-19 DIAGNOSIS — Z79899 Other long term (current) drug therapy: Secondary | ICD-10-CM

## 2017-12-19 DIAGNOSIS — I129 Hypertensive chronic kidney disease with stage 1 through stage 4 chronic kidney disease, or unspecified chronic kidney disease: Secondary | ICD-10-CM | POA: Diagnosis present

## 2017-12-19 DIAGNOSIS — N2889 Other specified disorders of kidney and ureter: Secondary | ICD-10-CM | POA: Diagnosis not present

## 2017-12-19 DIAGNOSIS — D8989 Other specified disorders involving the immune mechanism, not elsewhere classified: Secondary | ICD-10-CM

## 2017-12-19 DIAGNOSIS — D61818 Other pancytopenia: Secondary | ICD-10-CM | POA: Diagnosis present

## 2017-12-19 LAB — CBC
HEMATOCRIT: 17.4 % — AB (ref 36.0–46.0)
Hemoglobin: 6 g/dL — CL (ref 12.0–15.0)
MCH: 29.7 pg (ref 26.0–34.0)
MCHC: 34.5 g/dL (ref 30.0–36.0)
MCV: 86.1 fL (ref 78.0–100.0)
PLATELETS: 39 10*3/uL — AB (ref 150–400)
RBC: 2.02 MIL/uL — AB (ref 3.87–5.11)
RDW: 18.7 % — ABNORMAL HIGH (ref 11.5–15.5)
WBC: 4.6 10*3/uL (ref 4.0–10.5)

## 2017-12-19 LAB — URINALYSIS, COMPLETE (UACMP) WITH MICROSCOPIC
BACTERIA UA: NONE SEEN
Bilirubin Urine: NEGATIVE
GLUCOSE, UA: 50 mg/dL — AB
KETONES UR: NEGATIVE mg/dL
Leukocytes, UA: NEGATIVE
Nitrite: NEGATIVE
PROTEIN: 30 mg/dL — AB
Specific Gravity, Urine: 1.01 (ref 1.005–1.030)
pH: 6 (ref 5.0–8.0)

## 2017-12-19 LAB — COMPREHENSIVE METABOLIC PANEL
ALBUMIN: 2.9 g/dL — AB (ref 3.5–5.0)
ALT: 46 U/L — AB (ref 0–44)
AST: 42 U/L — AB (ref 15–41)
Alkaline Phosphatase: 45 U/L (ref 38–126)
Anion gap: 11 (ref 5–15)
BUN: 59 mg/dL — AB (ref 8–23)
CHLORIDE: 110 mmol/L (ref 98–111)
CO2: 18 mmol/L — ABNORMAL LOW (ref 22–32)
CREATININE: 4.1 mg/dL — AB (ref 0.44–1.00)
Calcium: 8.7 mg/dL — ABNORMAL LOW (ref 8.9–10.3)
GFR calc Af Amer: 12 mL/min — ABNORMAL LOW (ref >60)
GFR, EST NON AFRICAN AMERICAN: 10 mL/min — AB (ref >60)
GLUCOSE: 122 mg/dL — AB (ref 70–99)
Potassium: 4 mmol/L (ref 3.5–5.1)
Sodium: 139 mmol/L (ref 135–145)
Total Bilirubin: 0.6 mg/dL (ref 0.3–1.2)
Total Protein: 5.4 g/dL — ABNORMAL LOW (ref 6.5–8.1)

## 2017-12-19 LAB — KAPPA/LAMBDA LIGHT CHAINS
KAPPA FREE LGHT CHN: 11668.6 mg/L — AB (ref 3.3–19.4)
KAPPA, LAMDA LIGHT CHAIN RATIO: UNDETERMINED

## 2017-12-19 LAB — IGG, IGA, IGM
IgA: <5 mg/dL — ABNORMAL LOW (ref 87–352)
IgG (Immunoglobin G), Serum: 177 mg/dL — ABNORMAL LOW (ref 700–1600)

## 2017-12-19 LAB — MAGNESIUM: MAGNESIUM: 1.2 mg/dL — AB (ref 1.7–2.4)

## 2017-12-19 MED ORDER — POTASSIUM CL IN DEXTROSE 5% 20 MEQ/L IV SOLN
20.0000 meq | INTRAVENOUS | Status: DC
  Administered 2017-12-19 – 2017-12-24 (×6): 20 meq via INTRAVENOUS
  Filled 2017-12-19 (×6): qty 1000

## 2017-12-19 MED ORDER — SODIUM CHLORIDE 0.9 % IV SOLN
Freq: Once | INTRAVENOUS | Status: AC
  Administered 2017-12-19: 17:00:00 via INTRAVENOUS
  Filled 2017-12-19: qty 5

## 2017-12-19 MED ORDER — POTASSIUM CHLORIDE 2 MEQ/ML IV SOLN
Freq: Once | INTRAVENOUS | Status: AC
  Administered 2017-12-19: 17:00:00 via INTRAVENOUS
  Filled 2017-12-19: qty 10

## 2017-12-19 MED ORDER — BORTEZOMIB CHEMO IV INJECTION 3.5 MG(1MG/ML)
1.3000 mg/m2 | Freq: Once | INTRAMUSCULAR | Status: AC
  Administered 2017-12-19: 1.8 mg via INTRAVENOUS
  Filled 2017-12-19: qty 1.8

## 2017-12-19 MED ORDER — DEXAMETHASONE 4 MG PO TABS
40.0000 mg | ORAL_TABLET | Freq: Once | ORAL | Status: AC
  Administered 2017-12-19: 40 mg via ORAL
  Filled 2017-12-19: qty 10

## 2017-12-19 MED ORDER — PROCHLORPERAZINE MALEATE 10 MG PO TABS: 10.0000 mg | ORAL_TABLET | Freq: Four times a day (QID) | ORAL | Status: DC | PRN

## 2017-12-19 MED ORDER — FAMCICLOVIR 500 MG PO TABS
250.0000 mg | ORAL_TABLET | Freq: Every day | ORAL | Status: DC
  Administered 2017-12-20 – 2017-12-24 (×5): 250 mg via ORAL
  Filled 2017-12-19 (×5): qty 0.5

## 2017-12-19 MED ORDER — SODIUM CHLORIDE 0.9 % IV SOLN
Freq: Once | INTRAVENOUS | Status: AC
  Administered 2017-12-19: 7 mg via INTRAVENOUS
  Filled 2017-12-19: qty 7

## 2017-12-19 MED ORDER — LORAZEPAM 0.5 MG PO TABS: 0.5000 mg | ORAL_TABLET | Freq: Four times a day (QID) | ORAL | Status: DC | PRN

## 2017-12-19 MED ORDER — ONDANSETRON HCL 8 MG PO TABS: 8.0000 mg | ORAL_TABLET | Freq: Two times a day (BID) | ORAL | Status: DC | PRN

## 2017-12-19 MED ORDER — PALONOSETRON HCL INJECTION 0.25 MG/5ML
0.2500 mg | Freq: Once | INTRAVENOUS | Status: AC
  Administered 2017-12-19: 0.25 mg via INTRAVENOUS
  Filled 2017-12-19: qty 5

## 2017-12-19 MED ORDER — SODIUM CHLORIDE 0.9 % IV SOLN
INTRAVENOUS | Status: DC
  Administered 2017-12-19 – 2017-12-24 (×3): via INTRAVENOUS

## 2017-12-19 MED ORDER — SODIUM CHLORIDE 0.9 % IV SOLN
7.5000 mg/m2 | Freq: Once | INTRAVENOUS | Status: AC
  Administered 2017-12-19: 10 mg via INTRAVENOUS
  Filled 2017-12-19: qty 5

## 2017-12-19 MED ORDER — BIOTENE DRY MOUTH MT LIQD
15.0000 mL | OROMUCOSAL | Status: DC
  Administered 2017-12-19 – 2017-12-24 (×20): 15 mL via OROMUCOSAL

## 2017-12-19 NOTE — Progress Notes (Signed)
CRITICAL VALUE ALERT  Critical Value:  hgb 6.0  Date & Time Notied: 12/19/1898  1515  Provider Notified:Dr ENNEVER PAGED  Orders Received/Actions taken: No verbal orders received

## 2017-12-19 NOTE — Progress Notes (Signed)
Spoke with Dr. Marin Olp - He is aware of 9/23 lab results =  Hg:6.1, Scr: 3.9, Pltc 40. Inpatient RN is redrawing labs today. MD would like to treat regardless of lab results on 9/23 and 9/24. Cytopenias and increasing renal function are due to her disease. He would like chemotherapy to start today regardless of 9/24 lab results. Will transfuse patient as needed. No changes in chemo doses. MD would like the same chemo doses as in Cycle 2 despite her increasing/changing Scr.  Hardie Pulley, PharmD, BCPS, [place] Phone: [phone]

## 2017-12-19 NOTE — H&P (Signed)
Referral MD  Reason for Referral: Kappa light chain myeloma-progressive  No chief complaint on file. : I am in for my next cycle of chemotherapy.  HPI: Jeanne Martinez is a very nice 67 year old white female.  She has refractory kappa light chain myeloma.  She has had 2 cycles of VD-PACE.  She has done incredibly well.  Her light chains went down from 21,000 down to little over 300 mg.  I am try to get her on protocol forCAR-T therapy in [place].  I have not been able to get her on protocol yet.  I thought that we can get her on by the end of September.  However, I have not been notified yet of her approval.  She now is showing that her disease is progressing again.  This is always manifested by her BUN and creatinine.  A couple weeks ago, her creatinine was 1.5.  Yesterday, it was 3.9.  In addition, her heavy chains have been declining.  Since the VD-PACE works as well and she had absolutely no toxicity with it, I thought that it would be worthwhile to try to do it again.  She is eating better.  She actually was up in [place] over the weekend with her husband.  They actually live in [place].  It has been a while since she is going up there.  She has bad short-term memory issues.  This is chronic.  She is had no diarrhea.  She has had no pain.  She is had no cough or shortness of breath.  Overall, her performance status is ECOG 1.    Past Medical History:  Diagnosis Date  . Anemia of chronic renal failure, stage 3 (moderate) ([place]) 08/08/2017  . Cancer ([place])    multiple myeloma  . Counseling regarding goals of care 08/08/2017  . Hypertension    patient denies  . Kappa light chain deposition disease ([place]) 08/08/2017  . Renal disorder    acute renal failure due to myeloma  :  Past Surgical History:  Procedure Laterality Date  . IR IMAGING GUIDED PORT INSERTION  09/19/2017  . NO PAST SURGERIES    :   Current Facility-Administered Medications:  .  antiseptic oral rinse  (BIOTENE) solution 15 mL, 15 mL, Mouth Rinse, Q4H, Lezlie Ritchey, Rudell Cobb, MD .  famciclovir [facility]) tablet 250 mg, 250 mg, Oral, Daily, Draycen Leichter, Rudell Cobb, MD .  LORazepam (ATIVAN) tablet 0.5 mg, 0.5 mg, Oral, Q6H PRN, Volanda Napoleon, MD .  ondansetron (ZOFRAN) tablet 8 mg, 8 mg, Oral, BID PRN, Volanda Napoleon, MD .  prochlorperazine (COMPAZINE) tablet 10 mg, 10 mg, Oral, Q6H PRN, Volanda Napoleon, MD  Facility-Administered Medications Ordered in Other Encounters:  .  sodium chloride flush (NS) 0.9 % injection 10 mL, 10 mL, Intravenous, PRN, [place], Sarah M, NP, 10 mL at 12/04/17 1201:  . antiseptic oral rinse  15 mL Mouth Rinse Q4H  . famciclovir  250 mg Oral Daily  :  No Known Allergies:  Family History  Problem Relation Age of Onset  . Multiple myeloma Mother   . Cancer Father   :  Social History   Socioeconomic History  . Marital status: Married    Spouse name: Not on file  . Number of children: Not on file  . Years of education: Not on file  . Highest education level: Not on file  Occupational History  . Occupation: retired    Comment: Designer, television/film set  Social Needs  . Emergency planning/management officer  strain: Not on file  . Food insecurity:    Worry: Not on file    Inability: Not on file  . Transportation needs:    Medical: Not on file    Non-medical: Not on file  Tobacco Use  . Smoking status: Never Smoker  . Smokeless tobacco: Never Used  Substance and Sexual Activity  . Alcohol use: No  . Drug use: No  . Sexual activity: Not on file  Lifestyle  . Physical activity:    Days per week: Not on file    Minutes per session: Not on file  . Stress: Not on file  Relationships  . Social connections:    Talks on phone: Not on file    Gets together: Not on file    Attends religious service: Not on file    Active member of club or organization: Not on file    Attends meetings of clubs or organizations: Not on file    Relationship status: Not on file  . Intimate partner  violence:    Fear of current or ex partner: Not on file    Emotionally abused: Not on file    Physically abused: Not on file    Forced sexual activity: Not on file  Other Topics Concern  . Not on file  Social History Narrative  . Not on file  :    Exam: Thin white female in no obvious distress.  Vital signs show a temperature of 97.5.  Pulse 101.  Blood pressure 103/60.  Head neck exam shows no ocular or oral lesions.  She has no palpable cervical or supra clavicular lymph nodes.  Lungs are clear bilaterally.  Cardiac exam regular rate and rhythm with no murmurs, rubs or bruits.  Abdomen is soft.  She has good bowel sounds.  There is no fluid wave.  There is no palpable liver or spleen tip.  Back exam shows no tenderness over the spine, ribs or hips.  Extremities does show some chronic pitting edema in her lower legs.  Neurological exam shows no focal neurological deficits.  Skin exam shows no rashes, ecchymoses or petechia. Patient Vitals for the past 24 hrs:  BP Temp Temp src Pulse Resp SpO2  12/19/17 1151 103/60 (!) 97.5 F (36.4 C) Oral (!) 101 18 100 %     Recent Labs    12/18/17 1008  WBC 4.8  HGB 6.1*  HCT 18.2*  PLT 40*   Recent Labs    12/18/17 1008  NA 138  K 4.1  CL 112*  CO2 18  GLUCOSE 119*  BUN 51*  CREATININE 3.90*  CALCIUM 9.1    Blood smear review: None  Pathology: None    Assessment and Plan: [name] is a 67 year old white female with refractory kappa light chain myeloma.  She actually has responded well to VD-PACE.  However, we have tried to get her onto CAR-T therapy.  I am not sure if this will ever happen.  We will admit her.  She has blood counts that are marginal but for her, are pretty good.  She does get transfusion support.  We will go ahead and transfuse her if necessary.  Hopefully, we will start her chemotherapy today.  She does get dose reduced amounts of treatment.  We will have to watch her fluid intake.  She can get  fluid overloaded quite quickly.  I will make sure she gets a chest x-ray.  Lattie Haw, MD

## 2017-12-19 NOTE — Progress Notes (Signed)
Chemo orders and does verified with Aurelio Jew .

## 2017-12-20 ENCOUNTER — Other Ambulatory Visit: Payer: Medicare Other

## 2017-12-20 LAB — PROTEIN ELECTROPHORESIS, SERUM, WITH REFLEX
A/G Ratio: 1.4 (ref 0.7–1.7)
ALPHA-1-GLOBULIN: 0.3 g/dL (ref 0.0–0.4)
Albumin ELP: 2.9 g/dL (ref 2.9–4.4)
Alpha-2-Globulin: 0.8 g/dL (ref 0.4–1.0)
Beta Globulin: 0.7 g/dL (ref 0.7–1.3)
GAMMA GLOBULIN: 0.3 g/dL — AB (ref 0.4–1.8)
Globulin, Total: 2.1 g/dL — ABNORMAL LOW (ref 2.2–3.9)
M-SPIKE, %: 0.2 g/dL — AB
SPEP Interpretation: 0
TOTAL PROTEIN ELP: 5 g/dL — AB (ref 6.0–8.5)

## 2017-12-20 LAB — IMMUNOFIXATION REFLEX, SERUM: IGG (IMMUNOGLOBIN G), SERUM: 168 mg/dL — AB (ref 700–1600)

## 2017-12-20 MED ORDER — SODIUM CHLORIDE 0.9 % IV SOLN
Freq: Once | INTRAVENOUS | Status: AC
  Administered 2017-12-20: 595 mg via INTRAVENOUS
  Filled 2017-12-20: qty 7

## 2017-12-20 MED ORDER — POTASSIUM CHLORIDE 2 MEQ/ML IV SOLN
Freq: Once | INTRAVENOUS | Status: AC
  Administered 2017-12-20: 18:00:00 via INTRAVENOUS
  Filled 2017-12-20: qty 10

## 2017-12-20 MED ORDER — SODIUM BICARBONATE/SODIUM CHLORIDE MOUTHWASH
OROMUCOSAL | Status: DC
  Administered 2017-12-20 – 2017-12-24 (×20): via OROMUCOSAL
  Filled 2017-12-20: qty 1000

## 2017-12-20 MED ORDER — DEXAMETHASONE 4 MG PO TABS
40.0000 mg | ORAL_TABLET | Freq: Once | ORAL | Status: AC
  Administered 2017-12-20: 40 mg via ORAL
  Filled 2017-12-20: qty 10

## 2017-12-20 MED ORDER — SODIUM CHLORIDE 0.9 % IV SOLN
7.5000 mg/m2 | Freq: Once | INTRAVENOUS | Status: AC
  Administered 2017-12-20: 10 mg via INTRAVENOUS
  Filled 2017-12-20: qty 5

## 2017-12-20 NOTE — Progress Notes (Signed)
Jeanne Martinez start her chemotherapy yesterday.  Unfortunately, it was obvious that her light chain disease has worsened.  Her Kappa light chain went from 370 to over 11,000.  This clearly explains why her renal function worsened.  She has tolerated chemotherapy well so far.  She is urinating.  She is having no mouth sores.  She is having no nausea or vomiting.  Her labs when she came in showed a creatinine of 4.10.  Her magnesium was 1.2.  Hemoglobin was 6.  Her labs today are not yet back.  She is eating without nausea.  There is no cough or shortness of breath.  Her chest x-ray looks all right.  Her intake and output are still relatively even.  Her vital signs are all stable.  Her blood pressure is 94/60.  Her lungs are clear.  Oral exam shows no mucositis.  Cardiac exam regular rate and rhythm.  Abdomen is soft.  She has good bowel sounds.  There is no fluid wave.  Extremities shows no clubbing, cyanosis or edema.  Skin exam shows no rashes.  Neurological exam still shows the short-term memory issues.  Jeanne Martinez has refractory kappa light chain myeloma.  She is on her third cycle of VD-PACE.  Hopefully, we will see a response.  I am still trying to get a hold of the investigator down at the [facility] cancer center who is running the CAR-T trial to see if we can get Jeanne Martinez down there.  I very much appreciate the wonderful care that the staff on [address] is providing Jeanne Martinez.  Lattie Haw, MD  [address] [phone]

## 2017-12-20 NOTE — Progress Notes (Signed)
OK to proceed with chemo today without labs today per Dr Marin Olp.

## 2017-12-21 LAB — COMPREHENSIVE METABOLIC PANEL
ALBUMIN: 2.4 g/dL — AB (ref 3.5–5.0)
ALK PHOS: 37 U/L — AB (ref 38–126)
ALT: 37 U/L (ref 0–44)
ANION GAP: 9 (ref 5–15)
AST: 26 U/L (ref 15–41)
BILIRUBIN TOTAL: 0.3 mg/dL (ref 0.3–1.2)
BUN: 60 mg/dL — ABNORMAL HIGH (ref 8–23)
CALCIUM: 7.8 mg/dL — AB (ref 8.9–10.3)
CO2: 13 mmol/L — ABNORMAL LOW (ref 22–32)
Chloride: 118 mmol/L — ABNORMAL HIGH (ref 98–111)
Creatinine, Ser: 2.67 mg/dL — ABNORMAL HIGH (ref 0.44–1.00)
GFR calc Af Amer: 20 mL/min — ABNORMAL LOW (ref >60)
GFR, EST NON AFRICAN AMERICAN: 17 mL/min — AB (ref >60)
GLUCOSE: 167 mg/dL — AB (ref 70–99)
POTASSIUM: 4.5 mmol/L (ref 3.5–5.1)
Sodium: 140 mmol/L (ref 135–145)
TOTAL PROTEIN: 4.5 g/dL — AB (ref 6.5–8.1)

## 2017-12-21 LAB — CBC WITH DIFFERENTIAL/PLATELET
BASOS PCT: 0 %
Basophils Absolute: 0 10*3/uL (ref 0.0–0.1)
Eosinophils Absolute: 0 10*3/uL (ref 0.0–0.7)
Eosinophils Relative: 0 %
HEMATOCRIT: 17 % — AB (ref 36.0–46.0)
Hemoglobin: 5.8 g/dL — CL (ref 12.0–15.0)
LYMPHS ABS: 1.1 10*3/uL (ref 0.7–4.0)
Lymphocytes Relative: 10 %
MCH: 29.7 pg (ref 26.0–34.0)
MCHC: 34.1 g/dL (ref 30.0–36.0)
MCV: 87.2 fL (ref 78.0–100.0)
MONOS PCT: 12 %
Monocytes Absolute: 1.3 10*3/uL — ABNORMAL HIGH (ref 0.1–1.0)
NEUTROS ABS: 8.1 10*3/uL — AB (ref 1.7–7.7)
Neutrophils Relative %: 76 %
OTHER: 2 %
Platelets: 24 10*3/uL — CL (ref 150–400)
RBC: 1.95 MIL/uL — AB (ref 3.87–5.11)
RDW: 19.6 % — AB (ref 11.5–15.5)
WBC: 10.6 10*3/uL — AB (ref 4.0–10.5)

## 2017-12-21 LAB — PATHOLOGIST SMEAR REVIEW

## 2017-12-21 MED ORDER — FLUCONAZOLE 100 MG PO TABS
100.0000 mg | ORAL_TABLET | Freq: Every day | ORAL | Status: DC
  Administered 2017-12-21 – 2017-12-24 (×4): 100 mg via ORAL
  Filled 2017-12-21 (×4): qty 1

## 2017-12-21 MED ORDER — SODIUM CHLORIDE 0.9 % IV SOLN
7.5000 mg/m2 | Freq: Once | INTRAVENOUS | Status: AC
  Administered 2017-12-21: 10 mg via INTRAVENOUS
  Filled 2017-12-21: qty 5

## 2017-12-21 MED ORDER — PALONOSETRON HCL INJECTION 0.25 MG/5ML
0.2500 mg | Freq: Once | INTRAVENOUS | Status: AC
  Administered 2017-12-21: 0.25 mg via INTRAVENOUS
  Filled 2017-12-21: qty 5

## 2017-12-21 MED ORDER — SODIUM CHLORIDE 0.9 % IV SOLN
Freq: Once | INTRAVENOUS | Status: AC
  Administered 2017-12-21: 595 mg via INTRAVENOUS
  Filled 2017-12-21: qty 7

## 2017-12-21 MED ORDER — POTASSIUM CHLORIDE 2 MEQ/ML IV SOLN
Freq: Once | INTRAVENOUS | Status: AC
  Administered 2017-12-21: 18:00:00 via INTRAVENOUS
  Filled 2017-12-21: qty 10

## 2017-12-21 MED ORDER — DEXAMETHASONE 4 MG PO TABS
40.0000 mg | ORAL_TABLET | Freq: Once | ORAL | Status: AC
  Administered 2017-12-21: 40 mg via ORAL
  Filled 2017-12-21: qty 10

## 2017-12-21 NOTE — Progress Notes (Signed)
CRITICAL VALUE ALERT  Critical Value:  Platelets 24,000  Date & Time Notied:  12/21/2017  Provider Notified: Dr Marin Olp  Orders Received/Actions taken:

## 2017-12-21 NOTE — Progress Notes (Signed)
Spoke with Charlsie Merles, Triage RN at Dr. Antonieta Pert office re AM LABS: 24,000 platelets and 5.8 hemoglobin.  No new orders.

## 2017-12-21 NOTE — Progress Notes (Signed)
Jeanne Martinez is doing okay.  She is tolerating her chemotherapy, as expected.  To date, she really has had very few side effects from chemotherapy.  She is urinating okay.  We still have to watch out with her intake and output.  She seems to be eating okay.  Again, she has very little short-term memory.  There is no on with her at this morning.  There are no labs back yet from today.  She is had no fever.  She is rinsing her mouth out with both Biotene and sodium bicarb.  There is been no bleeding.  She has had no cough or shortness of breath.  She is had no obvious diarrhea.  Her vital signs look good.  Her temperature is 98.1.  Pulse 91.  Blood pressure 102/64.  Her oral exam shows no mucositis.  She has no adenopathy in the neck.  Lungs are clear.  Cardiac exam regular rate and rhythm with no murmurs, rubs or bruits.  Abdomen is soft.  Bowel sounds are present.  There is no fluid wave.  Extremities shows no clubbing, cyanosis.  She has the chronic mild edema in her lower legs and feet.  Neurological exam is non-focal.  Skin exam is without rashes or ecchymoses.  We will continue to proceed with her chemotherapy.  Hopefully, we will start to see her renal function improve.  This is always a guaranteed method to assess for response.  Again, we will have to watch her intake and output.  We will see what her hemoglobin is.  I suspect we probably will have to transfuse her at some point.  I know that the staff up on [address] are doing a great job with her.  As always, their compassion and professionalism are always on display.  Jeanne Haw, MD  Oswaldo Milian [phone]

## 2017-12-21 NOTE — Progress Notes (Signed)
Chemo dosages and dilutions verified by Drue Dun, RN and myself

## 2017-12-22 ENCOUNTER — Other Ambulatory Visit: Payer: Self-pay | Admitting: *Deleted

## 2017-12-22 ENCOUNTER — Inpatient Hospital Stay (HOSPITAL_COMMUNITY): Payer: Medicare Other

## 2017-12-22 DIAGNOSIS — C9000 Multiple myeloma not having achieved remission: Secondary | ICD-10-CM

## 2017-12-22 LAB — CBC WITH DIFFERENTIAL/PLATELET
BASOS PCT: 0 %
Basophils Absolute: 0 10*3/uL (ref 0.0–0.1)
EOS ABS: 0 10*3/uL (ref 0.0–0.7)
EOS PCT: 0 %
HEMATOCRIT: 19.3 % — AB (ref 36.0–46.0)
HEMOGLOBIN: 6.5 g/dL — AB (ref 12.0–15.0)
Lymphocytes Relative: 7 %
Lymphs Abs: 0.9 10*3/uL (ref 0.7–4.0)
MCH: 29.5 pg (ref 26.0–34.0)
MCHC: 33.7 g/dL (ref 30.0–36.0)
MCV: 87.7 fL (ref 78.0–100.0)
Monocytes Absolute: 1.2 10*3/uL — ABNORMAL HIGH (ref 0.1–1.0)
Monocytes Relative: 9 %
NEUTROS ABS: 11 10*3/uL — AB (ref 1.7–7.7)
NEUTROS PCT: 84 %
Platelets: 19 10*3/uL — CL (ref 150–400)
RBC: 2.2 MIL/uL — ABNORMAL LOW (ref 3.87–5.11)
RDW: 19.9 % — ABNORMAL HIGH (ref 11.5–15.5)
WBC: 13.1 10*3/uL — ABNORMAL HIGH (ref 4.0–10.5)

## 2017-12-22 LAB — COMPREHENSIVE METABOLIC PANEL
ALK PHOS: 40 U/L (ref 38–126)
ALT: 31 U/L (ref 0–44)
ANION GAP: 8 (ref 5–15)
AST: 21 U/L (ref 15–41)
Albumin: 2.5 g/dL — ABNORMAL LOW (ref 3.5–5.0)
BUN: 61 mg/dL — ABNORMAL HIGH (ref 8–23)
CALCIUM: 7.8 mg/dL — AB (ref 8.9–10.3)
CO2: 12 mmol/L — ABNORMAL LOW (ref 22–32)
Chloride: 120 mmol/L — ABNORMAL HIGH (ref 98–111)
Creatinine, Ser: 2.23 mg/dL — ABNORMAL HIGH (ref 0.44–1.00)
GFR calc non Af Amer: 22 mL/min — ABNORMAL LOW (ref >60)
GFR, EST AFRICAN AMERICAN: 25 mL/min — AB (ref >60)
Glucose, Bld: 184 mg/dL — ABNORMAL HIGH (ref 70–99)
Potassium: 4.7 mmol/L (ref 3.5–5.1)
SODIUM: 140 mmol/L (ref 135–145)
Total Bilirubin: 0.5 mg/dL (ref 0.3–1.2)
Total Protein: 4.7 g/dL — ABNORMAL LOW (ref 6.5–8.1)

## 2017-12-22 LAB — MAGNESIUM: Magnesium: 1.8 mg/dL (ref 1.7–2.4)

## 2017-12-22 MED ORDER — BORTEZOMIB CHEMO IV INJECTION 3.5 MG(1MG/ML)
1.3000 mg/m2 | Freq: Once | INTRAMUSCULAR | Status: AC
  Administered 2017-12-22: 1.8 mg via INTRAVENOUS
  Filled 2017-12-22: qty 1.8

## 2017-12-22 MED ORDER — SODIUM CHLORIDE 0.9 % IV SOLN
Freq: Once | INTRAVENOUS | Status: AC
  Administered 2017-12-22: 595 mg via INTRAVENOUS
  Filled 2017-12-22: qty 7

## 2017-12-22 MED ORDER — FUROSEMIDE 10 MG/ML IJ SOLN
40.0000 mg | Freq: Once | INTRAMUSCULAR | Status: AC
  Administered 2017-12-22: 40 mg via INTRAVENOUS
  Filled 2017-12-22: qty 4

## 2017-12-22 MED ORDER — SODIUM CHLORIDE 0.9 % IV SOLN
7.5000 mg/m2 | Freq: Once | INTRAVENOUS | Status: AC
  Administered 2017-12-22: 10 mg via INTRAVENOUS
  Filled 2017-12-22: qty 5

## 2017-12-22 MED ORDER — POTASSIUM CHLORIDE 2 MEQ/ML IV SOLN
Freq: Once | INTRAVENOUS | Status: AC
  Administered 2017-12-22: 16:00:00 via INTRAVENOUS
  Filled 2017-12-22: qty 10

## 2017-12-22 MED ORDER — FUROSEMIDE 10 MG/ML IJ SOLN: 40.0000 mg | Freq: Once | INTRAMUSCULAR | Status: DC

## 2017-12-22 MED ORDER — DEXAMETHASONE 4 MG PO TABS
40.0000 mg | ORAL_TABLET | Freq: Once | ORAL | Status: AC
  Administered 2017-12-22: 40 mg via ORAL
  Filled 2017-12-22: qty 10

## 2017-12-22 NOTE — Progress Notes (Signed)
Ms. Toohey continues to do well.  Her primary now is her back.  She does not like lying in her bed.  She says that the bed makes her back hurt.  I recommended to her that she could sit in a recliner or she could sit or lie down on the bench next to the window which might be a little bit more supportive.  She is having no nausea or vomiting.  She is having no diarrhea.  There is no shortness of breath or cough.  Her kidney function continues to improve.  Her creatinine is now down to 2.23.  Her blood sugar is 184.  Her white cell count is 13.1 and hemoglobin 6.5.  Platelet count 19,000.  Her appetite I think is doing okay.  Her vital signs show temperature of 97.9.  Pulse 84.  Blood pressure 107/68.  Her  intake is 1.2 L greater than her output.  I am probable had to give her a little bit of Lasix.  Her potassium is 4.7 so she could handle some Lasix.  Her lungs sound pretty clear bilaterally.  Cardiac exam regular rate and rhythm.  Oral exam shows no mucositis.  Abdomen is soft.  She has good bowel sounds.  Extremities shows the chronic stable edema in her lower legs.  Neurological exam is nonfocal.  She has the short-term memory issues.  Ms. Wiegel has the light chain myeloma.  She is refractory.  Again she is responding as her renal function is improving.  Again, the intake/output is getting a little bit out of balance.  I do not want to see you going to volume overload.  We will give her some Lasix today.  I will hold on a transfusion for right now.  The staff up on [address] are doing a fantastic job with her.   Lattie Haw, MD  Colossians 3:23

## 2017-12-23 LAB — CBC WITH DIFFERENTIAL/PLATELET
BASOS ABS: 0 10*3/uL (ref 0.0–0.1)
Basophils Relative: 0 %
EOS PCT: 0 %
Eosinophils Absolute: 0 10*3/uL (ref 0.0–0.7)
HCT: 15.8 % — ABNORMAL LOW (ref 36.0–46.0)
Hemoglobin: 5.3 g/dL — CL (ref 12.0–15.0)
LYMPHS ABS: 0.2 10*3/uL — AB (ref 0.7–4.0)
LYMPHS PCT: 2 %
MCH: 29.3 pg (ref 26.0–34.0)
MCHC: 33.5 g/dL (ref 30.0–36.0)
MCV: 87.3 fL (ref 78.0–100.0)
MONO ABS: 0.2 10*3/uL (ref 0.1–1.0)
Monocytes Relative: 2 %
Neutro Abs: 6.5 10*3/uL (ref 1.7–7.7)
Neutrophils Relative %: 96 %
PLATELETS: 10 10*3/uL — AB (ref 150–400)
RBC: 1.81 MIL/uL — ABNORMAL LOW (ref 3.87–5.11)
RDW: 19.8 % — ABNORMAL HIGH (ref 11.5–15.5)
WBC: 6.8 10*3/uL (ref 4.0–10.5)

## 2017-12-23 LAB — COMPREHENSIVE METABOLIC PANEL
ALT: 27 U/L (ref 0–44)
AST: 19 U/L (ref 15–41)
Albumin: 2.3 g/dL — ABNORMAL LOW (ref 3.5–5.0)
Alkaline Phosphatase: 37 U/L — ABNORMAL LOW (ref 38–126)
Anion gap: 5 (ref 5–15)
BUN: 68 mg/dL — ABNORMAL HIGH (ref 8–23)
CHLORIDE: 121 mmol/L — AB (ref 98–111)
CO2: 13 mmol/L — AB (ref 22–32)
Calcium: 7.6 mg/dL — ABNORMAL LOW (ref 8.9–10.3)
Creatinine, Ser: 1.94 mg/dL — ABNORMAL HIGH (ref 0.44–1.00)
GFR, EST AFRICAN AMERICAN: 30 mL/min — AB (ref >60)
GFR, EST NON AFRICAN AMERICAN: 26 mL/min — AB (ref >60)
Glucose, Bld: 109 mg/dL — ABNORMAL HIGH (ref 70–99)
POTASSIUM: 4.8 mmol/L (ref 3.5–5.1)
SODIUM: 139 mmol/L (ref 135–145)
Total Bilirubin: 0.4 mg/dL (ref 0.3–1.2)
Total Protein: 4.3 g/dL — ABNORMAL LOW (ref 6.5–8.1)

## 2017-12-23 LAB — PREPARE RBC (CROSSMATCH)

## 2017-12-23 LAB — MAGNESIUM: MAGNESIUM: 1.7 mg/dL (ref 1.7–2.4)

## 2017-12-23 MED ORDER — FUROSEMIDE 10 MG/ML IJ SOLN
20.0000 mg | Freq: Once | INTRAMUSCULAR | Status: AC
  Administered 2017-12-24: 20 mg via INTRAVENOUS
  Filled 2017-12-23: qty 2

## 2017-12-23 MED ORDER — CIPROFLOXACIN HCL 500 MG PO TABS
500.0000 mg | ORAL_TABLET | Freq: Every day | ORAL | Status: DC
  Administered 2017-12-23 – 2017-12-24 (×2): 500 mg via ORAL
  Filled 2017-12-23 (×2): qty 1

## 2017-12-23 MED ORDER — SODIUM CHLORIDE 0.9% IV SOLUTION
Freq: Once | INTRAVENOUS | Status: AC
  Administered 2017-12-23: 17:00:00 via INTRAVENOUS

## 2017-12-23 MED ORDER — FUROSEMIDE 10 MG/ML IJ SOLN
40.0000 mg | Freq: Once | INTRAMUSCULAR | Status: AC
  Administered 2017-12-23: 40 mg via INTRAVENOUS
  Filled 2017-12-23: qty 4

## 2017-12-23 NOTE — Progress Notes (Signed)
As I expected, Ms. Venditto his kidney function is improving.  Her creatinine now is 1.94.  This is a very good indicator that she still has responsive disease.  The great news however is that she will go down to [place] on Monday to see about entering into 1 of the CAR-T protocols that they have down there.  I think this would be a great option for her.  Her white cell count is 6.8.  Hemoglobin 5.3.  Platelet count 10,000.  I will give her 2 units of blood and a unit of platelets prior to her discharge.  I suspect her chemotherapy will finish up later on today and she will get the transfusion overnight.  Her blood sugar is doing well at 109.  Her albumin is 1.7.  She is having no problems with nausea or vomiting.  The chest x-ray that she had done yesterday showed some atelectasis.  I need to make sure that she uses a incentive spirometer.  She is having no problems with bowels or bladder.  Her intake now is about 5 L ahead.  I will have to give her some Lasix today.  Her lungs are clear.  Cardiac exam regular rate and rhythm.  Oral exam shows no mucositis.  Abdomen is soft.  Extremities still shows some slight edema in her legs.  Again, we will finish up the chemotherapy today.  She will get 2 units of blood and some platelets.  I hope that we will be able to get her out tomorrow.  Lattie Haw, MD  Philippians 4:13

## 2017-12-24 DIAGNOSIS — N2889 Other specified disorders of kidney and ureter: Secondary | ICD-10-CM

## 2017-12-24 DIAGNOSIS — D8989 Other specified disorders involving the immune mechanism, not elsewhere classified: Secondary | ICD-10-CM

## 2017-12-24 DIAGNOSIS — D61818 Other pancytopenia: Secondary | ICD-10-CM

## 2017-12-24 LAB — COMPREHENSIVE METABOLIC PANEL
ALBUMIN: 2.6 g/dL — AB (ref 3.5–5.0)
ALK PHOS: 43 U/L (ref 38–126)
ALT: 38 U/L (ref 0–44)
AST: 32 U/L (ref 15–41)
Anion gap: 9 (ref 5–15)
BILIRUBIN TOTAL: 0.4 mg/dL (ref 0.3–1.2)
BUN: 72 mg/dL — AB (ref 8–23)
CALCIUM: 8.1 mg/dL — AB (ref 8.9–10.3)
CO2: 15 mmol/L — ABNORMAL LOW (ref 22–32)
Chloride: 116 mmol/L — ABNORMAL HIGH (ref 98–111)
Creatinine, Ser: 1.82 mg/dL — ABNORMAL HIGH (ref 0.44–1.00)
GFR calc Af Amer: 32 mL/min — ABNORMAL LOW (ref >60)
GFR calc non Af Amer: 28 mL/min — ABNORMAL LOW (ref >60)
GLUCOSE: 111 mg/dL — AB (ref 70–99)
POTASSIUM: 4.8 mmol/L (ref 3.5–5.1)
Sodium: 140 mmol/L (ref 135–145)
Total Protein: 4.8 g/dL — ABNORMAL LOW (ref 6.5–8.1)

## 2017-12-24 LAB — CBC WITH DIFFERENTIAL/PLATELET
BASOS ABS: 0 10*3/uL (ref 0.0–0.1)
Basophils Relative: 0 %
Eosinophils Absolute: 0 10*3/uL (ref 0.0–0.7)
Eosinophils Relative: 0 %
HEMATOCRIT: 24.9 % — AB (ref 36.0–46.0)
HEMOGLOBIN: 8.6 g/dL — AB (ref 12.0–15.0)
Lymphocytes Relative: 4 %
Lymphs Abs: 0.1 10*3/uL — ABNORMAL LOW (ref 0.7–4.0)
MCH: 28.4 pg (ref 26.0–34.0)
MCHC: 34.5 g/dL (ref 30.0–36.0)
MCV: 82.2 fL (ref 78.0–100.0)
Monocytes Absolute: 0.1 10*3/uL (ref 0.1–1.0)
Monocytes Relative: 2 %
NEUTROS ABS: 2.9 10*3/uL (ref 1.7–7.7)
NEUTROS PCT: 94 %
Platelets: 41 10*3/uL — ABNORMAL LOW (ref 150–400)
RBC: 3.03 MIL/uL — ABNORMAL LOW (ref 3.87–5.11)
RDW: 17.4 % — ABNORMAL HIGH (ref 11.5–15.5)
WBC: 3.1 10*3/uL — ABNORMAL LOW (ref 4.0–10.5)

## 2017-12-24 LAB — MAGNESIUM: Magnesium: 1.8 mg/dL (ref 1.7–2.4)

## 2017-12-24 MED ORDER — FLUCONAZOLE 100 MG PO TABS: 100.0000 mg | ORAL_TABLET | Freq: Every day | ORAL | 2 refills | Status: AC

## 2017-12-24 MED ORDER — CIPROFLOXACIN HCL 500 MG PO TABS: 500.0000 mg | ORAL_TABLET | Freq: Every day | ORAL | 2 refills | Status: AC

## 2017-12-24 MED ORDER — SODIUM BICARBONATE/SODIUM CHLORIDE MOUTHWASH: 1.0000 "application " | OROMUCOSAL | 1 refills | Status: AC

## 2017-12-24 NOTE — Discharge Summary (Signed)
@LOGO @ Hematology and Oncology Follow Up Visit  Jeanne Martinez [id number] 08-18-1950 67 y.o. 12/24/2017   Principle Diagnosis:   Refractory kappa light chain myeloma  Worsening renal insufficiency  Pancytopenia  Current Therapy:    Status post 3 cycles of VD-PACE     Hospital course::  Jeanne Martinez was admitted for her third cycle of chemotherapy.  She had a very nice response to her first 2 cycles of VD-PACE.  Her Kappa light chain went from 21,000 down to 370.  However, her Kappa light chain began to go back up.  It was up to 11,000.  Her renal function was up to 3.9 with her creatinine.  We admitted her.  She was admitted on December 19, 2017.  She was admitted to [facility]  She had her Port-A-Cath accessed.  Patient did have a peripheral IV placed.  She had a chest x-ray which was negative for any pneumonia.  We went ahead and start her on dose reduced chemotherapy with VD-PACE.  She tolerated this well as always.  She had no nausea or vomiting.  As expected, she did start to have some fluid retention.  We did another chest x-ray on her.  This showed some atelectasis at the bases.  We gave her some Lasix.  On Thursday, September 26, we heard that she would be considered for the CAR-T protocol in [place].  She is going down on September 30 for evaluation.  She ate well.  She had no nausea or vomiting.  She had no mouth sores.  On Saturday, September 28, her labs showed a white cell count of 6.8.  Hemoglobin 5.3 and platelet count 10,000.  She went ahead and got 2 units of blood and 1 unit of pheresis platelets.  Her kidney function improved incredibly well.  Her kidney function went from a creatinine of 4.1 down to a creatinine of 1.94.  On the day of discharge, her creatinine was 1.82.  Her BUN was 72, most of which was elevated because of her Decadron.  Her magnesium was 1.8.  Her white cell count of 3.1.  Hemoglobin 8.6 and platelet count 41,000.  Her potassium was  4.8.  She was eating well.  She had a really good diuresis.  She still had over 5 L of fluid I had.  However, I felt that she would be able to mobilize this at home.   Medications:  Current Facility-Administered Medications:  .  0.9 %  sodium chloride infusion, , Intravenous, Continuous, Ennever, Rudell Cobb, MD, Stopped at 12/24/17 [phone] .  antiseptic oral rinse (BIOTENE) solution 15 mL, 15 mL, Mouth Rinse, Q4H, Ennever, Rudell Cobb, MD, 15 mL at 12/24/17 0411 .  ciprofloxacin (CIPRO) tablet 500 mg, 500 mg, Oral, Q breakfast, Ennever, Rudell Cobb, MD, 500 mg at 12/23/17 1110 .  dextrose 5 % with KCl 20 mEq / L  infusion, 20 mEq, Intravenous, Continuous, Ennever, Rudell Cobb, MD, Last Rate: 50 mL/hr at 12/24/17 0424 .  famciclovir [facility]) tablet 250 mg, 250 mg, Oral, Daily, Volanda Napoleon, MD, 250 mg at 12/23/17 1110 .  fluconazole (DIFLUCAN) tablet 100 mg, 100 mg, Oral, Daily, Volanda Napoleon, MD, 100 mg at 12/23/17 1110 .  LORazepam (ATIVAN) tablet 0.5 mg, 0.5 mg, Oral, Q6H PRN, Volanda Napoleon, MD .  ondansetron (ZOFRAN) tablet 8 mg, 8 mg, Oral, BID PRN, Volanda Napoleon, MD .  prochlorperazine (COMPAZINE) tablet 10 mg, 10 mg, Oral, Q6H PRN, Volanda Napoleon, MD .  sodium bicarbonate/sodium chloride mouthwash 1034ml, , Mouth Rinse, Q4H, Ennever, Rudell Cobb, MD  Facility-Administered Medications Ordered in Other Encounters:  .  sodium chloride flush (NS) 0.9 % injection 10 mL, 10 mL, Intravenous, PRN, [place], Sarah M, NP, 10 mL at 12/04/17 1201  Allergies: No Known Allergies  Past Medical History, Surgical history, Social history, and Family History were reviewed and updated.  Review of Systems: Review of Systems - Oncology  Physical Exam:  height is 5\' 3"  (1.6 m) and weight is 113 lb 5.1 oz (51.4 kg). Her oral temperature is 97.5 F (36.4 C) (abnormal). Her blood pressure is 127/71 and her pulse is 60. Her respiration is 16 and oxygen saturation is 100%.   Wt Readings from Last 3  Encounters:  12/24/17 113 lb 5.1 oz (51.4 kg)  12/18/17 95 lb 12 oz (43.4 kg)  11/28/17 93 lb 4 oz (42.3 kg)    Physical Exam: Shows a vital signs with a temperature of 97.5.  Pulse 60.  Blood pressure 127/71.  Head neck exam shows no mucositis.  There is no adenopathy in the neck.  Lungs are clear bilaterally.  Cardiac exam regular rate and rhythm with no murmurs, rubs or bruits.  Abdomen is soft.  She has good bowel sounds.  There is no fluid wave.  There is no palpable liver or spleen tip.  Extremities shows some mild edema in her lower legs which is somewhat improved.  Skin exam showed no rashes, ecchymoses or petechia.  Neurological exam shows the chronic issues with her short-term memory.   Lab Results  Component Value Date   WBC 3.1 (L) 12/24/2017   HGB 8.6 (L) 12/24/2017   HCT 24.9 (L) 12/24/2017   MCV 82.2 12/24/2017   PLT 41 (L) 12/24/2017   @LASTCHEMISTRY @    Impression and Plan: Jeanne Martinez is a 67 year old white female.  She was admitted on 12/19/2017.  She is being discharged on 12/24/2017.  She is responding as always as her creatinine is coming down nicely.  She is on prophylactic antibiotics with ciprofloxacin, Diflucan, and Famvir.  She has no dietary restrictions.  She will come to the office on 12/25/2017 for a Neulasta injection.  We will also check her labs.  She will go down to [place] to be seen for the CAR-T protocol.  Of note, she had no problems with bowels or bladder.  I will plan to see her back myself in about 10 days, depending on the findings and evaluation down in [place].   Lattie Haw, MD   Volanda Napoleon, MD 9/29/20198:11 AM

## 2017-12-24 NOTE — Progress Notes (Signed)
Patient discharged to home, all discharged medications and instructions reviewed with patient and son, christopher.  Patient to be assisted to vehicle by wheelchair.

## 2017-12-25 ENCOUNTER — Inpatient Hospital Stay: Payer: Medicare Other

## 2017-12-25 ENCOUNTER — Other Ambulatory Visit: Payer: Self-pay

## 2017-12-25 VITALS — BP 135/80 | HR 70 | Temp 97.6°F | Resp 18

## 2017-12-25 DIAGNOSIS — N179 Acute kidney failure, unspecified: Secondary | ICD-10-CM | POA: Diagnosis not present

## 2017-12-25 DIAGNOSIS — D8989 Other specified disorders involving the immune mechanism, not elsewhere classified: Secondary | ICD-10-CM

## 2017-12-25 DIAGNOSIS — C9002 Multiple myeloma in relapse: Secondary | ICD-10-CM | POA: Diagnosis not present

## 2017-12-25 DIAGNOSIS — C9000 Multiple myeloma not having achieved remission: Secondary | ICD-10-CM

## 2017-12-25 LAB — TYPE AND SCREEN
ABO/RH(D): A POS
Antibody Screen: NEGATIVE
UNIT DIVISION: 0
Unit division: 0

## 2017-12-25 LAB — CMP (CANCER CENTER ONLY)
ALK PHOS: 59 U/L (ref 26–84)
ALT: 43 U/L (ref 10–47)
ANION GAP: 5 (ref 5–15)
AST: 34 U/L (ref 11–38)
Albumin: 2.7 g/dL — ABNORMAL LOW (ref 3.5–5.0)
BILIRUBIN TOTAL: 0.5 mg/dL (ref 0.2–1.6)
BUN: 71 mg/dL — ABNORMAL HIGH (ref 7–22)
CALCIUM: 8.8 mg/dL (ref 8.0–10.3)
CO2: 16 mmol/L — ABNORMAL LOW (ref 18–33)
Chloride: 117 mmol/L — ABNORMAL HIGH (ref 98–108)
Creatinine: 1.6 mg/dL — ABNORMAL HIGH (ref 0.60–1.20)
GLUCOSE: 96 mg/dL (ref 73–118)
Potassium: 4.2 mmol/L (ref 3.3–4.7)
Sodium: 138 mmol/L (ref 128–145)
TOTAL PROTEIN: 5.1 g/dL — AB (ref 6.4–8.1)

## 2017-12-25 LAB — CBC WITH DIFFERENTIAL (CANCER CENTER ONLY)
BASOS ABS: 0 10*3/uL (ref 0.0–0.1)
Basophils Relative: 0 %
Eosinophils Absolute: 0 10*3/uL (ref 0.0–0.5)
Eosinophils Relative: 1 %
HEMATOCRIT: 27.8 % — AB (ref 34.8–46.6)
Hemoglobin: 9.4 g/dL — ABNORMAL LOW (ref 11.6–15.9)
LYMPHS PCT: 6 %
Lymphs Abs: 0.1 10*3/uL — ABNORMAL LOW (ref 0.9–3.3)
MCH: 27.8 pg (ref 26.0–34.0)
MCHC: 33.8 g/dL (ref 32.0–36.0)
MCV: 82.2 fL (ref 81.0–101.0)
Monocytes Absolute: 0 10*3/uL — ABNORMAL LOW (ref 0.1–0.9)
Monocytes Relative: 1 %
NEUTROS ABS: 1.7 10*3/uL (ref 1.5–6.5)
Neutrophils Relative %: 92 %
Platelet Count: 27 10*3/uL — ABNORMAL LOW (ref 145–400)
RBC: 3.38 MIL/uL — AB (ref 3.70–5.32)
RDW: 18 % — ABNORMAL HIGH (ref 11.1–15.7)
WBC: 1.8 10*3/uL — AB (ref 3.9–10.0)

## 2017-12-25 LAB — BPAM PLATELET PHERESIS
BLOOD PRODUCT EXPIRATION DATE: 201909302359
ISSUE DATE / TIME: 201909281655
UNIT TYPE AND RH: 6200

## 2017-12-25 LAB — PREPARE PLATELET PHERESIS: Unit division: 0

## 2017-12-25 LAB — BPAM RBC
Blood Product Expiration Date: 201910182359
Blood Product Expiration Date: 201910282359
ISSUE DATE / TIME: 201909282214
ISSUE DATE / TIME: 201909290059
Unit Type and Rh: 6200
Unit Type and Rh: 6200

## 2017-12-25 LAB — SAMPLE TO BLOOD BANK

## 2017-12-25 MED ORDER — HEPARIN SOD (PORK) LOCK FLUSH 100 UNIT/ML IV SOLN
500.0000 [IU] | Freq: Once | INTRAVENOUS | Status: AC
  Administered 2017-12-25: 500 [IU] via INTRAVENOUS
  Filled 2017-12-25: qty 5

## 2017-12-25 MED ORDER — PEGFILGRASTIM-CBQV 6 MG/0.6ML ~~LOC~~ SOSY: 6.0000 mg | PREFILLED_SYRINGE | Freq: Once | SUBCUTANEOUS | Status: DC

## 2017-12-25 MED ORDER — PEGFILGRASTIM-CBQV 6 MG/0.6ML ~~LOC~~ SOSY
6.0000 mg | PREFILLED_SYRINGE | Freq: Once | SUBCUTANEOUS | Status: DC
  Administered 2017-12-25: 6 mg via SUBCUTANEOUS

## 2017-12-25 MED ORDER — SODIUM CHLORIDE 0.9% FLUSH
10.0000 mL | INTRAVENOUS | Status: DC | PRN
  Administered 2017-12-25: 10 mL via INTRAVENOUS
  Filled 2017-12-25: qty 10

## 2017-12-25 MED ORDER — PEGFILGRASTIM-CBQV 6 MG/0.6ML ~~LOC~~ SOSY
PREFILLED_SYRINGE | SUBCUTANEOUS | Status: AC
  Filled 2017-12-25: qty 0.6

## 2017-12-25 NOTE — Patient Instructions (Signed)
Implanted Port Home Guide An implanted port is a type of central line that is placed under the skin. Central lines are used to provide IV access when treatment or nutrition needs to be given through a person's veins. Implanted ports are used for long-term IV access. An implanted port may be placed because:  You need IV medicine that would be irritating to the small veins in your hands or arms.  You need long-term IV medicines, such as antibiotics.  You need IV nutrition for a long period.  You need frequent blood draws for lab tests.  You need dialysis.  Implanted ports are usually placed in the chest area, but they can also be placed in the upper arm, the abdomen, or the leg. An implanted port has two main parts:  Reservoir. The reservoir is round and will appear as a small, raised area under your skin. The reservoir is the part where a needle is inserted to give medicines or draw blood.  Catheter. The catheter is a thin, flexible tube that extends from the reservoir. The catheter is placed into a large vein. Medicine that is inserted into the reservoir goes into the catheter and then into the vein.  How will I care for my incision site? Do not get the incision site wet. Bathe or shower as directed by your health care provider. How is my port accessed? Special steps must be taken to access the port:  Before the port is accessed, a numbing cream can be placed on the skin. This helps numb the skin over the port site.  Your health care provider uses a sterile technique to access the port. ? Your health care provider must put on a mask and sterile gloves. ? The skin over your port is cleaned carefully with an antiseptic and allowed to dry. ? The port is gently pinched between sterile gloves, and a needle is inserted into the port.  Only "non-coring" port needles should be used to access the port. Once the port is accessed, a blood return should be checked. This helps ensure that the port  is in the vein and is not clogged.  If your port needs to remain accessed for a constant infusion, a clear (transparent) bandage will be placed over the needle site. The bandage and needle will need to be changed every week, or as directed by your health care provider.  Keep the bandage covering the needle clean and dry. Do not get it wet. Follow your health care provider's instructions on how to take a shower or bath while the port is accessed.  If your port does not need to stay accessed, no bandage is needed over the port.  What is flushing? Flushing helps keep the port from getting clogged. Follow your health care provider's instructions on how and when to flush the port. Ports are usually flushed with saline solution or a medicine called heparin. The need for flushing will depend on how the port is used.  If the port is used for intermittent medicines or blood draws, the port will need to be flushed: ? After medicines have been given. ? After blood has been drawn. ? As part of routine maintenance.  If a constant infusion is running, the port may not need to be flushed.  How long will my port stay implanted? The port can stay in for as long as your health care provider thinks it is needed. When it is time for the port to come out, surgery will be   done to remove it. The procedure is similar to the one performed when the port was put in. When should I seek immediate medical care? When you have an implanted port, you should seek immediate medical care if:  You notice a bad smell coming from the incision site.  You have swelling, redness, or drainage at the incision site.  You have more swelling or pain at the port site or the surrounding area.  You have a fever that is not controlled with medicine.  This information is not intended to replace advice given to you by your health care provider. Make sure you discuss any questions you have with your health care provider. Document  Released: 03/14/2005 Document Revised: 08/20/2015 Document Reviewed: 11/19/2012 Elsevier Interactive Patient Education  2017 Elsevier Inc.  

## 2017-12-27 ENCOUNTER — Other Ambulatory Visit: Payer: Self-pay | Admitting: *Deleted

## 2017-12-27 ENCOUNTER — Inpatient Hospital Stay: Payer: Medicare Other

## 2017-12-27 ENCOUNTER — Other Ambulatory Visit: Payer: Self-pay | Admitting: Family

## 2017-12-27 ENCOUNTER — Telehealth: Payer: Self-pay | Admitting: *Deleted

## 2017-12-27 ENCOUNTER — Inpatient Hospital Stay: Payer: Medicare Other | Attending: Hematology & Oncology

## 2017-12-27 DIAGNOSIS — C9000 Multiple myeloma not having achieved remission: Secondary | ICD-10-CM

## 2017-12-27 DIAGNOSIS — D696 Thrombocytopenia, unspecified: Secondary | ICD-10-CM

## 2017-12-27 DIAGNOSIS — C9002 Multiple myeloma in relapse: Secondary | ICD-10-CM | POA: Insufficient documentation

## 2017-12-27 DIAGNOSIS — N179 Acute kidney failure, unspecified: Secondary | ICD-10-CM | POA: Diagnosis not present

## 2017-12-27 DIAGNOSIS — D638 Anemia in other chronic diseases classified elsewhere: Secondary | ICD-10-CM | POA: Diagnosis not present

## 2017-12-27 LAB — CBC WITH DIFFERENTIAL (CANCER CENTER ONLY)
BASOS ABS: 0 10*3/uL (ref 0.0–0.1)
Basophils Relative: 0 %
EOS PCT: 3 %
Eosinophils Absolute: 0 10*3/uL (ref 0.0–0.5)
HCT: 24.4 % — ABNORMAL LOW (ref 34.8–46.6)
Hemoglobin: 8.1 g/dL — ABNORMAL LOW (ref 11.6–15.9)
LYMPHS ABS: 0.1 10*3/uL — AB (ref 0.9–3.3)
LYMPHS PCT: 10 %
MCH: 27.9 pg (ref 26.0–34.0)
MCHC: 33.2 g/dL (ref 32.0–36.0)
MCV: 84.1 fL (ref 81.0–101.0)
Monocytes Absolute: 0 10*3/uL — ABNORMAL LOW (ref 0.1–0.9)
Monocytes Relative: 2 %
NEUTROS ABS: 0.5 10*3/uL — AB (ref 1.5–6.5)
Neutrophils Relative %: 85 %
Platelet Count: 7 10*3/uL — CL (ref 145–400)
RBC: 2.9 MIL/uL — AB (ref 3.70–5.32)
RDW: 17.3 % — ABNORMAL HIGH (ref 11.1–15.7)
WBC Count: 0.6 10*3/uL — CL (ref 3.9–10.0)

## 2017-12-27 LAB — CMP (CANCER CENTER ONLY)
ALK PHOS: 53 U/L (ref 26–84)
ALT: 31 U/L (ref 10–47)
ANION GAP: 4 — AB (ref 5–15)
AST: 24 U/L (ref 11–38)
Albumin: 2.7 g/dL — ABNORMAL LOW (ref 3.5–5.0)
BILIRUBIN TOTAL: 0.6 mg/dL (ref 0.2–1.6)
BUN: 54 mg/dL — ABNORMAL HIGH (ref 7–22)
CALCIUM: 8.4 mg/dL (ref 8.0–10.3)
CO2: 18 mmol/L (ref 18–33)
Chloride: 115 mmol/L — ABNORMAL HIGH (ref 98–108)
Creatinine: 1.8 mg/dL — ABNORMAL HIGH (ref 0.60–1.20)
GLUCOSE: 136 mg/dL — AB (ref 73–118)
POTASSIUM: 3.7 mmol/L (ref 3.3–4.7)
Sodium: 137 mmol/L (ref 128–145)
TOTAL PROTEIN: 4.8 g/dL — AB (ref 6.4–8.1)

## 2017-12-27 LAB — SAMPLE TO BLOOD BANK

## 2017-12-27 MED ORDER — HEPARIN SOD (PORK) LOCK FLUSH 100 UNIT/ML IV SOLN
500.0000 [IU] | Freq: Every day | INTRAVENOUS | Status: AC | PRN
  Administered 2017-12-27: 500 [IU]
  Filled 2017-12-27: qty 5

## 2017-12-27 MED ORDER — SODIUM CHLORIDE 0.9% FLUSH
10.0000 mL | INTRAVENOUS | Status: AC | PRN
  Administered 2017-12-27: 10 mL
  Filled 2017-12-27: qty 10

## 2017-12-27 MED ORDER — ACETAMINOPHEN 325 MG PO TABS
650.0000 mg | ORAL_TABLET | Freq: Once | ORAL | Status: AC
  Administered 2017-12-27: 650 mg via ORAL

## 2017-12-27 MED ORDER — DIPHENHYDRAMINE HCL 25 MG PO CAPS
25.0000 mg | ORAL_CAPSULE | Freq: Once | ORAL | Status: AC
  Administered 2017-12-27: 25 mg via ORAL

## 2017-12-27 MED ORDER — ACETAMINOPHEN 325 MG PO TABS
ORAL_TABLET | ORAL | Status: AC
  Filled 2017-12-27: qty 1

## 2017-12-27 MED ORDER — SODIUM CHLORIDE 0.9% IV SOLUTION
250.0000 mL | Freq: Once | INTRAVENOUS | Status: AC
  Administered 2017-12-27: 250 mL via INTRAVENOUS
  Filled 2017-12-27: qty 250

## 2017-12-27 MED ORDER — ACETAMINOPHEN 325 MG PO TABS
ORAL_TABLET | ORAL | Status: AC
  Filled 2017-12-27: qty 2

## 2017-12-27 MED ORDER — FUROSEMIDE 10 MG/ML IJ SOLN: 20.0000 mg | Freq: Once | INTRAMUSCULAR | Status: DC

## 2017-12-27 MED ORDER — DIPHENHYDRAMINE HCL 25 MG PO CAPS
ORAL_CAPSULE | ORAL | Status: AC
  Filled 2017-12-27: qty 1

## 2017-12-27 NOTE — Patient Instructions (Signed)
Plasmapheresis, Adult, Care After  Refer to this sheet in the next few weeks. These instructions provide you with information on caring for yourself after your procedure. Your health care provider may also give you more specific instructions. Your treatment has been planned according to current medical practices, but problems sometimes occur. Call your health care provider if you have any problems or questions after your procedure.  What can I expect after the procedure?  After your procedure, it is common:  · To have fatigue.  · To have soreness and bruising at your IV tube insertion sites.  · To bleed more easily during the several hours after your procedure.    Follow these instructions at home:  · Do not shave and do not cut your nails for several hours after treatment. Medicine that is used during the procedure slows blood clotting, and that makes it easier for you to bleed if you cut or injure yourself.  · You can eat what you usually do.  · You can do all of your regular activities.  · Go to all of your additional treatments and keep all follow-up visits as directed by your health care provider. This is important.  · Check your IV tube insertion site every day for signs of infection. Watch for:  ? Redness.  ? Swelling.  ? A feeling of warmth.  Contact a health care provider if:  · You have a fever or chills.  · You have redness, swelling, or a feeling of warmth at an IV tube insertion site.  · You have any unusual bruising or bleeding.  · You develop a rash or itching.  Get help right away if:  · You have persistent bleeding.  · You start to wheeze.  · You have trouble breathing.  · You have chest pain.  This information is not intended to replace advice given to you by your health care provider. Make sure you discuss any questions you have with your health care provider.  Document Released: 04/04/2014 Document Revised: 08/20/2015 Document Reviewed: 08/07/2013  Elsevier Interactive Patient Education © 2018  [employer]

## 2017-12-27 NOTE — Telephone Encounter (Signed)
Critical Value WBC 0.6 Plt 7 Dr Marin Olp notified. Orders will be placed

## 2017-12-27 NOTE — Patient Instructions (Signed)
Implanted Port Home Guide An implanted port is a type of central line that is placed under the skin. Central lines are used to provide IV access when treatment or nutrition needs to be given through a person's veins. Implanted ports are used for long-term IV access. An implanted port may be placed because:  You need IV medicine that would be irritating to the small veins in your hands or arms.  You need long-term IV medicines, such as antibiotics.  You need IV nutrition for a long period.  You need frequent blood draws for lab tests.  You need dialysis.  Implanted ports are usually placed in the chest area, but they can also be placed in the upper arm, the abdomen, or the leg. An implanted port has two main parts:  Reservoir. The reservoir is round and will appear as a small, raised area under your skin. The reservoir is the part where a needle is inserted to give medicines or draw blood.  Catheter. The catheter is a thin, flexible tube that extends from the reservoir. The catheter is placed into a large vein. Medicine that is inserted into the reservoir goes into the catheter and then into the vein.  How will I care for my incision site? Do not get the incision site wet. Bathe or shower as directed by your health care provider. How is my port accessed? Special steps must be taken to access the port:  Before the port is accessed, a numbing cream can be placed on the skin. This helps numb the skin over the port site.  Your health care provider uses a sterile technique to access the port. ? Your health care provider must put on a mask and sterile gloves. ? The skin over your port is cleaned carefully with an antiseptic and allowed to dry. ? The port is gently pinched between sterile gloves, and a needle is inserted into the port.  Only "non-coring" port needles should be used to access the port. Once the port is accessed, a blood return should be checked. This helps ensure that the port  is in the vein and is not clogged.  If your port needs to remain accessed for a constant infusion, a clear (transparent) bandage will be placed over the needle site. The bandage and needle will need to be changed every week, or as directed by your health care provider.  Keep the bandage covering the needle clean and dry. Do not get it wet. Follow your health care provider's instructions on how to take a shower or bath while the port is accessed.  If your port does not need to stay accessed, no bandage is needed over the port.  What is flushing? Flushing helps keep the port from getting clogged. Follow your health care provider's instructions on how and when to flush the port. Ports are usually flushed with saline solution or a medicine called heparin. The need for flushing will depend on how the port is used.  If the port is used for intermittent medicines or blood draws, the port will need to be flushed: ? After medicines have been given. ? After blood has been drawn. ? As part of routine maintenance.  If a constant infusion is running, the port may not need to be flushed.  How long will my port stay implanted? The port can stay in for as long as your health care provider thinks it is needed. When it is time for the port to come out, surgery will be   done to remove it. The procedure is similar to the one performed when the port was put in. When should I seek immediate medical care? When you have an implanted port, you should seek immediate medical care if:  You notice a bad smell coming from the incision site.  You have swelling, redness, or drainage at the incision site.  You have more swelling or pain at the port site or the surrounding area.  You have a fever that is not controlled with medicine.  This information is not intended to replace advice given to you by your health care provider. Make sure you discuss any questions you have with your health care provider. Document  Released: 03/14/2005 Document Revised: 08/20/2015 Document Reviewed: 11/19/2012 Elsevier Interactive Patient Education  2017 Elsevier Inc.  

## 2017-12-28 LAB — PREPARE PLATELET PHERESIS: Unit division: 0

## 2017-12-28 LAB — BPAM PLATELET PHERESIS
BLOOD PRODUCT EXPIRATION DATE: 201910032359
ISSUE DATE / TIME: 201910021135
UNIT TYPE AND RH: 7300

## 2017-12-29 ENCOUNTER — Other Ambulatory Visit: Payer: Self-pay | Admitting: Family

## 2017-12-29 ENCOUNTER — Inpatient Hospital Stay: Payer: Medicare Other

## 2017-12-29 VITALS — BP 122/66 | HR 88 | Temp 98.6°F | Resp 20

## 2017-12-29 DIAGNOSIS — C9002 Multiple myeloma in relapse: Secondary | ICD-10-CM | POA: Diagnosis not present

## 2017-12-29 DIAGNOSIS — D696 Thrombocytopenia, unspecified: Secondary | ICD-10-CM

## 2017-12-29 DIAGNOSIS — C9000 Multiple myeloma not having achieved remission: Secondary | ICD-10-CM

## 2017-12-29 LAB — CMP (CANCER CENTER ONLY)
ALT: 30 U/L (ref 10–47)
ANION GAP: 2 — AB (ref 5–15)
AST: 21 U/L (ref 11–38)
Albumin: 2.8 g/dL — ABNORMAL LOW (ref 3.5–5.0)
Alkaline Phosphatase: 61 U/L (ref 26–84)
BUN: 36 mg/dL — ABNORMAL HIGH (ref 7–22)
CO2: 22 mmol/L (ref 18–33)
Calcium: 8.4 mg/dL (ref 8.0–10.3)
Chloride: 113 mmol/L — ABNORMAL HIGH (ref 98–108)
Creatinine: 1.6 mg/dL — ABNORMAL HIGH (ref 0.60–1.20)
GLUCOSE: 94 mg/dL (ref 73–118)
POTASSIUM: 3.6 mmol/L (ref 3.3–4.7)
Sodium: 137 mmol/L (ref 128–145)
TOTAL PROTEIN: 5.1 g/dL — AB (ref 6.4–8.1)
Total Bilirubin: 0.5 mg/dL (ref 0.2–1.6)

## 2017-12-29 LAB — CBC WITH DIFFERENTIAL (CANCER CENTER ONLY)
Basophils Absolute: NONE SEEN 10*3/uL (ref 0.0–0.1)
Basophils Relative: NONE SEEN %
EOS PCT: NONE SEEN %
Eosinophils Absolute: NONE SEEN 10*3/uL (ref 0.0–0.5)
HCT: 22.8 % — ABNORMAL LOW (ref 34.8–46.6)
Hemoglobin: 7.5 g/dL — ABNORMAL LOW (ref 11.6–15.9)
LYMPHS ABS: NONE SEEN 10*3/uL (ref 0.9–3.3)
LYMPHS PCT: NONE SEEN %
MCH: 27.9 pg (ref 26.0–34.0)
MCHC: 32.9 g/dL (ref 32.0–36.0)
MCV: 84.8 fL (ref 81.0–101.0)
MONO ABS: NONE SEEN 10*3/uL (ref 0.1–0.9)
MONOS PCT: NONE SEEN %
Neutro Abs: NONE SEEN 10*3/uL (ref 1.5–6.5)
Neutrophils Relative %: NONE SEEN %
PLATELETS: 3 10*3/uL — AB (ref 145–400)
RBC: 2.69 MIL/uL — ABNORMAL LOW (ref 3.70–5.32)
RDW: 16.4 % — AB (ref 11.1–15.7)
WBC Count: 0.1 10*3/uL — CL (ref 3.9–10.0)

## 2017-12-29 LAB — SAMPLE TO BLOOD BANK

## 2017-12-29 MED ORDER — SODIUM CHLORIDE 0.9 % IJ SOLN
10.0000 mL | Freq: Once | INTRAMUSCULAR | Status: AC
  Administered 2017-12-29: 10 mL
  Filled 2017-12-29: qty 10

## 2017-12-29 MED ORDER — HEPARIN SOD (PORK) LOCK FLUSH 100 UNIT/ML IV SOLN
500.0000 [IU] | Freq: Every day | INTRAVENOUS | Status: AC | PRN
  Administered 2017-12-29: 500 [IU]
  Filled 2017-12-29: qty 5

## 2017-12-29 MED ORDER — SODIUM CHLORIDE 0.9% IV SOLUTION
250.0000 mL | Freq: Once | INTRAVENOUS | Status: AC
  Administered 2017-12-29: 250 mL via INTRAVENOUS
  Filled 2017-12-29: qty 250

## 2017-12-29 MED ORDER — SODIUM CHLORIDE 0.9% FLUSH
10.0000 mL | INTRAVENOUS | Status: AC | PRN
  Administered 2017-12-29: 10 mL
  Filled 2017-12-29: qty 10

## 2017-12-29 NOTE — Patient Instructions (Signed)
Implanted Port Home Guide An implanted port is a type of central line that is placed under the skin. Central lines are used to provide IV access when treatment or nutrition needs to be given through a person's veins. Implanted ports are used for long-term IV access. An implanted port may be placed because:  You need IV medicine that would be irritating to the small veins in your hands or arms.  You need long-term IV medicines, such as antibiotics.  You need IV nutrition for a long period.  You need frequent blood draws for lab tests.  You need dialysis.  Implanted ports are usually placed in the chest area, but they can also be placed in the upper arm, the abdomen, or the leg. An implanted port has two main parts:  Reservoir. The reservoir is round and will appear as a small, raised area under your skin. The reservoir is the part where a needle is inserted to give medicines or draw blood.  Catheter. The catheter is a thin, flexible tube that extends from the reservoir. The catheter is placed into a large vein. Medicine that is inserted into the reservoir goes into the catheter and then into the vein.  How will I care for my incision site? Do not get the incision site wet. Bathe or shower as directed by your health care provider. How is my port accessed? Special steps must be taken to access the port:  Before the port is accessed, a numbing cream can be placed on the skin. This helps numb the skin over the port site.  Your health care provider uses a sterile technique to access the port. ? Your health care provider must put on a mask and sterile gloves. ? The skin over your port is cleaned carefully with an antiseptic and allowed to dry. ? The port is gently pinched between sterile gloves, and a needle is inserted into the port.  Only "non-coring" port needles should be used to access the port. Once the port is accessed, a blood return should be checked. This helps ensure that the port  is in the vein and is not clogged.  If your port needs to remain accessed for a constant infusion, a clear (transparent) bandage will be placed over the needle site. The bandage and needle will need to be changed every week, or as directed by your health care provider.  Keep the bandage covering the needle clean and dry. Do not get it wet. Follow your health care provider's instructions on how to take a shower or bath while the port is accessed.  If your port does not need to stay accessed, no bandage is needed over the port.  What is flushing? Flushing helps keep the port from getting clogged. Follow your health care provider's instructions on how and when to flush the port. Ports are usually flushed with saline solution or a medicine called heparin. The need for flushing will depend on how the port is used.  If the port is used for intermittent medicines or blood draws, the port will need to be flushed: ? After medicines have been given. ? After blood has been drawn. ? As part of routine maintenance.  If a constant infusion is running, the port may not need to be flushed.  How long will my port stay implanted? The port can stay in for as long as your health care provider thinks it is needed. When it is time for the port to come out, surgery will be   done to remove it. The procedure is similar to the one performed when the port was put in. When should I seek immediate medical care? When you have an implanted port, you should seek immediate medical care if:  You notice a bad smell coming from the incision site.  You have swelling, redness, or drainage at the incision site.  You have more swelling or pain at the port site or the surrounding area.  You have a fever that is not controlled with medicine.  This information is not intended to replace advice given to you by your health care provider. Make sure you discuss any questions you have with your health care provider. Document  Released: 03/14/2005 Document Revised: 08/20/2015 Document Reviewed: 11/19/2012 Elsevier Interactive Patient Education  2017 Elsevier Inc.  

## 2018-01-01 ENCOUNTER — Telehealth: Payer: Self-pay | Admitting: *Deleted

## 2018-01-01 ENCOUNTER — Inpatient Hospital Stay: Payer: Medicare Other

## 2018-01-01 ENCOUNTER — Inpatient Hospital Stay (HOSPITAL_BASED_OUTPATIENT_CLINIC_OR_DEPARTMENT_OTHER): Payer: Medicare Other | Admitting: Hematology & Oncology

## 2018-01-01 VITALS — BP 119/69 | HR 89 | Temp 98.2°F | Resp 18 | Wt 93.8 lb

## 2018-01-01 DIAGNOSIS — N179 Acute kidney failure, unspecified: Secondary | ICD-10-CM | POA: Diagnosis not present

## 2018-01-01 DIAGNOSIS — C9000 Multiple myeloma not having achieved remission: Secondary | ICD-10-CM

## 2018-01-01 DIAGNOSIS — C9002 Multiple myeloma in relapse: Secondary | ICD-10-CM | POA: Diagnosis not present

## 2018-01-01 LAB — PREPARE PLATELET PHERESIS: Unit division: 0

## 2018-01-01 LAB — BPAM PLATELET PHERESIS
Blood Product Expiration Date: 201910062359
ISSUE DATE / TIME: 201910041245
UNIT TYPE AND RH: 6200

## 2018-01-01 LAB — CBC WITH DIFFERENTIAL (CANCER CENTER ONLY)
BASOS ABS: 0 10*3/uL (ref 0.0–0.1)
BASOS PCT: 0 %
EOS ABS: 0 10*3/uL (ref 0.0–0.5)
Eosinophils Relative: 0 %
HCT: 20.5 % — ABNORMAL LOW (ref 34.8–46.6)
Hemoglobin: 6.5 g/dL — CL (ref 11.6–15.9)
LYMPHS PCT: 31 %
Lymphs Abs: 0.1 10*3/uL — ABNORMAL LOW (ref 0.9–3.3)
MCH: 27.9 pg (ref 26.0–34.0)
MCHC: 31.7 g/dL — ABNORMAL LOW (ref 32.0–36.0)
MCV: 88 fL (ref 81.0–101.0)
MONO ABS: 0 10*3/uL — AB (ref 0.1–0.9)
Monocytes Relative: 13 %
Neutro Abs: 0.1 10*3/uL — CL (ref 1.5–6.5)
Neutrophils Relative %: 56 %
PLATELETS: 11 10*3/uL — AB (ref 145–400)
RBC: 2.33 MIL/uL — AB (ref 3.70–5.32)
RDW: 15.8 % — AB (ref 11.1–15.7)
WBC Count: 0.2 10*3/uL — CL (ref 3.9–10.0)

## 2018-01-01 LAB — CMP (CANCER CENTER ONLY)
ALT: 32 U/L (ref 10–47)
AST: 24 U/L (ref 11–38)
Albumin: 2.8 g/dL — ABNORMAL LOW (ref 3.5–5.0)
Alkaline Phosphatase: 64 U/L (ref 26–84)
Anion gap: 0 — ABNORMAL LOW (ref 5–15)
BUN: 26 mg/dL — AB (ref 7–22)
CHLORIDE: 116 mmol/L — AB (ref 98–108)
CO2: 21 mmol/L (ref 18–33)
Calcium: 8.1 mg/dL (ref 8.0–10.3)
Creatinine: 1.6 mg/dL — ABNORMAL HIGH (ref 0.60–1.20)
GLUCOSE: 136 mg/dL — AB (ref 73–118)
POTASSIUM: 2.9 mmol/L — AB (ref 3.3–4.7)
SODIUM: 137 mmol/L (ref 128–145)
Total Bilirubin: 0.5 mg/dL (ref 0.2–1.6)
Total Protein: 5.3 g/dL — ABNORMAL LOW (ref 6.4–8.1)

## 2018-01-01 LAB — SAMPLE TO BLOOD BANK

## 2018-01-01 NOTE — Telephone Encounter (Addendum)
Critical Value WBC 0.2 Hgb 6.5 ANC 0.1 Potassium 2.9 Dr Marin Olp notified. No orders at this time.

## 2018-01-01 NOTE — Progress Notes (Signed)
Hematology and Oncology Follow Up Visit  Jeanne Martinez [id number] Jun 17, 1950 68 y.o. 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  She had a third cycle of VD-PACE a couple weeks ago.  She has always tolerated this incredibly well.  She was seen down at [place] at the [facility] for protocol consideration.  Unfortunately, the CAR-T therapy protocol was not open to her.  However, Dr. Philipp Ovens had other options for her.  He is try to get some information from her bone marrow up in [place] that was done before she came down to [place].  She did have some platelets.  This was last Friday.  She is eating well.  She is lost a little bit of weight.  She is had no fever.  She has had no nausea or vomiting.  There is been no mouth sores.  She is had no change in bowel or bladder habits.  We are awaiting the Kappa light chain results.  I would like to think that there will be much better as her renal function is much better.    Overall, her performance status is ECOG 1.  Medications:  Allergies as of 01/01/2018   No Known Allergies     Medication List        Accurate as of 01/01/18  2:15 PM. Always use your most recent med list.          antiseptic oral rinse Liqd 15 mLs by Mouth Rinse route every 4 (four) hours.   ciprofloxacin 500 MG tablet Commonly known as:  CIPRO Take 1 tablet (500 mg total) by  mouth daily with breakfast.   famciclovir 250 MG tablet Commonly known as:  FAMVIR TAKE 1 TABLET BY MOUTH EVERY DAY   fluconazole 100 MG tablet Commonly known as:  DIFLUCAN Take 1 tablet (100 mg total) by mouth daily.   lidocaine-prilocaine cream Commonly known as:  EMLA Apply 1 application topically as needed. Apply to port one hour before appointment   LORazepam 0.5 MG tablet Commonly known as:  ATIVAN Take 1 tablet (0.5 mg total) by mouth every 6 (six) hours as needed (Nausea or vomiting).   multivitamin with minerals Tabs tablet Take 1 tablet by mouth daily.   ondansetron 8 MG tablet Commonly known as:  ZOFRAN Take 1 tablet (8 mg total) by mouth 2 (two) times daily as needed for refractory nausea / vomiting. Start on the second day after inpatient chemo completed.   prochlorperazine 10 MG tablet Commonly known as:  COMPAZINE Take 1 tablet (10 mg total) by mouth every 6 (six) hours as needed (Nausea or vomiting).   sodium bicarbonate/sodium chloride Soln 1 application by Mouth Rinse route every 4 (four) hours.       Allergies:  No Known Allergies  Past Medical History, Surgical history, Social history, and Family History were reviewed and updated.  Review of Systems: Review of Systems  Constitutional: Positive for weight loss.  HENT: Negative.   Eyes: Negative.   Respiratory: Negative.   Cardiovascular: Negative.   Gastrointestinal: Negative.   Genitourinary: Negative.   Musculoskeletal: Negative.   Skin: Negative.   Neurological: Negative.   Endo/Heme/Allergies: Negative.   Psychiatric/Behavioral: Negative.    Marland Kitchen   Physical Exam:  weight is 93 lb 12 oz (42.5 kg). Her oral temperature is 98.2 F (36.8 C). Her blood pressure is 119/69 and her pulse is 89. Her respiration is 18 and oxygen saturation is 100%.   Wt Readings from Last 3 Encounters:  01/01/18 93 lb 12 oz (42.5 kg)  12/24/17 113 lb 5.1 oz (51.4 kg)  12/18/17 95 lb 12 oz (43.4 kg)     Physical Exam  Constitutional: She is oriented to person, place, and time.  HENT:  Head: Normocephalic and atraumatic.  Mouth/Throat: Oropharynx is clear and moist.  Eyes: Pupils are equal, round, and reactive to light. EOM are normal.  Neck: Normal range of motion.  Cardiovascular: Normal rate, regular rhythm and normal heart sounds.  Pulmonary/Chest: Effort normal and breath sounds normal.  Abdominal: Soft. Bowel sounds are normal.  Musculoskeletal: Normal range of motion. She exhibits no edema, tenderness or deformity.  Lymphadenopathy:    She has no cervical adenopathy.  Neurological: She is alert and oriented to person, place, and time.  Skin: Skin is warm and dry. No rash noted. No erythema.  Psychiatric: She has a normal mood and affect. Her behavior is normal. Judgment and thought content normal.  Vitals reviewed.    Lab Results  Component Value Date   WBC 0.2 (LL) 01/01/2018   HGB 6.5 (LL) 01/01/2018   HCT 20.5 (L) 01/01/2018   MCV 88.0 01/01/2018   PLT 11 (L) 01/01/2018   Lab Results  Component Value Date   FERRITIN 1,608 (H) 08/23/2017   IRON 98 08/23/2017   TIBC 210 (L) 08/23/2017   UIBC 112 08/23/2017   IRONPCTSAT 47 08/23/2017   Lab Results  Component Value Date   RBC 2.33 (L) 01/01/2018   Lab Results  Component Value Date   KPAFRELGTCHN 11,668.6 (H) 12/18/2017   LAMBDASER <1.5 (L) 12/18/2017   KAPLAMBRATIO UNABLE TO CALCULATE 12/18/2017   Lab Results  Component Value Date   IGGSERUM 177 (L) 12/18/2017   IGGSERUM 168 (L) 12/18/2017   IGA <5 (L) 12/18/2017   IGA <5 (L) 12/18/2017   IGMSERUM <5 (L) 12/18/2017   IGMSERUM <5 (L) 12/18/2017   Lab Results  Component Value Date   TOTALPROTELP 5.0 (L) 12/18/2017   ALBUMINELP 2.9 12/18/2017   A1GS 0.3 12/18/2017   A2GS 0.8 12/18/2017   BETS 0.7 12/18/2017   GAMS 0.3 (L) 12/18/2017   MSPIKE 0.2 (H) 12/18/2017   SPEI Comment 10/06/2017     Chemistry      Component Value Date/Time   NA 137  01/01/2018 1115   NA 148 (H) 03/17/2017 1210   K 2.9 (LL) 01/01/2018 1115   K 4.4 03/17/2017 1210   CL 116 (H) 01/01/2018 1115   CL 106 03/17/2017 1210   CO2 21 01/01/2018 1115   CO2 27 03/17/2017 1210   BUN 26 (H) 01/01/2018 1115   BUN 23 (H) 03/17/2017 1210   CREATININE 1.60 (H) 01/01/2018 1115   CREATININE 1.1 03/17/2017 1210      Component Value Date/Time   CALCIUM 8.1 01/01/2018 1115   CALCIUM 9.1 03/17/2017 1210   ALKPHOS 64 01/01/2018 1115   ALKPHOS 52 03/17/2017 1210   AST 24 01/01/2018 1115  ALT 32 01/01/2018 1115   ALT 21 03/17/2017 1210   BILITOT 0.5 01/01/2018 1115      Impression and Plan: Jeanne Martinez is a very pleasant 68 yo caucasian female with IgA kappa myeloma with 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   The real key now is whether or not she will be able to be on protocol.  She goes back down to [place] on October 21.  We will have to monitor her blood counts 3 times a week as we have been doing.  It would not surprise me if she needed platelets on Wednesday.  We will see what the light chain level is.  As always, I am just incredibly impressed as to how resilient she is.  Hopefully, she will still have responsive myeloma that we can treat.    Volanda Napoleon, MD 10/7/20192:15 PM

## 2018-01-01 NOTE — Patient Instructions (Signed)
Implanted Port Home Guide An implanted port is a type of central line that is placed under the skin. Central lines are used to provide IV access when treatment or nutrition needs to be given through a person's veins. Implanted ports are used for long-term IV access. An implanted port may be placed because:  You need IV medicine that would be irritating to the small veins in your hands or arms.  You need long-term IV medicines, such as antibiotics.  You need IV nutrition for a long period.  You need frequent blood draws for lab tests.  You need dialysis.  Implanted ports are usually placed in the chest area, but they can also be placed in the upper arm, the abdomen, or the leg. An implanted port has two main parts:  Reservoir. The reservoir is round and will appear as a small, raised area under your skin. The reservoir is the part where a needle is inserted to give medicines or draw blood.  Catheter. The catheter is a thin, flexible tube that extends from the reservoir. The catheter is placed into a large vein. Medicine that is inserted into the reservoir goes into the catheter and then into the vein.  How will I care for my incision site? Do not get the incision site wet. Bathe or shower as directed by your health care provider. How is my port accessed? Special steps must be taken to access the port:  Before the port is accessed, a numbing cream can be placed on the skin. This helps numb the skin over the port site.  Your health care provider uses a sterile technique to access the port. ? Your health care provider must put on a mask and sterile gloves. ? The skin over your port is cleaned carefully with an antiseptic and allowed to dry. ? The port is gently pinched between sterile gloves, and a needle is inserted into the port.  Only "non-coring" port needles should be used to access the port. Once the port is accessed, a blood return should be checked. This helps ensure that the port  is in the vein and is not clogged.  If your port needs to remain accessed for a constant infusion, a clear (transparent) bandage will be placed over the needle site. The bandage and needle will need to be changed every week, or as directed by your health care provider.  Keep the bandage covering the needle clean and dry. Do not get it wet. Follow your health care provider's instructions on how to take a shower or bath while the port is accessed.  If your port does not need to stay accessed, no bandage is needed over the port.  What is flushing? Flushing helps keep the port from getting clogged. Follow your health care provider's instructions on how and when to flush the port. Ports are usually flushed with saline solution or a medicine called heparin. The need for flushing will depend on how the port is used.  If the port is used for intermittent medicines or blood draws, the port will need to be flushed: ? After medicines have been given. ? After blood has been drawn. ? As part of routine maintenance.  If a constant infusion is running, the port may not need to be flushed.  How long will my port stay implanted? The port can stay in for as long as your health care provider thinks it is needed. When it is time for the port to come out, surgery will be   done to remove it. The procedure is similar to the one performed when the port was put in. When should I seek immediate medical care? When you have an implanted port, you should seek immediate medical care if:  You notice a bad smell coming from the incision site.  You have swelling, redness, or drainage at the incision site.  You have more swelling or pain at the port site or the surrounding area.  You have a fever that is not controlled with medicine.  This information is not intended to replace advice given to you by your health care provider. Make sure you discuss any questions you have with your health care provider. Document  Released: 03/14/2005 Document Revised: 08/20/2015 Document Reviewed: 11/19/2012 Elsevier Interactive Patient Education  2017 Elsevier Inc.  

## 2018-01-02 LAB — KAPPA/LAMBDA LIGHT CHAINS
KAPPA FREE LGHT CHN: 1507.9 mg/L — AB (ref 3.3–19.4)
Lambda free light chains: <1.5 mg/L — ABNORMAL LOW (ref 5.7–26.3)

## 2018-01-02 LAB — IGG, IGA, IGM
IGM (IMMUNOGLOBULIN M), SRM: 12 mg/dL — AB (ref 26–217)
IgA: 20 mg/dL — ABNORMAL LOW (ref 87–352)
IgG (Immunoglobin G), Serum: 252 mg/dL — ABNORMAL LOW (ref 700–1600)

## 2018-01-03 ENCOUNTER — Other Ambulatory Visit: Payer: Self-pay

## 2018-01-03 ENCOUNTER — Telehealth: Payer: Self-pay

## 2018-01-03 ENCOUNTER — Other Ambulatory Visit: Payer: Self-pay | Admitting: Family

## 2018-01-03 ENCOUNTER — Inpatient Hospital Stay: Payer: Medicare Other

## 2018-01-03 ENCOUNTER — Encounter: Payer: Self-pay | Admitting: Hematology & Oncology

## 2018-01-03 VITALS — BP 128/69 | HR 83 | Temp 98.1°F | Resp 17

## 2018-01-03 DIAGNOSIS — C9002 Multiple myeloma in relapse: Secondary | ICD-10-CM | POA: Diagnosis not present

## 2018-01-03 DIAGNOSIS — D696 Thrombocytopenia, unspecified: Secondary | ICD-10-CM

## 2018-01-03 DIAGNOSIS — D649 Anemia, unspecified: Secondary | ICD-10-CM

## 2018-01-03 DIAGNOSIS — C9000 Multiple myeloma not having achieved remission: Secondary | ICD-10-CM

## 2018-01-03 DIAGNOSIS — Z95828 Presence of other vascular implants and grafts: Secondary | ICD-10-CM

## 2018-01-03 LAB — CMP (CANCER CENTER ONLY)
ALBUMIN: 2.8 g/dL — AB (ref 3.5–5.0)
ALK PHOS: 60 U/L (ref 26–84)
ALT: 18 U/L (ref 10–47)
AST: 23 U/L (ref 11–38)
Anion gap: 4 — ABNORMAL LOW (ref 5–15)
BUN: 23 mg/dL — AB (ref 7–22)
CO2: 21 mmol/L (ref 18–33)
CREATININE: 1.7 mg/dL — AB (ref 0.60–1.20)
Calcium: 8 mg/dL (ref 8.0–10.3)
Chloride: 114 mmol/L — ABNORMAL HIGH (ref 98–108)
GLUCOSE: 107 mg/dL (ref 73–118)
POTASSIUM: 3.1 mmol/L — AB (ref 3.3–4.7)
SODIUM: 139 mmol/L (ref 128–145)
Total Bilirubin: 0.5 mg/dL (ref 0.2–1.6)
Total Protein: 5.3 g/dL — ABNORMAL LOW (ref 6.4–8.1)

## 2018-01-03 LAB — CBC WITH DIFFERENTIAL (CANCER CENTER ONLY)
Abs Immature Granulocytes: 0 10*3/uL (ref 0.00–0.07)
BASOS ABS: 0 10*3/uL (ref 0.0–0.1)
BASOS PCT: 0 %
Eosinophils Absolute: 0 10*3/uL (ref 0.0–0.5)
Eosinophils Relative: 2 %
HCT: 18.3 % — ABNORMAL LOW (ref 36.0–46.0)
HEMOGLOBIN: 5.8 g/dL — AB (ref 12.0–15.0)
Immature Granulocytes: 0 %
LYMPHS ABS: 0 10*3/uL — AB (ref 0.7–4.0)
LYMPHS PCT: 8 %
MCH: 27.5 pg (ref 26.0–34.0)
MCHC: 31.7 g/dL (ref 30.0–36.0)
MCV: 86.7 fL (ref 80.0–100.0)
MONO ABS: 0.1 10*3/uL (ref 0.1–1.0)
Monocytes Relative: 12 %
Neutro Abs: 0.4 10*3/uL — CL (ref 1.7–7.7)
Neutrophils Relative %: 78 %
PLATELETS: 8 10*3/uL — AB (ref 150–400)
RBC: 2.11 MIL/uL — AB (ref 3.87–5.11)
RDW: 15.7 % — ABNORMAL HIGH (ref 11.5–15.5)
WBC Count: 0.5 10*3/uL — CL (ref 4.0–10.5)
nRBC: 0 % (ref 0.0–0.2)

## 2018-01-03 LAB — PREPARE RBC (CROSSMATCH)

## 2018-01-03 MED ORDER — HEPARIN SOD (PORK) LOCK FLUSH 100 UNIT/ML IV SOLN
500.0000 [IU] | Freq: Once | INTRAVENOUS | Status: AC
  Administered 2018-01-03: 500 [IU] via INTRAVENOUS
  Filled 2018-01-03: qty 5

## 2018-01-03 MED ORDER — SODIUM CHLORIDE 0.9% FLUSH
10.0000 mL | INTRAVENOUS | Status: DC | PRN
  Administered 2018-01-03: 10 mL via INTRAVENOUS
  Filled 2018-01-03: qty 10

## 2018-01-03 NOTE — Telephone Encounter (Signed)
Dr Marin Olp aware of panic low WBC, ANC, Hgb & platelets. Pt to receive 2 units PRBC and 1 unit platelets tomorrow. Pt and spouse aware of appt time. dph

## 2018-01-04 ENCOUNTER — Inpatient Hospital Stay: Payer: Medicare Other

## 2018-01-04 ENCOUNTER — Other Ambulatory Visit: Payer: Self-pay

## 2018-01-04 DIAGNOSIS — D696 Thrombocytopenia, unspecified: Secondary | ICD-10-CM

## 2018-01-04 DIAGNOSIS — C9000 Multiple myeloma not having achieved remission: Secondary | ICD-10-CM

## 2018-01-04 DIAGNOSIS — D649 Anemia, unspecified: Secondary | ICD-10-CM

## 2018-01-04 DIAGNOSIS — C9002 Multiple myeloma in relapse: Secondary | ICD-10-CM | POA: Diagnosis not present

## 2018-01-04 MED ORDER — ACETAMINOPHEN 325 MG PO TABS
650.0000 mg | ORAL_TABLET | Freq: Once | ORAL | Status: AC
  Administered 2018-01-04: 650 mg via ORAL

## 2018-01-04 MED ORDER — DIPHENHYDRAMINE HCL 25 MG PO CAPS
ORAL_CAPSULE | ORAL | Status: AC
  Filled 2018-01-04: qty 1

## 2018-01-04 MED ORDER — SODIUM CHLORIDE 0.9% IV SOLUTION
250.0000 mL | Freq: Once | INTRAVENOUS | Status: AC
  Administered 2018-01-04: 250 mL via INTRAVENOUS
  Filled 2018-01-04: qty 250

## 2018-01-04 MED ORDER — HEPARIN SOD (PORK) LOCK FLUSH 100 UNIT/ML IV SOLN
500.0000 [IU] | Freq: Every day | INTRAVENOUS | Status: AC | PRN
  Administered 2018-01-04: 500 [IU]
  Filled 2018-01-04: qty 5

## 2018-01-04 MED ORDER — SODIUM CHLORIDE 0.9% FLUSH
10.0000 mL | INTRAVENOUS | Status: AC | PRN
  Administered 2018-01-04: 10 mL
  Filled 2018-01-04: qty 10

## 2018-01-04 MED ORDER — DIPHENHYDRAMINE HCL 25 MG PO CAPS
25.0000 mg | ORAL_CAPSULE | Freq: Once | ORAL | Status: AC
  Administered 2018-01-04: 25 mg via ORAL

## 2018-01-04 MED ORDER — ACETAMINOPHEN 325 MG PO TABS
ORAL_TABLET | ORAL | Status: AC
  Filled 2018-01-04: qty 2

## 2018-01-04 NOTE — Patient Instructions (Addendum)
Blood Transfusion, Adult A blood transfusion is a procedure in which you receive donated blood, including plasma, platelets, and red blood cells, through an IV tube. You may need a blood transfusion because of illness, surgery, or injury. The blood may come from a donor. You may also be able to donate blood for yourself (autologous blood donation) before a surgery if you know that you might require a blood transfusion. The blood given in a transfusion is made up of different types of cells. You may receive:  Red blood cells. These carry oxygen to the cells in the body.  White blood cells. These help you fight infections.  Platelets. These help your blood to clot.  Plasma. This is the liquid part of your blood and it helps with fluid imbalances.  If you have hemophilia or another clotting disorder, you may also receive other types of blood products. Tell a health care provider about:  Any allergies you have.  All medicines you are taking, including vitamins, herbs, eye drops, creams, and over-the-counter medicines.  Any problems you or family members have had with anesthetic medicines.  Any blood disorders you have.  Any surgeries you have had.  Any medical conditions you have, including any recent fever or cold symptoms.  Whether you are pregnant or may be pregnant.  Any previous reactions you have had during a blood transfusion. What are the risks? Generally, this is a safe procedure. However, problems may occur, including:  Having an allergic reaction to something in the donated blood. Hives and itching may be symptoms of this type of reaction.  Fever. This may be a reaction to the white blood cells in the transfused blood. Nausea or chest pain may accompany a fever.  Iron overload. This can happen from having many transfusions.  Transfusion-related acute lung injury (TRALI). This is a rare reaction that causes lung damage. The cause is not known.TRALI can occur within hours  of a transfusion or several days later.  Sudden (acute) or delayed hemolytic reactions. This happens if your blood does not match the cells in your transfusion. Your body's defense system (immune system) may try to attack the new cells. This complication is rare. The symptoms include fever, chills, nausea, and low back pain or chest pain.  Infection or disease transmission. This is rare.  What happens before the procedure?  You will have a blood test to determine your blood type. This is necessary to know what kind of blood your body will accept and to match it to the donor blood.  If you are going to have a planned surgery, you may be able to do an autologous blood donation. This may be done in case you need to have a transfusion.  If you have had an allergic reaction to a transfusion in the past, you may be given medicine to help prevent a reaction. This medicine may be given to you by mouth or through an IV tube.  You will have your temperature, blood pressure, and pulse monitored before the transfusion.  Follow instructions from your health care provider about eating and drinking restrictions.  Ask your health care provider about: ? Changing or stopping your regular medicines. This is especially important if you are taking diabetes medicines or blood thinners. ? Taking medicines such as aspirin and ibuprofen. These medicines can thin your blood. Do not take these medicines before your procedure if your health care provider instructs you not to. What happens during the procedure?  An IV tube will   be inserted into one of your veins.  The bag of donated blood will be attached to your IV tube. The blood will then enter through your vein.  Your temperature, blood pressure, and pulse will be monitored regularly during the transfusion. This monitoring is done to detect early signs of a transfusion reaction.  If you have any signs or symptoms of a reaction, your transfusion will be stopped  and you may be given medicine.  When the transfusion is complete, your IV tube will be removed.  Pressure may be applied to the IV site for a few minutes.  A bandage (dressing) will be applied. The procedure may vary among health care providers and hospitals. What happens after the procedure?  Your temperature, blood pressure, heart rate, breathing rate, and blood oxygen level will be monitored often.  Your blood may be tested to see how you are responding to the transfusion.  You may be warmed with fluids or blankets to maintain a normal body temperature. Summary  A blood transfusion is a procedure in which you receive donated blood, including plasma, platelets, and red blood cells, through an IV tube.  Your temperature, blood pressure, and pulse will be monitored before, during, and after the transfusion.  Your blood may be tested after the transfusion to see how your body has responded. This information is not intended to replace advice given to you by your health care provider. Make sure you discuss any questions you have with your health care provider. Document Released: 03/11/2000 Document Revised: 12/10/2015 Document Reviewed: 12/10/2015 Elsevier Interactive Patient Education  2018 [employer].  Platelet Transfusion A platelet transfusion is a procedure in which you receive donated platelets through an IV tube. Platelets are tiny pieces of blood cells. When a blood vessel is damaged, platelets collect in the damaged area to help form a blood clot. This begins the healing process. If your platelet count gets too low, your blood may have trouble clotting. You may need a platelet transfusion if you have a condition that causes a low number of platelets (thrombocytopenia). A platelet transfusion may be used to stop or prevent bleeding. Tell a health care provider about:  Any allergies you have.  All medicines you are taking, including vitamins, herbs, eye drops, creams, and  over-the-counter medicines.  Any problems you or family members have had with anesthetic medicines.  Any blood disorders you have.  Any surgeries you have had.  Any medical conditions you have.  Any reactions you have had during a previous transfusion. What are the risks? Generally, this is a safe procedure. However, problems may occur, including:  Fever with or without chills. The fever usually occurs within the first 4 hours of the transfusion and returns to normal within 48 hours.  Allergic reaction. The reaction is most commonly caused by antibodies your body creates against substances in the transfusion. Signs of an allergic reaction may include itching, hives, difficulty breathing, shock, or low blood pressure.  Sudden (acute) or delayed hemolytic reaction. This rare reaction can occur during the transfusion and up to 28 days after the transfusion. The reaction usually occurs when your body's defense system (immune system) attacks the new platelets. Signs of a hemolytic reaction may include fever, headache, difficulty breathing, low blood pressure, a rapid heartbeat, or pain in your back, abdomen, chest, or IV site.  Transfusion-related acute lung injury (TRALI). TRALI can occur within hours of a transfusion, or several days later. This is a rare reaction that causes lung damage.  The cause is not known.  Infection. Signs of this rare complication may include fever, chills, vomiting, a rapid heartbeat, or low blood pressure.  What happens before the procedure?  You may have a blood test to determine your blood type. This is necessary to find out what kind ofplatelets best matches your platelets.  If you have had an allergic reaction to a transfusion in the past, you may be given medicine to help prevent a reaction. Take this medicine only as directed by your health care provider.  Your temperature, blood pressure, and pulse will be monitored before the transfusion. What happens  during the procedure?  An IV will be started in your hand or arm.  The transfusion will be attached to your IV tubing. The bag of donated platelets will be attached to your IV tube andgiven into your vein.  Your temperature, blood pressure, and pulse will be monitored regularly during the transfusion. This monitoring is done to help detect early signs of a transfusion reaction.  If you have any signs or symptoms of a reaction, your transfusion will be stopped and you may be given medicine.  When your transfusion is complete, your IV will be removed.  Pressure may be applied to the IV site for a few minutes.  A bandage (dressing) will be applied. The procedure may vary among health care providers and hospitals. What happens after the procedure?  Your blood pressure, temperature, and pulse will be monitored regularly. This information is not intended to replace advice given to you by your health care provider. Make sure you discuss any questions you have with your health care provider. Document Released: 01/09/2007 Document Revised: 08/20/2015 Document Reviewed: 01/22/2014 Elsevier Interactive Patient Education  2018 [employer].

## 2018-01-05 ENCOUNTER — Inpatient Hospital Stay: Payer: Medicare Other

## 2018-01-05 ENCOUNTER — Other Ambulatory Visit: Payer: Self-pay | Admitting: *Deleted

## 2018-01-05 ENCOUNTER — Other Ambulatory Visit: Payer: Self-pay

## 2018-01-05 ENCOUNTER — Telehealth: Payer: Self-pay | Admitting: *Deleted

## 2018-01-05 VITALS — BP 123/68 | HR 73 | Temp 97.9°F | Resp 17

## 2018-01-05 DIAGNOSIS — C9000 Multiple myeloma not having achieved remission: Secondary | ICD-10-CM

## 2018-01-05 DIAGNOSIS — Z95828 Presence of other vascular implants and grafts: Secondary | ICD-10-CM

## 2018-01-05 DIAGNOSIS — C9002 Multiple myeloma in relapse: Secondary | ICD-10-CM | POA: Diagnosis not present

## 2018-01-05 LAB — PREPARE PLATELET PHERESIS: Unit division: 0

## 2018-01-05 LAB — CBC WITH DIFFERENTIAL (CANCER CENTER ONLY)
Abs Immature Granulocytes: 0.08 10*3/uL — ABNORMAL HIGH (ref 0.00–0.07)
Basophils Absolute: 0 10*3/uL (ref 0.0–0.1)
Basophils Relative: 1 %
EOS ABS: 0 10*3/uL (ref 0.0–0.5)
EOS PCT: 0 %
HCT: 26.7 % — ABNORMAL LOW (ref 36.0–46.0)
Hemoglobin: 8.5 g/dL — ABNORMAL LOW (ref 12.0–15.0)
Immature Granulocytes: 6 %
Lymphocytes Relative: 6 %
Lymphs Abs: 0.1 10*3/uL — ABNORMAL LOW (ref 0.7–4.0)
MCH: 28 pg (ref 26.0–34.0)
MCHC: 31.8 g/dL (ref 30.0–36.0)
MCV: 87.8 fL (ref 80.0–100.0)
Monocytes Absolute: 0.2 10*3/uL (ref 0.1–1.0)
Monocytes Relative: 13 %
Neutro Abs: 0.9 10*3/uL — ABNORMAL LOW (ref 1.7–7.7)
Neutrophils Relative %: 74 %
Platelet Count: 32 10*3/uL — ABNORMAL LOW (ref 150–400)
RBC: 3.04 MIL/uL — AB (ref 3.87–5.11)
RDW: 15.2 % (ref 11.5–15.5)
WBC: 1.3 10*3/uL — AB (ref 4.0–10.5)
nRBC: 0 % (ref 0.0–0.2)

## 2018-01-05 LAB — BPAM RBC
BLOOD PRODUCT EXPIRATION DATE: 201911182359
Blood Product Expiration Date: 201911142359
ISSUE DATE / TIME: 201910100800
ISSUE DATE / TIME: 201910100800
UNIT TYPE AND RH: 5100
UNIT TYPE AND RH: 5100

## 2018-01-05 LAB — TYPE AND SCREEN
ABO/RH(D): A POS
ANTIBODY SCREEN: NEGATIVE
UNIT DIVISION: 0
Unit division: 0

## 2018-01-05 LAB — CMP (CANCER CENTER ONLY)
ALT: 21 U/L (ref 10–47)
AST: 25 U/L (ref 11–38)
Albumin: 2.8 g/dL — ABNORMAL LOW (ref 3.5–5.0)
Alkaline Phosphatase: 65 U/L (ref 26–84)
Anion gap: 2 — ABNORMAL LOW (ref 5–15)
BILIRUBIN TOTAL: 0.5 mg/dL (ref 0.2–1.6)
BUN: 26 mg/dL — ABNORMAL HIGH (ref 7–22)
CHLORIDE: 113 mmol/L — AB (ref 98–108)
CO2: 23 mmol/L (ref 18–33)
CREATININE: 1.3 mg/dL — AB (ref 0.60–1.20)
Calcium: 7.6 mg/dL — ABNORMAL LOW (ref 8.0–10.3)
Glucose, Bld: 97 mg/dL (ref 73–118)
POTASSIUM: 2.8 mmol/L — AB (ref 3.3–4.7)
SODIUM: 138 mmol/L (ref 128–145)
Total Protein: 5.2 g/dL — ABNORMAL LOW (ref 6.4–8.1)

## 2018-01-05 LAB — BPAM PLATELET PHERESIS
Blood Product Expiration Date: 201910122359
ISSUE DATE / TIME: 201910100804
Unit Type and Rh: 6200

## 2018-01-05 LAB — SAMPLE TO BLOOD BANK

## 2018-01-05 MED ORDER — HEPARIN SOD (PORK) LOCK FLUSH 100 UNIT/ML IV SOLN
500.0000 [IU] | Freq: Once | INTRAVENOUS | Status: AC
  Administered 2018-01-05: 500 [IU] via INTRAVENOUS
  Filled 2018-01-05: qty 5

## 2018-01-05 MED ORDER — POTASSIUM CHLORIDE CRYS ER 20 MEQ PO TBCR: 40.0000 meq | EXTENDED_RELEASE_TABLET | Freq: Every day | ORAL | 2 refills | Status: AC

## 2018-01-05 MED ORDER — SODIUM CHLORIDE 0.9 % IJ SOLN
10.0000 mL | Freq: Once | INTRAMUSCULAR | Status: AC
  Administered 2018-01-05: 10 mL
  Filled 2018-01-05: qty 10

## 2018-01-05 NOTE — Patient Instructions (Signed)
Implanted Port Home Guide An implanted port is a type of central line that is placed under the skin. Central lines are used to provide IV access when treatment or nutrition needs to be given through a person's veins. Implanted ports are used for long-term IV access. An implanted port may be placed because:  You need IV medicine that would be irritating to the small veins in your hands or arms.  You need long-term IV medicines, such as antibiotics.  You need IV nutrition for a long period.  You need frequent blood draws for lab tests.  You need dialysis.  Implanted ports are usually placed in the chest area, but they can also be placed in the upper arm, the abdomen, or the leg. An implanted port has two main parts:  Reservoir. The reservoir is round and will appear as a small, raised area under your skin. The reservoir is the part where a needle is inserted to give medicines or draw blood.  Catheter. The catheter is a thin, flexible tube that extends from the reservoir. The catheter is placed into a large vein. Medicine that is inserted into the reservoir goes into the catheter and then into the vein.  How will I care for my incision site? Do not get the incision site wet. Bathe or shower as directed by your health care provider. How is my port accessed? Special steps must be taken to access the port:  Before the port is accessed, a numbing cream can be placed on the skin. This helps numb the skin over the port site.  Your health care provider uses a sterile technique to access the port. ? Your health care provider must put on a mask and sterile gloves. ? The skin over your port is cleaned carefully with an antiseptic and allowed to dry. ? The port is gently pinched between sterile gloves, and a needle is inserted into the port.  Only "non-coring" port needles should be used to access the port. Once the port is accessed, a blood return should be checked. This helps ensure that the port  is in the vein and is not clogged.  If your port needs to remain accessed for a constant infusion, a clear (transparent) bandage will be placed over the needle site. The bandage and needle will need to be changed every week, or as directed by your health care provider.  Keep the bandage covering the needle clean and dry. Do not get it wet. Follow your health care provider's instructions on how to take a shower or bath while the port is accessed.  If your port does not need to stay accessed, no bandage is needed over the port.  What is flushing? Flushing helps keep the port from getting clogged. Follow your health care provider's instructions on how and when to flush the port. Ports are usually flushed with saline solution or a medicine called heparin. The need for flushing will depend on how the port is used.  If the port is used for intermittent medicines or blood draws, the port will need to be flushed: ? After medicines have been given. ? After blood has been drawn. ? As part of routine maintenance.  If a constant infusion is running, the port may not need to be flushed.  How long will my port stay implanted? The port can stay in for as long as your health care provider thinks it is needed. When it is time for the port to come out, surgery will be   done to remove it. The procedure is similar to the one performed when the port was put in. When should I seek immediate medical care? When you have an implanted port, you should seek immediate medical care if:  You notice a bad smell coming from the incision site.  You have swelling, redness, or drainage at the incision site.  You have more swelling or pain at the port site or the surrounding area.  You have a fever that is not controlled with medicine.  This information is not intended to replace advice given to you by your health care provider. Make sure you discuss any questions you have with your health care provider. Document  Released: 03/14/2005 Document Revised: 08/20/2015 Document Reviewed: 11/19/2012 Elsevier Interactive Patient Education  2017 Elsevier Inc.  

## 2018-01-05 NOTE — Telephone Encounter (Addendum)
Patient's husband is aware of results and medication instructions. Confirmed with teach back. He states he has enough medication at home, but new prescription sent in case she runs out of medication.   ----- Message from Volanda Napoleon, MD sent at 01/05/2018  1:56 PM EDT ----- Call her son/dgtr -- her K+ is very low!!  She needs 24mEq daily!!!  Laurey Arrow

## 2018-01-05 NOTE — Telephone Encounter (Signed)
Critical Value Potassium 2.8 Dr Marin Olp notified. Orders placed

## 2018-01-08 ENCOUNTER — Other Ambulatory Visit: Payer: Self-pay | Admitting: *Deleted

## 2018-01-08 ENCOUNTER — Inpatient Hospital Stay: Payer: Medicare Other

## 2018-01-08 ENCOUNTER — Other Ambulatory Visit: Payer: Self-pay | Admitting: Family

## 2018-01-08 DIAGNOSIS — D696 Thrombocytopenia, unspecified: Secondary | ICD-10-CM

## 2018-01-08 DIAGNOSIS — C9000 Multiple myeloma not having achieved remission: Secondary | ICD-10-CM

## 2018-01-08 DIAGNOSIS — C9002 Multiple myeloma in relapse: Secondary | ICD-10-CM | POA: Diagnosis not present

## 2018-01-08 LAB — CMP (CANCER CENTER ONLY)
ALBUMIN: 2.8 g/dL — AB (ref 3.5–5.0)
ALT: 27 U/L (ref 10–47)
AST: 30 U/L (ref 11–38)
Alkaline Phosphatase: 71 U/L (ref 26–84)
Anion gap: 3 — ABNORMAL LOW (ref 5–15)
BUN: 20 mg/dL (ref 7–22)
CALCIUM: 8.1 mg/dL (ref 8.0–10.3)
CO2: 22 mmol/L (ref 18–33)
Chloride: 116 mmol/L — ABNORMAL HIGH (ref 98–108)
Creatinine: 1.4 mg/dL — ABNORMAL HIGH (ref 0.60–1.20)
GLUCOSE: 102 mg/dL (ref 73–118)
Potassium: 3.2 mmol/L — ABNORMAL LOW (ref 3.3–4.7)
Sodium: 141 mmol/L (ref 128–145)
Total Bilirubin: 0.5 mg/dL (ref 0.2–1.6)
Total Protein: 5.3 g/dL — ABNORMAL LOW (ref 6.4–8.1)

## 2018-01-08 LAB — SAMPLE TO BLOOD BANK

## 2018-01-08 MED ORDER — SODIUM CHLORIDE 0.9% IV SOLUTION
250.0000 mL | Freq: Once | INTRAVENOUS | Status: DC
  Filled 2018-01-08: qty 250

## 2018-01-08 MED ORDER — HEPARIN SOD (PORK) LOCK FLUSH 100 UNIT/ML IV SOLN
500.0000 [IU] | Freq: Every day | INTRAVENOUS | Status: AC | PRN
  Administered 2018-01-08: 500 [IU]
  Filled 2018-01-08: qty 5

## 2018-01-08 MED ORDER — SODIUM CHLORIDE 0.9% FLUSH
10.0000 mL | INTRAVENOUS | Status: AC | PRN
  Administered 2018-01-08: 10 mL
  Filled 2018-01-08: qty 10

## 2018-01-08 NOTE — Patient Instructions (Signed)
Implanted Port Insertion, Care After °This sheet gives you information about how to care for yourself after your procedure. Your health care provider may also give you more specific instructions. If you have problems or questions, contact your health care provider. °What can I expect after the procedure? °After your procedure, it is common to have: °· Discomfort at the port insertion site. °· Bruising on the skin over the port. This should improve over 3-4 days. ° °Follow these instructions at home: °Port care °· After your port is placed, you will get a manufacturer's information card. The card has information about your port. Keep this card with you at all times. °· Take care of the port as told by your health care provider. Ask your health care provider if you or a family member can get training for taking care of the port at home. A home health care nurse may also take care of the port. °· Make sure to remember what type of port you have. °Incision care °· Follow instructions from your health care provider about how to take care of your port insertion site. Make sure you: °? Wash your hands with soap and water before you change your bandage (dressing). If soap and water are not available, use hand sanitizer. °? Change your dressing as told by your health care provider. °? Leave stitches (sutures), skin glue, or adhesive strips in place. These skin closures may need to stay in place for 2 weeks or longer. If adhesive strip edges start to loosen and curl up, you may trim the loose edges. Do not remove adhesive strips completely unless your health care provider tells you to do that. °· Check your port insertion site every day for signs of infection. Check for: °? More redness, swelling, or pain. °? More fluid or blood. °? Warmth. °? Pus or a bad smell. °General instructions °· Do not take baths, swim, or use a hot tub until your health care provider approves. °· Do not lift anything that is heavier than 10 lb (4.5  kg) for a week, or as told by your health care provider. °· Ask your health care provider when it is okay to: °? Return to work or school. °? Resume usual physical activities or sports. °· Do not drive for 24 hours if you were given a medicine to help you relax (sedative). °· Take over-the-counter and prescription medicines only as told by your health care provider. °· Wear a medical alert bracelet in case of an emergency. This will tell any health care providers that you have a port. °· Keep all follow-up visits as told by your health care provider. This is important. °Contact a health care provider if: °· You cannot flush your port with saline as directed, or you cannot draw blood from the port. °· You have a fever or chills. °· You have more redness, swelling, or pain around your port insertion site. °· You have more fluid or blood coming from your port insertion site. °· Your port insertion site feels warm to the touch. °· You have pus or a bad smell coming from the port insertion site. °Get help right away if: °· You have chest pain or shortness of breath. °· You have bleeding from your port that you cannot control. °Summary °· Take care of the port as told by your health care provider. °· Change your dressing as told by your health care provider. °· Keep all follow-up visits as told by your health care provider. °  This information is not intended to replace advice given to you by your health care provider. Make sure you discuss any questions you have with your health care provider. °Document Released: 01/02/2013 Document Revised: 02/03/2016 Document Reviewed: 02/03/2016 °Elsevier Interactive Patient Education © 2017 [employer] ° °

## 2018-01-08 NOTE — Patient Instructions (Signed)
Platelet Transfusion, Care After °Refer to this sheet in the next few weeks. These instructions provide you with information about caring for yourself after your procedure. Your health care provider may also give you more specific instructions. Your treatment has been planned according to current medical practices, but problems sometimes occur. Call your health care provider if you have any problems or questions after your procedure. °What can I expect after the procedure? °After the procedure, it is common to have: °· Bruising and soreness at the IV site. °· Fever or chills within the first 48 hours of your transfusion. ° °Follow these instructions at home: °· Take medicines only as directed by your health care provider. Ask your health care provider if you can take an over-the-counter pain reliever in case you have a fever or headache a day or two after your transfusion. °· Return to your normal activities as directed by your health care provider. °Contact a health care provider if: °· You have a fever. °· You have a headache. °· You have redness, swelling, or pain at your IV site. °· You have skin itching or a rash. °· You vomit. °· You feel unusually tired or weak. °Get help right away if: °· You have trouble breathing. °· You have a decreased amount of urine or you urinate less often than you normally do. °· Your urine is darker than normal. °· You have pain in your back, abdomen, or chest. °· You have cool, clammy skin. °· You have a rapid heartbeat. °This information is not intended to replace advice given to you by your health care provider. Make sure you discuss any questions you have with your health care provider. °Document Released: 04/04/2014 Document Revised: 08/20/2015 Document Reviewed: 01/22/2014 °Elsevier Interactive Patient Education © 2018 [employer] ° °

## 2018-01-09 ENCOUNTER — Telehealth: Payer: Self-pay | Admitting: *Deleted

## 2018-01-09 LAB — PREPARE PLATELET PHERESIS: Unit division: 0

## 2018-01-09 LAB — BPAM PLATELET PHERESIS
Blood Product Expiration Date: 201910142359
ISSUE DATE / TIME: 201910141249
UNIT TYPE AND RH: 6200

## 2018-01-09 NOTE — Telephone Encounter (Signed)
Patient states she has had oozing from her port site since she left the office yesterday. She has a bandage to the site currently.  Appointment made for patient to come in for port assessment. Patient aware of time.

## 2018-01-09 NOTE — Telephone Encounter (Signed)
1341: Patient called back and said bleeding stopped. She doesn't want to come in. Has appointment in the am for blood work and we can assess it then.  Appointment cancelled.

## 2018-01-10 ENCOUNTER — Inpatient Hospital Stay: Payer: Medicare Other

## 2018-01-10 ENCOUNTER — Other Ambulatory Visit: Payer: Self-pay | Admitting: *Deleted

## 2018-01-10 ENCOUNTER — Other Ambulatory Visit: Payer: Self-pay | Admitting: Family

## 2018-01-10 ENCOUNTER — Telehealth: Payer: Self-pay | Admitting: *Deleted

## 2018-01-10 DIAGNOSIS — C9000 Multiple myeloma not having achieved remission: Secondary | ICD-10-CM

## 2018-01-10 DIAGNOSIS — C9002 Multiple myeloma in relapse: Secondary | ICD-10-CM | POA: Diagnosis not present

## 2018-01-10 DIAGNOSIS — D696 Thrombocytopenia, unspecified: Secondary | ICD-10-CM

## 2018-01-10 LAB — CBC WITH DIFFERENTIAL (CANCER CENTER ONLY)
ABS IMMATURE GRANULOCYTES: 0.17 10*3/uL — AB (ref 0.00–0.07)
BASOS ABS: 0 10*3/uL (ref 0.0–0.1)
Basophils Relative: 0 %
EOS PCT: 0 %
Eosinophils Absolute: 0 10*3/uL (ref 0.0–0.5)
HEMATOCRIT: 26.3 % — AB (ref 36.0–46.0)
Hemoglobin: 8.1 g/dL — ABNORMAL LOW (ref 12.0–15.0)
IMMATURE GRANULOCYTES: 3 %
LYMPHS ABS: 0.6 10*3/uL — AB (ref 0.7–4.0)
LYMPHS PCT: 9 %
MCH: 27.6 pg (ref 26.0–34.0)
MCHC: 30.8 g/dL (ref 30.0–36.0)
MCV: 89.5 fL (ref 80.0–100.0)
Monocytes Absolute: 0.7 10*3/uL (ref 0.1–1.0)
Monocytes Relative: 10 %
NEUTROS ABS: 4.9 10*3/uL (ref 1.7–7.7)
NRBC: 0 % (ref 0.0–0.2)
Neutrophils Relative %: 78 %
Platelet Count: 5 10*3/uL — CL (ref 150–400)
RBC: 2.94 MIL/uL — AB (ref 3.87–5.11)
RDW: 16.1 % — ABNORMAL HIGH (ref 11.5–15.5)
WBC: 6.3 10*3/uL (ref 4.0–10.5)

## 2018-01-10 LAB — CMP (CANCER CENTER ONLY)
ALBUMIN: 3 g/dL — AB (ref 3.5–5.0)
ALT: 25 U/L (ref 10–47)
ANION GAP: 2 — AB (ref 5–15)
AST: 26 U/L (ref 11–38)
Alkaline Phosphatase: 81 U/L (ref 26–84)
BUN: 19 mg/dL (ref 7–22)
CALCIUM: 8.3 mg/dL (ref 8.0–10.3)
CO2: 21 mmol/L (ref 18–33)
Chloride: 118 mmol/L — ABNORMAL HIGH (ref 98–108)
Creatinine: 1.4 mg/dL — ABNORMAL HIGH (ref 0.60–1.20)
GLUCOSE: 103 mg/dL (ref 73–118)
Potassium: 4.1 mmol/L (ref 3.3–4.7)
SODIUM: 141 mmol/L (ref 128–145)
TOTAL PROTEIN: 5.5 g/dL — AB (ref 6.4–8.1)
Total Bilirubin: 0.5 mg/dL (ref 0.2–1.6)

## 2018-01-10 LAB — SAMPLE TO BLOOD BANK

## 2018-01-10 NOTE — Telephone Encounter (Signed)
Critical Value Platelets 5 Dr Maylon Peppers notified. Orders placed

## 2018-01-10 NOTE — Patient Instructions (Addendum)
Implanted Port Home Guide An implanted port is a type of central line that is placed under the skin. Central lines are used to provide IV access when treatment or nutrition needs to be given through a person's veins. Implanted ports are used for long-term IV access. An implanted port may be placed because:  You need IV medicine that would be irritating to the small veins in your hands or arms.  You need long-term IV medicines, such as antibiotics.  You need IV nutrition for a long period.  You need frequent blood draws for lab tests.  You need dialysis.  Implanted ports are usually placed in the chest area, but they can also be placed in the upper arm, the abdomen, or the leg. An implanted port has two main parts:  Reservoir. The reservoir is round and will appear as a small, raised area under your skin. The reservoir is the part where a needle is inserted to give medicines or draw blood.  Catheter. The catheter is a thin, flexible tube that extends from the reservoir. The catheter is placed into a large vein. Medicine that is inserted into the reservoir goes into the catheter and then into the vein.  How will I care for my incision site? Do not get the incision site wet. Bathe or shower as directed by your health care provider. How is my port accessed? Special steps must be taken to access the port:  Before the port is accessed, a numbing cream can be placed on the skin. This helps numb the skin over the port site.  Your health care provider uses a sterile technique to access the port. ? Your health care provider must put on a mask and sterile gloves. ? The skin over your port is cleaned carefully with an antiseptic and allowed to dry. ? The port is gently pinched between sterile gloves, and a needle is inserted into the port.  Only "non-coring" port needles should be used to access the port. Once the port is accessed, a blood return should be checked. This helps ensure that the port  is in the vein and is not clogged.  If your port needs to remain accessed for a constant infusion, a clear (transparent) bandage will be placed over the needle site. The bandage and needle will need to be changed every week, or as directed by your health care provider.  Keep the bandage covering the needle clean and dry. Do not get it wet. Follow your health care provider's instructions on how to take a shower or bath while the port is accessed.  If your port does not need to stay accessed, no bandage is needed over the port.  What is flushing? Flushing helps keep the port from getting clogged. Follow your health care provider's instructions on how and when to flush the port. Ports are usually flushed with saline solution or a medicine called heparin. The need for flushing will depend on how the port is used.  If the port is used for intermittent medicines or blood draws, the port will need to be flushed: ? After medicines have been given. ? After blood has been drawn. ? As part of routine maintenance.  If a constant infusion is running, the port may not need to be flushed.  How long will my port stay implanted? The port can stay in for as long as your health care provider thinks it is needed. When it is time for the port to come out, surgery will be   done to remove it. The procedure is similar to the one performed when the port was put in. When should I seek immediate medical care? When you have an implanted port, you should seek immediate medical care if:  You notice a bad smell coming from the incision site.  You have swelling, redness, or drainage at the incision site.  You have more swelling or pain at the port site or the surrounding area.  You have a fever that is not controlled with medicine.  This information is not intended to replace advice given to you by your health care provider. Make sure you discuss any questions you have with your health care provider. Document  Released: 03/14/2005 Document Revised: 08/20/2015 Document Reviewed: 11/19/2012 Elsevier Interactive Patient Education  2017 [place]. Platelet Transfusion, Care After Refer to this sheet in the next few weeks. These instructions provide you with information about caring for yourself after your procedure. Your health care provider may also give you more specific instructions. Your treatment has been planned according to current medical practices, but problems sometimes occur. Call your health care provider if you have any problems or questions after your procedure. What can I expect after the procedure? After the procedure, it is common to have:  Bruising and soreness at the IV site.  Fever or chills within the first 48 hours of your transfusion.  Follow these instructions at home:  Take medicines only as directed by your health care provider. Ask your health care provider if you can take an over-the-counter pain reliever in case you have a fever or headache a day or two after your transfusion.  Return to your normal activities as directed by your health care provider. Contact a health care provider if:  You have a fever.  You have a headache.  You have redness, swelling, or pain at your IV site.  You have skin itching or a rash.  You vomit.  You feel unusually tired or weak. Get help right away if:  You have trouble breathing.  You have a decreased amount of urine or you urinate less often than you normally do.  Your urine is darker than normal.  You have pain in your back, abdomen, or chest.  You have cool, clammy skin.  You have a rapid heartbeat. This information is not intended to replace advice given to you by your health care provider. Make sure you discuss any questions you have with your health care provider. Document Released: 04/04/2014 Document Revised: 08/20/2015 Document Reviewed: 01/22/2014 Elsevier Interactive Patient Education  2018 [employer].

## 2018-01-11 LAB — BPAM PLATELET PHERESIS
Blood Product Expiration Date: 201910162359
ISSUE DATE / TIME: 201910161119
Unit Type and Rh: 5100

## 2018-01-11 LAB — PREPARE PLATELET PHERESIS: Unit division: 0

## 2018-01-12 ENCOUNTER — Other Ambulatory Visit: Payer: Self-pay

## 2018-01-12 ENCOUNTER — Inpatient Hospital Stay (HOSPITAL_BASED_OUTPATIENT_CLINIC_OR_DEPARTMENT_OTHER): Payer: Medicare Other | Admitting: Family

## 2018-01-12 ENCOUNTER — Inpatient Hospital Stay: Payer: Medicare Other

## 2018-01-12 ENCOUNTER — Encounter: Payer: Self-pay | Admitting: Hematology & Oncology

## 2018-01-12 ENCOUNTER — Telehealth: Payer: Self-pay

## 2018-01-12 ENCOUNTER — Encounter: Payer: Self-pay | Admitting: Family

## 2018-01-12 ENCOUNTER — Other Ambulatory Visit: Payer: Self-pay | Admitting: Family

## 2018-01-12 VITALS — BP 104/61 | HR 84 | Temp 97.7°F | Resp 17 | Wt 97.5 lb

## 2018-01-12 VITALS — BP 104/61 | HR 84 | Temp 97.7°F | Resp 17

## 2018-01-12 DIAGNOSIS — D696 Thrombocytopenia, unspecified: Secondary | ICD-10-CM

## 2018-01-12 DIAGNOSIS — N179 Acute kidney failure, unspecified: Secondary | ICD-10-CM | POA: Diagnosis not present

## 2018-01-12 DIAGNOSIS — C9000 Multiple myeloma not having achieved remission: Secondary | ICD-10-CM

## 2018-01-12 DIAGNOSIS — C9002 Multiple myeloma in relapse: Secondary | ICD-10-CM | POA: Diagnosis not present

## 2018-01-12 DIAGNOSIS — D649 Anemia, unspecified: Secondary | ICD-10-CM

## 2018-01-12 LAB — CMP (CANCER CENTER ONLY)
ALT: 30 U/L (ref 10–47)
AST: 27 U/L (ref 11–38)
Albumin: 2.8 g/dL — ABNORMAL LOW (ref 3.5–5.0)
Alkaline Phosphatase: 79 U/L (ref 26–84)
Anion gap: 1 — ABNORMAL LOW (ref 5–15)
BUN: 17 mg/dL (ref 7–22)
CO2: 22 mmol/L (ref 18–33)
Calcium: 8.8 mg/dL (ref 8.0–10.3)
Chloride: 117 mmol/L — ABNORMAL HIGH (ref 98–108)
Creatinine: 1.4 mg/dL — ABNORMAL HIGH (ref 0.60–1.20)
Glucose, Bld: 104 mg/dL (ref 73–118)
Potassium: 4 mmol/L (ref 3.3–4.7)
Sodium: 140 mmol/L (ref 128–145)
Total Bilirubin: 0.5 mg/dL (ref 0.2–1.6)
Total Protein: 5.3 g/dL — ABNORMAL LOW (ref 6.4–8.1)

## 2018-01-12 LAB — CBC WITH DIFFERENTIAL (CANCER CENTER ONLY)
ABS IMMATURE GRANULOCYTES: 0.14 10*3/uL — AB (ref 0.00–0.07)
BASOS ABS: 0 10*3/uL (ref 0.0–0.1)
Basophils Relative: 0 %
Eosinophils Absolute: 0 10*3/uL (ref 0.0–0.5)
Eosinophils Relative: 0 %
HCT: 23.2 % — ABNORMAL LOW (ref 36.0–46.0)
Hemoglobin: 7.2 g/dL — ABNORMAL LOW (ref 12.0–15.0)
IMMATURE GRANULOCYTES: 3 %
Lymphocytes Relative: 14 %
Lymphs Abs: 0.7 10*3/uL (ref 0.7–4.0)
MCH: 27.8 pg (ref 26.0–34.0)
MCHC: 31 g/dL (ref 30.0–36.0)
MCV: 89.6 fL (ref 80.0–100.0)
Monocytes Absolute: 0.6 10*3/uL (ref 0.1–1.0)
Monocytes Relative: 13 %
NEUTROS ABS: 3.5 10*3/uL (ref 1.7–7.7)
NEUTROS PCT: 70 %
NRBC: 0 % (ref 0.0–0.2)
PLATELETS: 13 10*3/uL — AB (ref 150–400)
RBC: 2.59 MIL/uL — AB (ref 3.87–5.11)
RDW: 16.4 % — AB (ref 11.5–15.5)
WBC: 4.9 10*3/uL (ref 4.0–10.5)

## 2018-01-12 LAB — SAMPLE TO BLOOD BANK

## 2018-01-12 MED ORDER — HEPARIN SOD (PORK) LOCK FLUSH 100 UNIT/ML IV SOLN
500.0000 [IU] | Freq: Once | INTRAVENOUS | Status: AC
  Administered 2018-01-12: 500 [IU] via INTRAVENOUS
  Filled 2018-01-12: qty 5

## 2018-01-12 MED ORDER — SODIUM CHLORIDE 0.9% FLUSH
10.0000 mL | INTRAVENOUS | Status: DC | PRN
  Administered 2018-01-12: 10 mL via INTRAVENOUS
  Filled 2018-01-12: qty 10

## 2018-01-12 NOTE — Telephone Encounter (Signed)
Per Judson Roch, NP contacted pt's spouse Jeanne Martinez re: low albumin. Per Judson Roch instructed pt to try drinking Boost or Ensure to increase albumin in hopes of improving edema. Jeanne Martinez verbalizes understanding and repeats back instructions. dph

## 2018-01-12 NOTE — Telephone Encounter (Signed)
Spoke with patient's spouse - he is aware of blood transfusion tomorrow - pt to receive 2 units of PLT per MD Ennever. Patient's VM on phone was full.

## 2018-01-12 NOTE — Progress Notes (Addendum)
Hematology and Oncology Follow Up Visit  Jeanne Martinez [id number] [birth year] 67 y.o. 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 She is doing well and has no complaints at this time. She received platelets on Wednesday and count today is 13 and Hgb stable 7.2.  She denies any episodes of bleeding. She has a few healing bruises. No petechiae.  She sees Dr. Philipp Ovens on Monday to see if she can take part in a clinical trial.  No fever, chills, n/v, cough, rash, dizziness, SOB, chest pain, palpitations, abdominal pain or changes in bowel or bladder habits.  No tenderness, numbness or tingling in Jeanne Martinez extremities at this time. She has new puffiness in both extremities. No redness or pitting edema at this time. Pedal pulses +2. No c/o pain. Negative Homan's sign.  No lymphadenopathy noted on exam.  She has maintained a good appetite and is staying well hydrated. Jeanne Martinez weight is up 4 lbs.   ECOG Performance Status: 1 - Symptomatic but completely ambulatory  Medications:  Allergies as of 01/12/2018   No Known Allergies     Medication List        Accurate as of 01/12/18  1:25 PM. Always use your most recent med list.          antiseptic oral rinse Liqd 15 mLs by Mouth Rinse route every 4 (four) hours.   ciprofloxacin 500 MG tablet Commonly known as:  CIPRO Take 1 tablet (500 mg total) by mouth daily with breakfast.   famciclovir 250  MG tablet Commonly known as:  FAMVIR TAKE 1 TABLET BY MOUTH EVERY DAY   fluconazole 100 MG tablet Commonly known as:  DIFLUCAN Take 1 tablet (100 mg total) by mouth daily.   lidocaine-prilocaine cream Commonly known as:  EMLA Apply 1 application topically as needed. Apply to port one hour before appointment   LORazepam 0.5 MG tablet Commonly known as:  ATIVAN Take 1 tablet (0.5 mg total) by mouth every 6 (six) hours as needed (Nausea or vomiting).   multivitamin with minerals Tabs tablet Take 1 tablet by mouth daily.   ondansetron 8 MG tablet Commonly known as:  ZOFRAN Take 1 tablet (8 mg total) by mouth 2 (two) times daily as needed for refractory nausea / vomiting. Start on the second day after inpatient chemo completed.   potassium chloride SA 20 MEQ tablet Commonly known as:  K-DUR,KLOR-CON Take 2 tablets (40 mEq total) by mouth daily.   prochlorperazine 10 MG tablet Commonly known as:  COMPAZINE Take 1 tablet (10 mg total) by mouth every 6 (six) hours as needed (Nausea or vomiting).   sodium bicarbonate/sodium chloride Soln 1 application by Mouth Rinse route every 4 (four) hours.       Allergies: No Known Allergies  Past Medical History, Surgical history, Social history, and Family History were reviewed and updated.  Review of Systems: All other 10 point review of systems is negative.   Physical Exam:  weight is  97 lb 8 oz (44.2 kg). Jeanne Martinez temperature is 97.7 F (36.5 C). Jeanne Martinez blood pressure is 104/61 and Jeanne Martinez pulse is 84. Jeanne Martinez respiration is 17 and oxygen saturation is 100%.   Wt Readings from Last 3 Encounters:  01/12/18 97 lb 8 oz (44.2 kg)  01/01/18 93 lb 12 oz (42.5 kg)  12/24/17 113 lb 5.1 oz (51.4 kg)    Ocular: Sclerae unicteric, pupils equal, round and reactive to light Ear-nose-throat: Oropharynx clear, dentition fair Lymphatic: No cervical, supraclavicular or axillary adenopathy Lungs no rales or rhonchi, good excursion bilaterally Heart regular  rate and rhythm, no murmur appreciated Abd soft, nontender, positive bowel sounds, no liver or spleen tip palpated on exam, no fluid wave  MSK no focal spinal tenderness, no joint edema Neuro: non-focal, well-oriented, appropriate affect Breasts: Deferred   Lab Results  Component Value Date   WBC 4.9 01/12/2018   HGB 7.2 (L) 01/12/2018   HCT 23.2 (L) 01/12/2018   MCV 89.6 01/12/2018   PLT 13 (L) 01/12/2018   Lab Results  Component Value Date   FERRITIN 1,608 (H) 08/23/2017   IRON 98 08/23/2017   TIBC 210 (L) 08/23/2017   UIBC 112 08/23/2017   IRONPCTSAT 47 08/23/2017   Lab Results  Component Value Date   RBC 2.59 (L) 01/12/2018   Lab Results  Component Value Date   KPAFRELGTCHN 1,507.9 (H) 01/01/2018   LAMBDASER <1.5 (L) 01/01/2018   KAPLAMBRATIO See below. 01/01/2018   Lab Results  Component Value Date   IGGSERUM 252 (L) 01/01/2018   IGA 20 (L) 01/01/2018   IGMSERUM 12 (L) 01/01/2018   Lab Results  Component Value Date   TOTALPROTELP 5.0 (L) 12/18/2017   ALBUMINELP 2.9 12/18/2017   A1GS 0.3 12/18/2017   A2GS 0.8 12/18/2017   BETS 0.7 12/18/2017   GAMS 0.3 (L) 12/18/2017   MSPIKE 0.2 (H) 12/18/2017   SPEI Comment 10/06/2017     Chemistry      Component Value Date/Time   NA 140 01/12/2018 1114   NA 148 (H) 03/17/2017 1210   K 4.0 01/12/2018 1114   K 4.4 03/17/2017 1210   CL 117 (H) 01/12/2018 1114   CL 106 03/17/2017 1210   CO2 22 01/12/2018 1114   CO2 27 03/17/2017 1210   BUN 17 01/12/2018 1114   BUN 23 (H) 03/17/2017 1210   CREATININE 1.40 (H) 01/12/2018 1114   CREATININE 1.1 03/17/2017 1210      Component Value Date/Time   CALCIUM 8.8 01/12/2018 1114   CALCIUM 9.1 03/17/2017 1210   ALKPHOS 79 01/12/2018 1114   ALKPHOS 52 03/17/2017 1210   AST 27 01/12/2018 1114   ALT 30 01/12/2018 1114   ALT 21 03/17/2017 1210   BILITOT 0.5 01/12/2018 1114       Impression and Plan: Jeanne Martinez is a pleasant 67 yo caucasian female with IgA kappa  myeloma with 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  We will give Jeanne Martinez 2 units of platelets tomorrow per Dr. Marin Olp.  We will continue to check labs mon, wed and fri and replace when indicated.  We will see what Dr. Philipp Ovens says on Monday and then go from there.  We will continue to monitor the puffiness in Jeanne Martinez lower extremities. She will increase Jeanne Martinez protein intake, total protein and albumin are low.   She will see Dr. Marin Olp for follow-up on Monday the 28th.  They will contact our office with any questions or concerns. We can certainly see Jeanne Martinez  sooner if need be.   Laverna Peace, NP 10/18/20191:25 PM

## 2018-01-13 ENCOUNTER — Inpatient Hospital Stay: Payer: Medicare Other

## 2018-01-13 VITALS — BP 96/54 | HR 88 | Temp 97.5°F | Resp 16

## 2018-01-13 DIAGNOSIS — C9000 Multiple myeloma not having achieved remission: Secondary | ICD-10-CM

## 2018-01-13 DIAGNOSIS — D696 Thrombocytopenia, unspecified: Secondary | ICD-10-CM

## 2018-01-13 DIAGNOSIS — C9002 Multiple myeloma in relapse: Secondary | ICD-10-CM | POA: Diagnosis not present

## 2018-01-13 LAB — IGG, IGA, IGM
IGA: 23 mg/dL — AB (ref 87–352)
IGG (IMMUNOGLOBIN G), SERUM: 289 mg/dL — AB (ref 700–1600)
IGM (IMMUNOGLOBULIN M), SRM: 17 mg/dL — AB (ref 26–217)

## 2018-01-13 MED ORDER — SODIUM CHLORIDE 0.9% FLUSH
10.0000 mL | INTRAVENOUS | Status: AC | PRN
  Administered 2018-01-13: 10 mL
  Filled 2018-01-13: qty 10

## 2018-01-13 MED ORDER — HEPARIN SOD (PORK) LOCK FLUSH 100 UNIT/ML IV SOLN
500.0000 [IU] | Freq: Every day | INTRAVENOUS | Status: AC | PRN
  Administered 2018-01-13: 500 [IU]
  Filled 2018-01-13: qty 5

## 2018-01-13 MED ORDER — DIPHENHYDRAMINE HCL 25 MG PO CAPS
25.0000 mg | ORAL_CAPSULE | Freq: Once | ORAL | Status: AC
  Administered 2018-01-13: 25 mg via ORAL

## 2018-01-13 MED ORDER — ACETAMINOPHEN 325 MG PO TABS
650.0000 mg | ORAL_TABLET | Freq: Once | ORAL | Status: AC
  Administered 2018-01-13: 650 mg via ORAL

## 2018-01-13 MED ORDER — SODIUM CHLORIDE 0.9% IV SOLUTION
250.0000 mL | Freq: Once | INTRAVENOUS | Status: AC
  Administered 2018-01-13: 250 mL via INTRAVENOUS
  Filled 2018-01-13: qty 250

## 2018-01-13 NOTE — Patient Instructions (Signed)
Platelet Transfusion, Care After °Refer to this sheet in the next few weeks. These instructions provide you with information about caring for yourself after your procedure. Your health care provider may also give you more specific instructions. Your treatment has been planned according to current medical practices, but problems sometimes occur. Call your health care provider if you have any problems or questions after your procedure. °What can I expect after the procedure? °After the procedure, it is common to have: °· Bruising and soreness at the IV site. °· Fever or chills within the first 48 hours of your transfusion. ° °Follow these instructions at home: °· Take medicines only as directed by your health care provider. Ask your health care provider if you can take an over-the-counter pain reliever in case you have a fever or headache a day or two after your transfusion. °· Return to your normal activities as directed by your health care provider. °Contact a health care provider if: °· You have a fever. °· You have a headache. °· You have redness, swelling, or pain at your IV site. °· You have skin itching or a rash. °· You vomit. °· You feel unusually tired or weak. °Get help right away if: °· You have trouble breathing. °· You have a decreased amount of urine or you urinate less often than you normally do. °· Your urine is darker than normal. °· You have pain in your back, abdomen, or chest. °· You have cool, clammy skin. °· You have a rapid heartbeat. °This information is not intended to replace advice given to you by your health care provider. Make sure you discuss any questions you have with your health care provider. °Document Released: 04/04/2014 Document Revised: 08/20/2015 Document Reviewed: 01/22/2014 °Elsevier Interactive Patient Education © 2018 [employer] ° °

## 2018-01-15 ENCOUNTER — Other Ambulatory Visit: Payer: Self-pay | Admitting: *Deleted

## 2018-01-15 ENCOUNTER — Telehealth: Payer: Self-pay | Admitting: *Deleted

## 2018-01-15 ENCOUNTER — Encounter: Payer: Self-pay | Admitting: Hematology & Oncology

## 2018-01-15 ENCOUNTER — Other Ambulatory Visit: Payer: Self-pay | Admitting: Family

## 2018-01-15 ENCOUNTER — Inpatient Hospital Stay: Payer: Medicare Other

## 2018-01-15 ENCOUNTER — Other Ambulatory Visit: Payer: Self-pay

## 2018-01-15 VITALS — BP 116/76 | HR 82 | Temp 98.3°F | Resp 18

## 2018-01-15 DIAGNOSIS — Z95828 Presence of other vascular implants and grafts: Secondary | ICD-10-CM

## 2018-01-15 DIAGNOSIS — D696 Thrombocytopenia, unspecified: Secondary | ICD-10-CM

## 2018-01-15 DIAGNOSIS — C9000 Multiple myeloma not having achieved remission: Secondary | ICD-10-CM

## 2018-01-15 DIAGNOSIS — D649 Anemia, unspecified: Secondary | ICD-10-CM

## 2018-01-15 DIAGNOSIS — C9002 Multiple myeloma in relapse: Secondary | ICD-10-CM | POA: Diagnosis not present

## 2018-01-15 LAB — BPAM PLATELET PHERESIS
BLOOD PRODUCT EXPIRATION DATE: 201910192359
Blood Product Expiration Date: 201910192359
ISSUE DATE / TIME: 201910190953
ISSUE DATE / TIME: 201910191031
UNIT TYPE AND RH: 6200
Unit Type and Rh: 600

## 2018-01-15 LAB — CMP (CANCER CENTER ONLY)
ALBUMIN: 2.8 g/dL — AB (ref 3.5–5.0)
ALK PHOS: 71 U/L (ref 26–84)
ALT: 33 U/L (ref 10–47)
AST: 34 U/L (ref 11–38)
Anion gap: 1 — ABNORMAL LOW (ref 5–15)
BILIRUBIN TOTAL: 0.5 mg/dL (ref 0.2–1.6)
BUN: 19 mg/dL (ref 7–22)
CHLORIDE: 116 mmol/L — AB (ref 98–108)
CO2: 23 mmol/L (ref 18–33)
CREATININE: 1.6 mg/dL — AB (ref 0.60–1.20)
Calcium: 9.5 mg/dL (ref 8.0–10.3)
Glucose, Bld: 113 mg/dL (ref 73–118)
Potassium: 3.9 mmol/L (ref 3.3–4.7)
Sodium: 140 mmol/L (ref 128–145)
Total Protein: 5.5 g/dL — ABNORMAL LOW (ref 6.4–8.1)

## 2018-01-15 LAB — CBC WITH DIFFERENTIAL (CANCER CENTER ONLY)
Abs Immature Granulocytes: 0.41 10*3/uL — ABNORMAL HIGH (ref 0.00–0.07)
BASOS ABS: 0 10*3/uL (ref 0.0–0.1)
Basophils Relative: 0 %
EOS ABS: 0 10*3/uL (ref 0.0–0.5)
EOS PCT: 0 %
HEMATOCRIT: 20.9 % — AB (ref 36.0–46.0)
HEMOGLOBIN: 6.5 g/dL — AB (ref 12.0–15.0)
Immature Granulocytes: 6 %
LYMPHS ABS: 1.1 10*3/uL (ref 0.7–4.0)
LYMPHS PCT: 17 %
MCH: 27.4 pg (ref 26.0–34.0)
MCHC: 31.1 g/dL (ref 30.0–36.0)
MCV: 88.2 fL (ref 80.0–100.0)
MONO ABS: 0.8 10*3/uL (ref 0.1–1.0)
Monocytes Relative: 12 %
NRBC: 0 % (ref 0.0–0.2)
Neutro Abs: 4.4 10*3/uL (ref 1.7–7.7)
Neutrophils Relative %: 65 %
Platelet Count: 29 10*3/uL — ABNORMAL LOW (ref 150–400)
RBC: 2.37 MIL/uL — ABNORMAL LOW (ref 3.87–5.11)
RDW: 16.6 % — AB (ref 11.5–15.5)
WBC Count: 6.7 10*3/uL (ref 4.0–10.5)

## 2018-01-15 LAB — SAMPLE TO BLOOD BANK

## 2018-01-15 LAB — PREPARE PLATELET PHERESIS
UNIT DIVISION: 0
Unit division: 0

## 2018-01-15 LAB — KAPPA/LAMBDA LIGHT CHAINS
KAPPA FREE LGHT CHN: 2959.3 mg/L — AB (ref 3.3–19.4)
Lambda free light chains: <1.5 mg/L — ABNORMAL LOW (ref 5.7–26.3)

## 2018-01-15 MED ORDER — SODIUM CHLORIDE 0.9% FLUSH
10.0000 mL | INTRAVENOUS | Status: DC | PRN
  Administered 2018-01-15: 10 mL via INTRAVENOUS
  Filled 2018-01-15: qty 10

## 2018-01-15 MED ORDER — HEPARIN SOD (PORK) LOCK FLUSH 100 UNIT/ML IV SOLN
500.0000 [IU] | Freq: Once | INTRAVENOUS | Status: AC
  Administered 2018-01-15: 500 [IU] via INTRAVENOUS
  Filled 2018-01-15: qty 5

## 2018-01-15 NOTE — Patient Instructions (Signed)
Thrombocytopenia Thrombocytopenia means that you have a low number of platelets in your blood. Platelets are tiny cells in the blood. When you bleed, they clump together at the cut or injury to stop the bleeding. This is called blood clotting. Not having enough platelets can cause bleeding problems. Follow these instructions at home: General instructions  Check your skin and inside your mouth for bruises or blood as told by your doctor.  Check to see if there is blood in your spit (sputum), pee (urine), and poop (stool). Do this as told by your doctor.  Ask your doctor if you can drink alcohol.  Take over-the-counter and prescription medicines only as told by your doctor.  Tell all of your doctors that you have this condition. Be sure to tell your dentist and eye doctor too. Activity  Do not do activities that can cause bumps or bruises until your doctor says it is okay.  Be careful not to cut yourself: ? When you shave. ? When you use scissors, needles, knives, or other tools.  Be careful not to burn yourself: ? When you use an iron. ? When you cook. Contact a doctor if:  You have bruises and you do not know why. Get help right away if:  You are bleeding anywhere on your body.  You have blood in your spit, pee, or poop. This information is not intended to replace advice given to you by your health care provider. Make sure you discuss any questions you have with your health care provider. Document Released: 03/03/2011 Document Revised: 11/15/2015 Document Reviewed: 09/15/2014 Elsevier Interactive Patient Education  2018 Elsevier Inc.   

## 2018-01-15 NOTE — Patient Instructions (Signed)
Implanted Port Insertion, Care After °This sheet gives you information about how to care for yourself after your procedure. Your health care provider may also give you more specific instructions. If you have problems or questions, contact your health care provider. °What can I expect after the procedure? °After your procedure, it is common to have: °· Discomfort at the port insertion site. °· Bruising on the skin over the port. This should improve over 3-4 days. ° °Follow these instructions at home: °Port care °· After your port is placed, you will get a manufacturer's information card. The card has information about your port. Keep this card with you at all times. °· Take care of the port as told by your health care provider. Ask your health care provider if you or a family member can get training for taking care of the port at home. A home health care nurse may also take care of the port. °· Make sure to remember what type of port you have. °Incision care °· Follow instructions from your health care provider about how to take care of your port insertion site. Make sure you: °? Wash your hands with soap and water before you change your bandage (dressing). If soap and water are not available, use hand sanitizer. °? Change your dressing as told by your health care provider. °? Leave stitches (sutures), skin glue, or adhesive strips in place. These skin closures may need to stay in place for 2 weeks or longer. If adhesive strip edges start to loosen and curl up, you may trim the loose edges. Do not remove adhesive strips completely unless your health care provider tells you to do that. °· Check your port insertion site every day for signs of infection. Check for: °? More redness, swelling, or pain. °? More fluid or blood. °? Warmth. °? Pus or a bad smell. °General instructions °· Do not take baths, swim, or use a hot tub until your health care provider approves. °· Do not lift anything that is heavier than 10 lb (4.5  kg) for a week, or as told by your health care provider. °· Ask your health care provider when it is okay to: °? Return to work or school. °? Resume usual physical activities or sports. °· Do not drive for 24 hours if you were given a medicine to help you relax (sedative). °· Take over-the-counter and prescription medicines only as told by your health care provider. °· Wear a medical alert bracelet in case of an emergency. This will tell any health care providers that you have a port. °· Keep all follow-up visits as told by your health care provider. This is important. °Contact a health care provider if: °· You cannot flush your port with saline as directed, or you cannot draw blood from the port. °· You have a fever or chills. °· You have more redness, swelling, or pain around your port insertion site. °· You have more fluid or blood coming from your port insertion site. °· Your port insertion site feels warm to the touch. °· You have pus or a bad smell coming from the port insertion site. °Get help right away if: °· You have chest pain or shortness of breath. °· You have bleeding from your port that you cannot control. °Summary °· Take care of the port as told by your health care provider. °· Change your dressing as told by your health care provider. °· Keep all follow-up visits as told by your health care provider. °  This information is not intended to replace advice given to you by your health care provider. Make sure you discuss any questions you have with your health care provider. °Document Released: 01/02/2013 Document Revised: 02/03/2016 Document Reviewed: 02/03/2016 °Elsevier Interactive Patient Education © 2017 [employer] ° °

## 2018-01-15 NOTE — Telephone Encounter (Signed)
Critical Value Hgb 6.5 Laverna Peace NP notified. No orders at this time

## 2018-01-16 ENCOUNTER — Encounter: Payer: Self-pay | Admitting: *Deleted

## 2018-01-16 ENCOUNTER — Encounter: Payer: Self-pay | Admitting: Hematology & Oncology

## 2018-01-16 LAB — PREPARE PLATELET PHERESIS: UNIT DIVISION: 0

## 2018-01-16 LAB — BPAM PLATELET PHERESIS
BLOOD PRODUCT EXPIRATION DATE: 201910212359
ISSUE DATE / TIME: 201910211031
UNIT TYPE AND RH: 6200

## 2018-01-17 ENCOUNTER — Inpatient Hospital Stay: Payer: Medicare Other

## 2018-01-17 VITALS — BP 96/44 | HR 107 | Temp 98.3°F | Resp 16 | Ht 63.0 in | Wt 97.5 lb

## 2018-01-17 DIAGNOSIS — C9000 Multiple myeloma not having achieved remission: Secondary | ICD-10-CM

## 2018-01-17 DIAGNOSIS — Z95828 Presence of other vascular implants and grafts: Secondary | ICD-10-CM

## 2018-01-17 DIAGNOSIS — C9002 Multiple myeloma in relapse: Secondary | ICD-10-CM | POA: Diagnosis not present

## 2018-01-17 LAB — CBC WITH DIFFERENTIAL (CANCER CENTER ONLY)
BASOS PCT: 0 %
BLASTS: 1 %
Band Neutrophils: 1 %
Basophils Absolute: 0 10*3/uL (ref 0.0–0.1)
EOS ABS: 0 10*3/uL (ref 0.0–0.5)
EOS PCT: 0 %
HEMATOCRIT: 21.5 % — AB (ref 36.0–46.0)
Hemoglobin: 6.7 g/dL — CL (ref 12.0–15.0)
Lymphocytes Relative: 5 %
Lymphs Abs: 0.4 10*3/uL — ABNORMAL LOW (ref 0.7–4.0)
MCH: 27.6 pg (ref 26.0–34.0)
MCHC: 31.2 g/dL (ref 30.0–36.0)
MCV: 88.5 fL (ref 80.0–100.0)
METAMYELOCYTES PCT: 3 %
MONO ABS: 0.3 10*3/uL (ref 0.1–1.0)
Monocytes Relative: 3 %
Myelocytes: 1 %
NRBC: 0 % (ref 0.0–0.2)
Neutro Abs: 7.4 10*3/uL (ref 1.7–7.7)
Neutrophils Relative %: 78 %
Other: 8 %
PLATELETS: 33 10*3/uL — AB (ref 150–400)
RBC: 2.43 MIL/uL — ABNORMAL LOW (ref 3.87–5.11)
RDW: 16.6 % — AB (ref 11.5–15.5)
WBC Morphology: 8
WBC: 8.9 10*3/uL (ref 4.0–10.5)

## 2018-01-17 LAB — SAMPLE TO BLOOD BANK

## 2018-01-17 LAB — CMP (CANCER CENTER ONLY)
ALBUMIN: 3 g/dL — AB (ref 3.5–5.0)
ALK PHOS: 65 U/L (ref 26–84)
ALT: 32 U/L (ref 10–47)
AST: 38 U/L (ref 11–38)
Anion gap: 3 — ABNORMAL LOW (ref 5–15)
BUN: 19 mg/dL (ref 7–22)
CALCIUM: 9.6 mg/dL (ref 8.0–10.3)
CHLORIDE: 108 mmol/L (ref 98–108)
CO2: 24 mmol/L (ref 18–33)
CREATININE: 1.8 mg/dL — AB (ref 0.60–1.20)
Glucose, Bld: 119 mg/dL — ABNORMAL HIGH (ref 73–118)
Potassium: 5 mmol/L — ABNORMAL HIGH (ref 3.3–4.7)
SODIUM: 135 mmol/L (ref 128–145)
Total Bilirubin: 0.5 mg/dL (ref 0.2–1.6)
Total Protein: 5.9 g/dL — ABNORMAL LOW (ref 6.4–8.1)

## 2018-01-17 MED ORDER — SODIUM CHLORIDE 0.9% FLUSH
10.0000 mL | INTRAVENOUS | Status: DC | PRN
  Administered 2018-01-17: 10 mL via INTRAVENOUS
  Filled 2018-01-17: qty 10

## 2018-01-17 MED ORDER — HEPARIN SOD (PORK) LOCK FLUSH 100 UNIT/ML IV SOLN
500.0000 [IU] | Freq: Once | INTRAVENOUS | Status: AC
  Administered 2018-01-17: 500 [IU] via INTRAVENOUS
  Filled 2018-01-17: qty 5

## 2018-01-18 ENCOUNTER — Other Ambulatory Visit: Payer: Self-pay | Admitting: Family

## 2018-01-18 ENCOUNTER — Other Ambulatory Visit: Payer: Self-pay | Admitting: *Deleted

## 2018-01-18 DIAGNOSIS — C9000 Multiple myeloma not having achieved remission: Secondary | ICD-10-CM

## 2018-01-18 LAB — PROTEIN ELECTROPHORESIS, SERUM, WITH REFLEX
A/G Ratio: 1.5 (ref 0.7–1.7)
ALBUMIN ELP: 3 g/dL (ref 2.9–4.4)
ALPHA-1-GLOBULIN: 0.3 g/dL (ref 0.0–0.4)
ALPHA-2-GLOBULIN: 0.7 g/dL (ref 0.4–1.0)
BETA GLOBULIN: 0.7 g/dL (ref 0.7–1.3)
GLOBULIN, TOTAL: 2 g/dL — AB (ref 2.2–3.9)
Gamma Globulin: 0.3 g/dL — ABNORMAL LOW (ref 0.4–1.8)
M-Spike, %: 0.1 g/dL — ABNORMAL HIGH
SPEP Interpretation: 0
TOTAL PROTEIN ELP: 5 g/dL — AB (ref 6.0–8.5)

## 2018-01-18 LAB — IMMUNOFIXATION REFLEX, SERUM
IGG (IMMUNOGLOBIN G), SERUM: 318 mg/dL — AB (ref 700–1600)
IgA: 26 mg/dL — ABNORMAL LOW (ref 87–352)
IgM (Immunoglobulin M), Srm: 16 mg/dL — ABNORMAL LOW (ref 26–217)

## 2018-01-19 ENCOUNTER — Telehealth: Payer: Self-pay | Admitting: *Deleted

## 2018-01-19 ENCOUNTER — Other Ambulatory Visit: Payer: Self-pay | Admitting: Family

## 2018-01-19 ENCOUNTER — Other Ambulatory Visit: Payer: Self-pay | Admitting: *Deleted

## 2018-01-19 ENCOUNTER — Inpatient Hospital Stay: Payer: Medicare Other

## 2018-01-19 VITALS — BP 90/46 | HR 103 | Resp 16

## 2018-01-19 VITALS — BP 95/54 | HR 105 | Temp 98.4°F | Resp 16

## 2018-01-19 DIAGNOSIS — C9000 Multiple myeloma not having achieved remission: Secondary | ICD-10-CM

## 2018-01-19 DIAGNOSIS — C9002 Multiple myeloma in relapse: Secondary | ICD-10-CM | POA: Diagnosis not present

## 2018-01-19 DIAGNOSIS — D696 Thrombocytopenia, unspecified: Secondary | ICD-10-CM

## 2018-01-19 DIAGNOSIS — Z95828 Presence of other vascular implants and grafts: Secondary | ICD-10-CM

## 2018-01-19 DIAGNOSIS — D649 Anemia, unspecified: Secondary | ICD-10-CM

## 2018-01-19 DIAGNOSIS — E860 Dehydration: Secondary | ICD-10-CM

## 2018-01-19 LAB — CMP (CANCER CENTER ONLY)
ALT: 38 U/L (ref 10–47)
AST: 36 U/L (ref 11–38)
Albumin: 2.7 g/dL — ABNORMAL LOW (ref 3.5–5.0)
Alkaline Phosphatase: 56 U/L (ref 26–84)
Anion gap: 4 — ABNORMAL LOW (ref 5–15)
BUN: 30 mg/dL — ABNORMAL HIGH (ref 7–22)
CO2: 22 mmol/L (ref 18–33)
Calcium: 9.4 mg/dL (ref 8.0–10.3)
Chloride: 108 mmol/L (ref 98–108)
Creatinine: 2.9 mg/dL — ABNORMAL HIGH (ref 0.60–1.20)
Glucose, Bld: 123 mg/dL — ABNORMAL HIGH (ref 73–118)
Potassium: 4.8 mmol/L — ABNORMAL HIGH (ref 3.3–4.7)
Sodium: 134 mmol/L (ref 128–145)
Total Bilirubin: 0.5 mg/dL (ref 0.2–1.6)
Total Protein: 5.6 g/dL — ABNORMAL LOW (ref 6.4–8.1)

## 2018-01-19 LAB — CBC WITH DIFFERENTIAL (CANCER CENTER ONLY)
Abs Immature Granulocytes: 0.44 10*3/uL — ABNORMAL HIGH (ref 0.00–0.07)
BASOS PCT: 0 %
Basophils Absolute: 0 10*3/uL (ref 0.0–0.1)
EOS ABS: 0 10*3/uL (ref 0.0–0.5)
EOS PCT: 0 %
HEMATOCRIT: 18.6 % — AB (ref 36.0–46.0)
Hemoglobin: 5.8 g/dL — CL (ref 12.0–15.0)
IMMATURE GRANULOCYTES: 6 %
LYMPHS ABS: 1.6 10*3/uL (ref 0.7–4.0)
Lymphocytes Relative: 22 %
MCH: 27.5 pg (ref 26.0–34.0)
MCHC: 31.2 g/dL (ref 30.0–36.0)
MCV: 88.2 fL (ref 80.0–100.0)
MONO ABS: 1.3 10*3/uL — AB (ref 0.1–1.0)
MONOS PCT: 18 %
NEUTROS PCT: 54 %
Neutro Abs: 4 10*3/uL (ref 1.7–7.7)
Platelet Count: 15 10*3/uL — ABNORMAL LOW (ref 150–400)
RBC: 2.11 MIL/uL — ABNORMAL LOW (ref 3.87–5.11)
RDW: 17.2 % — AB (ref 11.5–15.5)
WBC Count: 7.3 10*3/uL (ref 4.0–10.5)
nRBC: 0 % (ref 0.0–0.2)

## 2018-01-19 LAB — PREPARE RBC (CROSSMATCH)

## 2018-01-19 MED ORDER — SODIUM CHLORIDE 0.9% FLUSH
10.0000 mL | INTRAVENOUS | Status: DC | PRN
  Administered 2018-01-19: 10 mL via INTRAVENOUS
  Filled 2018-01-19: qty 10

## 2018-01-19 MED ORDER — HEPARIN SOD (PORK) LOCK FLUSH 100 UNIT/ML IV SOLN
500.0000 [IU] | Freq: Once | INTRAVENOUS | Status: AC
  Administered 2018-01-19: 500 [IU] via INTRAVENOUS
  Filled 2018-01-19: qty 5

## 2018-01-19 MED ORDER — SODIUM CHLORIDE 0.9 % IV SOLN
Freq: Once | INTRAVENOUS | Status: AC
  Administered 2018-01-19: 11:00:00 via INTRAVENOUS
  Filled 2018-01-19: qty 250

## 2018-01-19 NOTE — Progress Notes (Signed)
Patient needle removed  And patient ready to be discharged home when Creatinine came over printer.  Creatinine 2.9.  Dr. Maylon Peppers wants patient to receive a liter of fluids.  PAtient consents to this.  Reaccessed and fluids started.

## 2018-01-19 NOTE — Patient Instructions (Signed)
Anemia Anemia is a condition in which you do not have enough red blood cells or hemoglobin. Hemoglobin is a substance in red blood cells that carries oxygen. When you do not have enough red blood cells or hemoglobin (are anemic), your body cannot get enough oxygen and your organs may not work properly. As a result, you may feel very tired or have other problems. What are the causes? Common causes of anemia include:  Excessive bleeding. Anemia can be caused by excessive bleeding inside or outside the body, including bleeding from the intestine or from periods in women.  Poor nutrition.  Long-lasting (chronic) kidney, thyroid, and liver disease.  Bone marrow disorders.  Cancer and treatments for cancer.  HIV (human immunodeficiency virus) and AIDS (acquired immunodeficiency syndrome).  Treatments for HIV and AIDS.  Spleen problems.  Blood disorders.  Infections, medicines, and autoimmune disorders that destroy red blood cells.  What are the signs or symptoms? Symptoms of this condition include:  Minor weakness.  Dizziness.  Headache.  Feeling heartbeats that are irregular or faster than normal (palpitations).  Shortness of breath, especially with exercise.  Paleness.  Cold sensitivity.  Indigestion.  Nausea.  Difficulty sleeping.  Difficulty concentrating.  Symptoms may occur suddenly or develop slowly. If your anemia is mild, you may not have symptoms. How is this diagnosed? This condition is diagnosed based on:  Blood tests.  Your medical history.  A physical exam.  Bone marrow biopsy.  Your health care provider may also check your stool (feces) for blood and may do additional testing to look for the cause of your bleeding. You may also have other tests, including:  Imaging tests, such as a CT scan or MRI.  Endoscopy.  Colonoscopy.  How is this treated? Treatment for this condition depends on the cause. If you continue to lose a lot of blood,  you may need to be treated at a hospital. Treatment may include:  Taking supplements of iron, vitamin B12, or folic acid.  Taking a hormone medicine (erythropoietin) that can help to stimulate red blood cell growth.  Having a blood transfusion. This may be needed if you lose a lot of blood.  Making changes to your diet.  Having surgery to remove your spleen.  Follow these instructions at home:  Take over-the-counter and prescription medicines only as told by your health care provider.  Take supplements only as told by your health care provider.  Follow any diet instructions that you were given.  Keep all follow-up visits as told by your health care provider. This is important. Contact a health care provider if:  You develop new bleeding anywhere in the body. Get help right away if:  You are very weak.  You are short of breath.  You have pain in your abdomen or chest.  You are dizzy or feel faint.  You have trouble concentrating.  You have bloody or black, tarry stools.  You vomit repeatedly or you vomit up blood. Summary  Anemia is a condition in which you do not have enough red blood cells or enough of a substance in your red blood cells that carries oxygen (hemoglobin).  Symptoms may occur suddenly or develop slowly.  If your anemia is mild, you may not have symptoms.  This condition is diagnosed with blood tests as well as a medical history and physical exam. Other tests may be needed.  Treatment for this condition depends on the cause of the anemia. This information is not intended to replace advice   given to you by your health care provider. Make sure you discuss any questions you have with your health care provider. Document Released: 04/21/2004 Document Revised: 04/15/2016 Document Reviewed: 04/15/2016 Elsevier Interactive Patient Education  2018 [employer]. Thrombocytopenia Thrombocytopenia means that you have a low number of platelets in your blood.  Platelets are tiny cells in the blood. When you bleed, they clump together at the cut or injury to stop the bleeding. This is called blood clotting. Not having enough platelets can cause bleeding problems. Follow these instructions at home: General instructions  Check your skin and inside your mouth for bruises or blood as told by your doctor.  Check to see if there is blood in your spit (sputum), pee (urine), and poop (stool). Do this as told by your doctor.  Ask your doctor if you can drink alcohol.  Take over-the-counter and prescription medicines only as told by your doctor.  Tell all of your doctors that you have this condition. Be sure to tell your dentist and eye doctor too. Activity  Do not do activities that can cause bumps or bruises until your doctor says it is okay.  Be careful not to cut yourself: ? When you shave. ? When you use scissors, needles, knives, or other tools.  Be careful not to burn yourself: ? When you use an iron. ? When you cook. Contact a doctor if:  You have bruises and you do not know why. Get help right away if:  You are bleeding anywhere on your body.  You have blood in your spit, pee, or poop. This information is not intended to replace advice given to you by your health care provider. Make sure you discuss any questions you have with your health care provider. Document Released: 03/03/2011 Document Revised: 11/15/2015 Document Reviewed: 09/15/2014 Elsevier Interactive Patient Education  [employer].

## 2018-01-19 NOTE — Telephone Encounter (Signed)
Critical Value Hgb 5.8 Dr Maylon Peppers notified. Orders will be placed.

## 2018-01-19 NOTE — Patient Instructions (Signed)
Dehydration, Adult Dehydration is when there is not enough fluid or water in your body. This happens when you lose more fluids than you take in. Dehydration can range from mild to very bad. It should be treated right away to keep it from getting very bad. Symptoms of mild dehydration may include:  Thirst.  Dry lips.  Slightly dry mouth.  Dry, warm skin.  Dizziness. Symptoms of moderate dehydration may include:  Very dry mouth.  Muscle cramps.  Dark pee (urine). Pee may be the color of tea.  Your body making less pee.  Your eyes making fewer tears.  Heartbeat that is uneven or faster than normal (palpitations).  Headache.  Light-headedness, especially when you stand up from sitting.  Fainting (syncope). Symptoms of very bad dehydration may include:  Changes in skin, such as: ? Cold and clammy skin. ? Blotchy (mottled) or pale skin. ? Skin that does not quickly return to normal after being lightly pinched and let go (poor skin turgor).  Changes in body fluids, such as: ? Feeling very thirsty. ? Your eyes making fewer tears. ? Not sweating when body temperature is high, such as in hot weather. ? Your body making very little pee.  Changes in vital signs, such as: ? Weak pulse. ? Pulse that is more than 100 beats a minute when you are sitting still. ? Fast breathing. ? Low blood pressure.  Other changes, such as: ? Sunken eyes. ? Cold hands and feet. ? Confusion. ? Lack of energy (lethargy). ? Trouble waking up from sleep. ? Short-term weight loss. ? Unconsciousness. Follow these instructions at home:  If told by your doctor, drink an ORS: ? Make an ORS by using instructions on the package. ? Start by drinking small amounts, about  cup (120 mL) every 5-10 minutes. ? Slowly drink more until you have had the amount that your doctor said to have.  Drink enough clear fluid to keep your pee clear or pale yellow. If you were told to drink an ORS, finish the ORS  first, then start slowly drinking clear fluids. Drink fluids such as: ? Water. Do not drink only water by itself. Doing that can make the salt (sodium) level in your body get too low (hyponatremia). ? Ice chips. ? Fruit juice that you have added water to (diluted). ? Low-calorie sports drinks.  Avoid: ? Alcohol. ? Drinks that have a lot of sugar. These include high-calorie sports drinks, fruit juice that does not have water added, and soda. ? Caffeine. ? Foods that are greasy or have a lot of fat or sugar.  Take over-the-counter and prescription medicines only as told by your doctor.  Do not take salt tablets. Doing that can make the salt level in your body get too high (hypernatremia).  Eat foods that have minerals (electrolytes). Examples include bananas, oranges, potatoes, tomatoes, and spinach.  Keep all follow-up visits as told by your doctor. This is important. Contact a doctor if:  You have belly (abdominal) pain that: ? Gets worse. ? Stays in one area (localizes).  You have a rash.  You have a stiff neck.  You get angry or annoyed more easily than normal (irritability).  You are more sleepy than normal.  You have a harder time waking up than normal.  You feel: ? Weak. ? Dizzy. ? Very thirsty.  You have peed (urinated) only a small amount of very dark pee during 6-8 hours. Get help right away if:  You have symptoms of   very bad dehydration.  You cannot drink fluids without throwing up (vomiting).  Your symptoms get worse with treatment.  You have a fever.  You have a very bad headache.  You are throwing up or having watery poop (diarrhea) and it: ? Gets worse. ? Does not go away.  You have blood or something green (bile) in your throw-up.  You have blood in your poop (stool). This may cause poop to look black and tarry.  You have not peed in 6-8 hours.  You pass out (faint).  Your heart rate when you are sitting still is more than 100 beats a  minute.  You have trouble breathing. This information is not intended to replace advice given to you by your health care provider. Make sure you discuss any questions you have with your health care provider. Document Released: 01/08/2009 Document Revised: 10/02/2015 Document Reviewed: 05/08/2015 Elsevier Interactive Patient Education  2018 [employer]  

## 2018-01-19 NOTE — Progress Notes (Signed)
Patient Hgb 5.8 and platelets 25.  Dr. Maylon Peppers notified.  PAtient to get blood and platelets on Monday.  Patient and husband reminded of thrombocytopenic precaution

## 2018-01-21 ENCOUNTER — Other Ambulatory Visit: Payer: Self-pay | Admitting: Radiology

## 2018-01-22 ENCOUNTER — Other Ambulatory Visit: Payer: Self-pay | Admitting: Family

## 2018-01-22 ENCOUNTER — Other Ambulatory Visit: Payer: Self-pay

## 2018-01-22 ENCOUNTER — Telehealth: Payer: Self-pay | Admitting: *Deleted

## 2018-01-22 ENCOUNTER — Inpatient Hospital Stay: Payer: Medicare Other

## 2018-01-22 ENCOUNTER — Inpatient Hospital Stay: Payer: Medicare Other | Admitting: Hematology & Oncology

## 2018-01-22 ENCOUNTER — Encounter: Payer: Self-pay | Admitting: Hematology & Oncology

## 2018-01-22 ENCOUNTER — Inpatient Hospital Stay (HOSPITAL_BASED_OUTPATIENT_CLINIC_OR_DEPARTMENT_OTHER): Payer: Medicare Other | Admitting: Hematology & Oncology

## 2018-01-22 VITALS — BP 82/47 | HR 90 | Temp 98.2°F | Resp 18

## 2018-01-22 VITALS — BP 96/49 | HR 70 | Temp 97.8°F | Resp 18 | Wt 91.0 lb

## 2018-01-22 DIAGNOSIS — C9000 Multiple myeloma not having achieved remission: Secondary | ICD-10-CM

## 2018-01-22 DIAGNOSIS — N179 Acute kidney failure, unspecified: Secondary | ICD-10-CM

## 2018-01-22 DIAGNOSIS — R634 Abnormal weight loss: Secondary | ICD-10-CM

## 2018-01-22 DIAGNOSIS — C9002 Multiple myeloma in relapse: Secondary | ICD-10-CM | POA: Diagnosis not present

## 2018-01-22 DIAGNOSIS — D696 Thrombocytopenia, unspecified: Secondary | ICD-10-CM

## 2018-01-22 DIAGNOSIS — D649 Anemia, unspecified: Secondary | ICD-10-CM

## 2018-01-22 LAB — CMP (CANCER CENTER ONLY)
ALK PHOS: 63 U/L (ref 26–84)
ALT: 28 U/L (ref 10–47)
ANION GAP: 5 (ref 5–15)
AST: 36 U/L (ref 11–38)
Albumin: 2.7 g/dL — ABNORMAL LOW (ref 3.5–5.0)
BUN: 39 mg/dL — ABNORMAL HIGH (ref 7–22)
CHLORIDE: 113 mmol/L — AB (ref 98–108)
CO2: 21 mmol/L (ref 18–33)
Calcium: 10.9 mg/dL — ABNORMAL HIGH (ref 8.0–10.3)
Creatinine: 3.2 mg/dL (ref 0.60–1.20)
Glucose, Bld: 120 mg/dL — ABNORMAL HIGH (ref 73–118)
POTASSIUM: 5.6 mmol/L — AB (ref 3.3–4.7)
Sodium: 139 mmol/L (ref 128–145)
TOTAL PROTEIN: 5.9 g/dL — AB (ref 6.4–8.1)
Total Bilirubin: 0.6 mg/dL (ref 0.2–1.6)

## 2018-01-22 LAB — SAMPLE TO BLOOD BANK

## 2018-01-22 MED ORDER — DIPHENHYDRAMINE HCL 25 MG PO CAPS
ORAL_CAPSULE | ORAL | Status: AC
  Filled 2018-01-22: qty 1

## 2018-01-22 MED ORDER — HEPARIN SOD (PORK) LOCK FLUSH 100 UNIT/ML IV SOLN
500.0000 [IU] | Freq: Every day | INTRAVENOUS | Status: AC | PRN
  Administered 2018-01-22: 500 [IU]
  Filled 2018-01-22: qty 5

## 2018-01-22 MED ORDER — SODIUM CHLORIDE 0.9% FLUSH
10.0000 mL | INTRAVENOUS | Status: AC | PRN
  Administered 2018-01-22: 10 mL
  Filled 2018-01-22: qty 10

## 2018-01-22 MED ORDER — SODIUM CHLORIDE 0.9% IV SOLUTION
250.0000 mL | Freq: Once | INTRAVENOUS | Status: DC
  Filled 2018-01-22: qty 250

## 2018-01-22 MED ORDER — ACETAMINOPHEN 325 MG PO TABS
650.0000 mg | ORAL_TABLET | Freq: Once | ORAL | Status: AC
  Administered 2018-01-22: 650 mg via ORAL

## 2018-01-22 MED ORDER — DIPHENHYDRAMINE HCL 25 MG PO CAPS
25.0000 mg | ORAL_CAPSULE | Freq: Once | ORAL | Status: AC
  Administered 2018-01-22: 25 mg via ORAL

## 2018-01-22 MED ORDER — ACETAMINOPHEN 325 MG PO TABS
ORAL_TABLET | ORAL | Status: AC
  Filled 2018-01-22: qty 2

## 2018-01-22 MED ORDER — SELINEXOR (100 MG ONCE WEEKLY) 20 MG PO TBPK: 100.0000 mg | ORAL_TABLET | ORAL | 2 refills | Status: DC

## 2018-01-22 NOTE — Progress Notes (Signed)
Hematology and Oncology Follow Up Visit  Jeanne Martinez [id number] [birth year] 67 y.o. 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  Unfortunately, she is already relapsing.  Her Kappa light chain from a week ago was 3000 mg/L.  A couple weeks before that it was 1500 mg/L.  More importantly, is the fact that her renal function is going to go back up.  Her creatinine a week ago was 1.4.  Today it was 3.2.  We are really in a tough way now.  We are trying to get her on a compassionate use trial for a medication that they have on clinical trial down in [place].  I am not sure that she is going to be able to make it for that.  Particularly, this might be a problem given her renal function.  She does not feel all that well.  I am not sure what is going on with that.  She said that she got a "shot" down in [place].  When I talked to her oncologist in [place], he says she did not get any injection.  She is to have a bone marrow test tomorrow.  This is to try to get cytogenetic material to see if she has a t(11:14).  If she does, then we will get her on venetoclax.  In talking with Jeanne Martinez, we decided to try to get her on silexinor, which is just approved for myeloma.  The dose will be 100 mg p.o. weekly.  Unfortunately, I am not sure how long it will take for [place] to get this.  She will have her labs done on Wednesday.  If we find that she  is deteriorating quickly with her renal function, we may have to admit her and get her started back on VD-PACE which clearly works for her.  This is really tough.  She is doing everything we asked her to do.  She just has incredibly aggressive myeloma, which has a light chain component that really affects her kidney function when its activity increases.  Her weight is down quite a bit.  Her weight is now down to 91 pounds.  She says that she is eating.  Unfortunately, her short-term memory issues really preclude aspirin on if she is eating.  Her husband says that she is eating.  She is had no bleeding.  There is been no mouth sores.  She is had no diarrhea.  Thankfully, her blood counts are improving off treatment right now.  Her white cell count is 8.4.  Hemoglobin 6.2.  Platelet count is 8000.  She is getting platelets and blood today.  Overall, her performance status is ECOG 1-2.   Medications:  Allergies as of 01/22/2018   No Known Allergies     Medication List        Accurate as of 01/22/18  5:23 PM. Always use your most recent med list.          antiseptic oral rinse  Liqd 15 mLs by Mouth Rinse route every 4 (four) hours.   ciprofloxacin 500 MG tablet Commonly known as:  CIPRO Take 1 tablet (500 mg total) by mouth daily with breakfast.   famciclovir 250 MG tablet Commonly known as:  FAMVIR TAKE 1 TABLET BY MOUTH EVERY DAY   fluconazole 100 MG tablet Commonly known as:  DIFLUCAN Take 1 tablet (100 mg total) by mouth daily.   lidocaine-prilocaine cream Commonly known as:  EMLA Apply 1 application topically as needed. Apply to port one hour before appointment   LORazepam 0.5 MG tablet Commonly known as:  ATIVAN Take 1 tablet (0.5 mg total) by mouth every 6 (six) hours as needed (Nausea or vomiting).   MAGNESIUM SULFATE PO Take 800 mg by mouth 2 (two) times daily.   multivitamin with minerals Tabs tablet Take 1 tablet by mouth daily.   ondansetron 8 MG  tablet Commonly known as:  ZOFRAN Take 1 tablet (8 mg total) by mouth 2 (two) times daily as needed for refractory nausea / vomiting. Start on the second day after inpatient chemo completed.   potassium chloride SA 20 MEQ tablet Commonly known as:  K-DUR,KLOR-CON Take 2 tablets (40 mEq total) by mouth daily.   prochlorperazine 10 MG tablet Commonly known as:  COMPAZINE Take 1 tablet (10 mg total) by mouth every 6 (six) hours as needed (Nausea or vomiting).   Selinexor (100 MG Once Weekly) 20 MG Tbpk Take 100 mg by mouth once a week.   sodium bicarbonate/sodium chloride Soln 1 application by Mouth Rinse route every 4 (four) hours.       Allergies: No Known Allergies  Past Medical History, Surgical history, Social history, and Family History were reviewed and updated.  Review of Systems: Review of Systems  Constitutional: Positive for malaise/fatigue and weight loss.  HENT: Negative.   Eyes: Negative.   Respiratory: Negative.   Cardiovascular: Negative.   Gastrointestinal: Negative.   Genitourinary: Negative.   Musculoskeletal: Negative.   Skin: Negative.   Neurological: Positive for weakness.  Endo/Heme/Allergies: Negative.   Psychiatric/Behavioral: Positive for memory loss.     Physical Exam:  weight is 91 lb (41.3 kg). Her oral temperature is 97.8 F (36.6 C). Her blood pressure is 96/49 (abnormal) and her pulse is 70. Her respiration is 18 and oxygen saturation is 96%.   Wt Readings from Last 3 Encounters:  01/22/18 91 lb (41.3 kg)  01/17/18 97 lb 8 oz (44.2 kg)  01/12/18 97 lb 8 oz (44.2 kg)    Physical Exam  Constitutional: She is oriented to person, place, and time.  HENT:  Head: Normocephalic and atraumatic.  Mouth/Throat: Oropharynx is clear and moist.  Eyes: Pupils are equal, round, and reactive to light. EOM are normal.  Neck: Normal range of motion.  Cardiovascular: Normal rate, regular rhythm and normal heart sounds.  Pulmonary/Chest: Effort  normal and breath sounds normal.  Abdominal: Soft. Bowel sounds are normal.  Musculoskeletal: Normal range of motion. She exhibits no edema, tenderness or deformity.  Lymphadenopathy:    She has no cervical adenopathy.  Neurological: She is alert and oriented to person, place, and time.  Skin: Skin is warm and dry. No rash noted. No erythema.  Psychiatric: She has a normal mood and affect. Her behavior is normal. Judgment and thought content normal.  Vitals reviewed.    Lab Results  Component Value Date   WBC 8.4 01/22/2018   HGB 6.2 (LL) 01/22/2018   HCT 19.2 (L) 01/22/2018  MCV 86.9 01/22/2018   PLT 8 (LL) 01/22/2018   Lab Results  Component Value Date   FERRITIN 1,608 (H) 08/23/2017   IRON 98 08/23/2017   TIBC 210 (L) 08/23/2017   UIBC 112 08/23/2017   IRONPCTSAT 47 08/23/2017   Lab Results  Component Value Date   RBC 2.21 (L) 01/22/2018   Lab Results  Component Value Date   KPAFRELGTCHN 2,959.3 (H) 01/12/2018   LAMBDASER <1.5 (L) 01/12/2018   KAPLAMBRATIO See below. 01/12/2018   Lab Results  Component Value Date   IGGSERUM 318 (L) 01/12/2018   IGA 26 (L) 01/12/2018   IGMSERUM 16 (L) 01/12/2018   Lab Results  Component Value Date   TOTALPROTELP 5.0 (L) 01/12/2018   ALBUMINELP 3.0 01/12/2018   A1GS 0.3 01/12/2018   A2GS 0.7 01/12/2018   BETS 0.7 01/12/2018   GAMS 0.3 (L) 01/12/2018   MSPIKE 0.1 (H) 01/12/2018   SPEI Comment 10/06/2017     Chemistry      Component Value Date/Time   NA 139 01/22/2018 1120   NA 148 (H) 03/17/2017 1210   K 5.6 (H) 01/22/2018 1120   K 4.4 03/17/2017 1210   CL 113 (H) 01/22/2018 1120   CL 106 03/17/2017 1210   CO2 21 01/22/2018 1120   CO2 27 03/17/2017 1210   BUN 39 (H) 01/22/2018 1120   BUN 23 (H) 03/17/2017 1210   CREATININE 3.20 (HH) 01/22/2018 1120   CREATININE 1.1 03/17/2017 1210      Component Value Date/Time   CALCIUM 10.9 (H) 01/22/2018 1120   CALCIUM 9.1 03/17/2017 1210   ALKPHOS 63 01/22/2018 1120    ALKPHOS 52 03/17/2017 1210   AST 36 01/22/2018 1120   ALT 28 01/22/2018 1120   ALT 21 03/17/2017 1210   BILITOT 0.6 01/22/2018 1120       Impression and Plan: Ms. Rosales is a pleasant 67 yo caucasian female with IgA kappa myeloma with 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   Again, we will try to get her on the silexinor.  Hopefully we can get this week.  I will use this with Decadron at 20 mg weekly.  Again, if we see that her renal function is deteriorating further, then we will probably have to admit her.  Her calcium was up a little bit.  This also is concerning.  I talked to her son today.  I told him what was going on.  I said that her options are certainly not that great.  We are trying everything that we can do to try to help his mom.  I am just not sure if we are going to be able to get her down to [facility] for the compassionate use trial.  I will have to see her back in 1 week, if not sooner.  I have a feeling that she probably is going to end up in the hospital before 1 week.  I spent about an hour with she and her husband.  I then spent extra time with her son.  All the time was spent face-to-face. Marland Kitchen   Volanda Napoleon, MD 10/28/20195:23 PM

## 2018-01-22 NOTE — Patient Instructions (Signed)
Blood Transfusion, Adult A blood transfusion is a procedure in which you receive donated blood, including plasma, platelets, and red blood cells, through an IV tube. You may need a blood transfusion because of illness, surgery, or injury. The blood may come from a donor. You may also be able to donate blood for yourself (autologous blood donation) before a surgery if you know that you might require a blood transfusion. The blood given in a transfusion is made up of different types of cells. You may receive:  Red blood cells. These carry oxygen to the cells in the body.  White blood cells. These help you fight infections.  Platelets. These help your blood to clot.  Plasma. This is the liquid part of your blood and it helps with fluid imbalances.  If you have hemophilia or another clotting disorder, you may also receive other types of blood products. Tell a health care provider about:  Any allergies you have.  All medicines you are taking, including vitamins, herbs, eye drops, creams, and over-the-counter medicines.  Any problems you or family members have had with anesthetic medicines.  Any blood disorders you have.  Any surgeries you have had.  Any medical conditions you have, including any recent fever or cold symptoms.  Whether you are pregnant or may be pregnant.  Any previous reactions you have had during a blood transfusion. What are the risks? Generally, this is a safe procedure. However, problems may occur, including:  Having an allergic reaction to something in the donated blood. Hives and itching may be symptoms of this type of reaction.  Fever. This may be a reaction to the white blood cells in the transfused blood. Nausea or chest pain may accompany a fever.  Iron overload. This can happen from having many transfusions.  Transfusion-related acute lung injury (TRALI). This is a rare reaction that causes lung damage. The cause is not known.TRALI can occur within hours  of a transfusion or several days later.  Sudden (acute) or delayed hemolytic reactions. This happens if your blood does not match the cells in your transfusion. Your body's defense system (immune system) may try to attack the new cells. This complication is rare. The symptoms include fever, chills, nausea, and low back pain or chest pain.  Infection or disease transmission. This is rare.  What happens before the procedure?  You will have a blood test to determine your blood type. This is necessary to know what kind of blood your body will accept and to match it to the donor blood.  If you are going to have a planned surgery, you may be able to do an autologous blood donation. This may be done in case you need to have a transfusion.  If you have had an allergic reaction to a transfusion in the past, you may be given medicine to help prevent a reaction. This medicine may be given to you by mouth or through an IV tube.  You will have your temperature, blood pressure, and pulse monitored before the transfusion.  Follow instructions from your health care provider about eating and drinking restrictions.  Ask your health care provider about: ? Changing or stopping your regular medicines. This is especially important if you are taking diabetes medicines or blood thinners. ? Taking medicines such as aspirin and ibuprofen. These medicines can thin your blood. Do not take these medicines before your procedure if your health care provider instructs you not to. What happens during the procedure?  An IV tube will   be inserted into one of your veins.  The bag of donated blood will be attached to your IV tube. The blood will then enter through your vein.  Your temperature, blood pressure, and pulse will be monitored regularly during the transfusion. This monitoring is done to detect early signs of a transfusion reaction.  If you have any signs or symptoms of a reaction, your transfusion will be stopped  and you may be given medicine.  When the transfusion is complete, your IV tube will be removed.  Pressure may be applied to the IV site for a few minutes.  A bandage (dressing) will be applied. The procedure may vary among health care providers and hospitals. What happens after the procedure?  Your temperature, blood pressure, heart rate, breathing rate, and blood oxygen level will be monitored often.  Your blood may be tested to see how you are responding to the transfusion.  You may be warmed with fluids or blankets to maintain a normal body temperature. Summary  A blood transfusion is a procedure in which you receive donated blood, including plasma, platelets, and red blood cells, through an IV tube.  Your temperature, blood pressure, and pulse will be monitored before, during, and after the transfusion.  Your blood may be tested after the transfusion to see how your body has responded. This information is not intended to replace advice given to you by your health care provider. Make sure you discuss any questions you have with your health care provider. Document Released: 03/11/2000 Document Revised: 12/10/2015 Document Reviewed: 12/10/2015 Elsevier Interactive Patient Education  2018 [employer].  Platelet Transfusion A platelet transfusion is a procedure in which you receive donated platelets through an IV tube. Platelets are tiny pieces of blood cells. When a blood vessel is damaged, platelets collect in the damaged area to help form a blood clot. This begins the healing process. If your platelet count gets too low, your blood may have trouble clotting. You may need a platelet transfusion if you have a condition that causes a low number of platelets (thrombocytopenia). A platelet transfusion may be used to stop or prevent bleeding. Tell a health care provider about:  Any allergies you have.  All medicines you are taking, including vitamins, herbs, eye drops, creams, and  over-the-counter medicines.  Any problems you or family members have had with anesthetic medicines.  Any blood disorders you have.  Any surgeries you have had.  Any medical conditions you have.  Any reactions you have had during a previous transfusion. What are the risks? Generally, this is a safe procedure. However, problems may occur, including:  Fever with or without chills. The fever usually occurs within the first 4 hours of the transfusion and returns to normal within 48 hours.  Allergic reaction. The reaction is most commonly caused by antibodies your body creates against substances in the transfusion. Signs of an allergic reaction may include itching, hives, difficulty breathing, shock, or low blood pressure.  Sudden (acute) or delayed hemolytic reaction. This rare reaction can occur during the transfusion and up to 28 days after the transfusion. The reaction usually occurs when your body's defense system (immune system) attacks the new platelets. Signs of a hemolytic reaction may include fever, headache, difficulty breathing, low blood pressure, a rapid heartbeat, or pain in your back, abdomen, chest, or IV site.  Transfusion-related acute lung injury (TRALI). TRALI can occur within hours of a transfusion, or several days later. This is a rare reaction that causes lung damage.  The cause is not known.  Infection. Signs of this rare complication may include fever, chills, vomiting, a rapid heartbeat, or low blood pressure.  What happens before the procedure?  You may have a blood test to determine your blood type. This is necessary to find out what kind ofplatelets best matches your platelets.  If you have had an allergic reaction to a transfusion in the past, you may be given medicine to help prevent a reaction. Take this medicine only as directed by your health care provider.  Your temperature, blood pressure, and pulse will be monitored before the transfusion. What happens  during the procedure?  An IV will be started in your hand or arm.  The transfusion will be attached to your IV tubing. The bag of donated platelets will be attached to your IV tube andgiven into your vein.  Your temperature, blood pressure, and pulse will be monitored regularly during the transfusion. This monitoring is done to help detect early signs of a transfusion reaction.  If you have any signs or symptoms of a reaction, your transfusion will be stopped and you may be given medicine.  When your transfusion is complete, your IV will be removed.  Pressure may be applied to the IV site for a few minutes.  A bandage (dressing) will be applied. The procedure may vary among health care providers and hospitals. What happens after the procedure?  Your blood pressure, temperature, and pulse will be monitored regularly. This information is not intended to replace advice given to you by your health care provider. Make sure you discuss any questions you have with your health care provider. Document Released: 01/09/2007 Document Revised: 08/20/2015 Document Reviewed: 01/22/2014 Elsevier Interactive Patient Education  2018 [employer].

## 2018-01-22 NOTE — Telephone Encounter (Signed)
Dr. Marin Olp notified of critical Hgb-6.2 and plts-8.  Pt currently in office receiving platelet and blood transfusion. No new orders received.

## 2018-01-22 NOTE — Telephone Encounter (Signed)
Dr. Marin Olp notified of Creat-3.2 and Potassium-5.6.  Patient notified to hold Potassium per order of Dr. Marin Olp.

## 2018-01-22 NOTE — Progress Notes (Signed)
Patient blood pressure continuously low throughout the day. Patient denies dizziness, headaches or lightheadedness. Dr. Marin Olp notified. Ok to discharge. Patient discharged ambulatory with spouse and son.

## 2018-01-23 ENCOUNTER — Ambulatory Visit (HOSPITAL_COMMUNITY)
Admission: RE | Admit: 2018-01-23 | Discharge: 2018-01-23 | Disposition: A | Discharge status: Home / Self Care | Payer: Medicare Other | Source: Ambulatory Visit | Attending: Hematology & Oncology | Admitting: Hematology & Oncology

## 2018-01-23 ENCOUNTER — Telehealth: Payer: Self-pay | Admitting: Pharmacist

## 2018-01-23 ENCOUNTER — Ambulatory Visit (HOSPITAL_COMMUNITY)
Admission: RE | Admit: 2018-01-23 | Discharge: 2018-01-23 | Disposition: A | Discharge status: Home / Self Care | Payer: Medicare Other | Source: Ambulatory Visit | Attending: Family | Admitting: Family

## 2018-01-23 ENCOUNTER — Encounter (HOSPITAL_COMMUNITY): Payer: Self-pay

## 2018-01-23 ENCOUNTER — Encounter: Payer: Self-pay | Admitting: Hematology & Oncology

## 2018-01-23 DIAGNOSIS — I129 Hypertensive chronic kidney disease with stage 1 through stage 4 chronic kidney disease, or unspecified chronic kidney disease: Secondary | ICD-10-CM | POA: Insufficient documentation

## 2018-01-23 DIAGNOSIS — Z79899 Other long term (current) drug therapy: Secondary | ICD-10-CM

## 2018-01-23 DIAGNOSIS — C9000 Multiple myeloma not having achieved remission: Secondary | ICD-10-CM

## 2018-01-23 DIAGNOSIS — D631 Anemia in chronic kidney disease: Secondary | ICD-10-CM | POA: Insufficient documentation

## 2018-01-23 DIAGNOSIS — D696 Thrombocytopenia, unspecified: Secondary | ICD-10-CM | POA: Insufficient documentation

## 2018-01-23 DIAGNOSIS — N183 Chronic kidney disease, stage 3 (moderate): Secondary | ICD-10-CM

## 2018-01-23 LAB — CBC WITH DIFFERENTIAL (CANCER CENTER ONLY)
Abs Immature Granulocytes: 0 10*3/uL (ref 0.00–0.07)
BAND NEUTROPHILS: 7 %
BASOS ABS: 0 10*3/uL (ref 0.0–0.1)
BASOS PCT: 0 %
Basophils Absolute: 0 10*3/uL (ref 0.0–0.1)
Basophils Relative: 0 %
Blasts: 17 %
EOS ABS: 0 10*3/uL (ref 0.0–0.5)
Eosinophils Absolute: 0 10*3/uL (ref 0.0–0.5)
Eosinophils Relative: 0 %
Eosinophils Relative: 0 %
HCT: 26.5 % — ABNORMAL LOW (ref 36.0–46.0)
HCT: 19.2 % — ABNORMAL LOW (ref 36.0–46.0)
Hemoglobin: 8.3 g/dL — ABNORMAL LOW (ref 12.0–15.0)
Hemoglobin: 6.2 g/dL — CL (ref 12.0–15.0)
IMMATURE GRANULOCYTES: 0 %
LYMPHS ABS: 0.3 10*3/uL — AB (ref 0.7–4.0)
LYMPHS ABS: 0.3 10*3/uL — AB (ref 0.7–4.0)
LYMPHS PCT: 3 %
Lymphocytes Relative: 8 %
MCH: 27.9 pg (ref 26.0–34.0)
MCH: 28.1 pg (ref 26.0–34.0)
MCHC: 31.3 g/dL (ref 30.0–36.0)
MCHC: 32.3 g/dL (ref 30.0–36.0)
MCV: 88.9 fL (ref 80.0–100.0)
MCV: 86.9 fL (ref 80.0–100.0)
METAMYELOCYTES PCT: 4 %
MONO ABS: 0.9 10*3/uL (ref 0.1–1.0)
MONOS PCT: 11 %
MYELOCYTES: 1 %
Monocytes Absolute: 0.4 10*3/uL (ref 0.1–1.0)
Monocytes Relative: 9 %
NEUTROS ABS: 5.7 10*3/uL (ref 1.7–7.7)
NEUTROS PCT: 74 %
Neutro Abs: 3 10*3/uL (ref 1.7–7.7)
Neutrophils Relative %: 56 %
Other: 1 %
PLATELETS: 8 10*3/uL — AB (ref 150–400)
Platelet Count: 9 10*3/uL — CL (ref 150–400)
RBC: 2.98 MIL/uL — ABNORMAL LOW (ref 3.87–5.11)
RBC: 2.21 MIL/uL — AB (ref 3.87–5.11)
RDW: 15.8 % — AB (ref 11.5–15.5)
RDW: 17.6 % — AB (ref 11.5–15.5)
WBC Count: 4 10*3/uL (ref 4.0–10.5)
WBC: 8.4 10*3/uL (ref 4.0–10.5)
nRBC: 0 % (ref 0.0–0.2)
nRBC: 0 % (ref 0.0–0.2)

## 2018-01-23 LAB — CBC WITH DIFFERENTIAL/PLATELET
BASOS ABS: 0 10*3/uL (ref 0.0–0.1)
Basophils Relative: 0 %
Eosinophils Absolute: 0 10*3/uL (ref 0.0–0.5)
Eosinophils Relative: 0 %
HEMATOCRIT: 24 % — AB (ref 36.0–46.0)
HEMOGLOBIN: 7.5 g/dL — AB (ref 12.0–15.0)
LYMPHS ABS: 2.7 10*3/uL (ref 0.7–4.0)
LYMPHS PCT: 27 %
MCH: 27.1 pg (ref 26.0–34.0)
MCHC: 31.3 g/dL (ref 30.0–36.0)
MCV: 86.6 fL (ref 80.0–100.0)
MYELOCYTES: 1 %
Monocytes Absolute: 0.6 10*3/uL (ref 0.1–1.0)
Monocytes Relative: 6 %
NEUTROS PCT: 66 %
NRBC: 0 % (ref 0.0–0.2)
Neutro Abs: 6.6 10*3/uL (ref 1.7–7.7)
Platelets: 47 10*3/uL — ABNORMAL LOW (ref 150–400)
RBC: 2.77 MIL/uL — ABNORMAL LOW (ref 3.87–5.11)
RDW: 20.5 % — AB (ref 11.5–15.5)
WBC: 9.9 10*3/uL (ref 4.0–10.5)

## 2018-01-23 LAB — BPAM PLATELET PHERESIS
BLOOD PRODUCT EXPIRATION DATE: 201910282359
Blood Product Expiration Date: 201910292359
ISSUE DATE / TIME: 201910280749
ISSUE DATE / TIME: 201910280749
Unit Type and Rh: 6200
Unit Type and Rh: 6200

## 2018-01-23 LAB — BPAM RBC
BLOOD PRODUCT EXPIRATION DATE: 201911072359
ISSUE DATE / TIME: 201910280754
Unit Type and Rh: 6200

## 2018-01-23 LAB — PREPARE PLATELET PHERESIS
UNIT DIVISION: 0
Unit division: 0

## 2018-01-23 LAB — TYPE AND SCREEN
ABO/RH(D): A POS
ANTIBODY SCREEN: NEGATIVE
Unit division: 0

## 2018-01-23 LAB — KAPPA/LAMBDA LIGHT CHAINS
KAPPA FREE LGHT CHN: 23623.3 mg/L — AB (ref 3.3–19.4)
Kappa, lambda light chain ratio: 9843.04 — ABNORMAL HIGH (ref 0.26–1.65)
LAMDA FREE LIGHT CHAINS: 2.4 mg/L — AB (ref 5.7–26.3)

## 2018-01-23 LAB — PROTIME-INR
INR: 1.02
Prothrombin Time: 13.3 seconds (ref 11.4–15.2)

## 2018-01-23 LAB — IGG, IGA, IGM
IgA: 38 mg/dL — ABNORMAL LOW (ref 87–352)
IgG (Immunoglobin G), Serum: 382 mg/dL — ABNORMAL LOW (ref 700–1600)
IgM (Immunoglobulin M), Srm: 23 mg/dL — ABNORMAL LOW (ref 26–217)

## 2018-01-23 LAB — BASIC METABOLIC PANEL
ANION GAP: 13 (ref 5–15)
BUN: 53 mg/dL — ABNORMAL HIGH (ref 8–23)
CALCIUM: 10.4 mg/dL — AB (ref 8.9–10.3)
CHLORIDE: 107 mmol/L (ref 98–111)
CO2: 17 mmol/L — AB (ref 22–32)
Creatinine, Ser: 3.53 mg/dL — ABNORMAL HIGH (ref 0.44–1.00)
GFR calc non Af Amer: 12 mL/min — ABNORMAL LOW (ref >60)
GFR, EST AFRICAN AMERICAN: 14 mL/min — AB (ref >60)
Glucose, Bld: 104 mg/dL — ABNORMAL HIGH (ref 70–99)
Potassium: 5.4 mmol/L — ABNORMAL HIGH (ref 3.5–5.1)
Sodium: 137 mmol/L (ref 135–145)

## 2018-01-23 MED ORDER — LIDOCAINE HCL (PF) 1 % IJ SOLN
INTRAMUSCULAR | Status: AC | PRN
  Administered 2018-01-23: 10 mL

## 2018-01-23 MED ORDER — MIDAZOLAM HCL 2 MG/2ML IJ SOLN
INTRAMUSCULAR | Status: AC | PRN
  Administered 2018-01-23 (×2): 1 mg via INTRAVENOUS

## 2018-01-23 MED ORDER — MIDAZOLAM HCL 2 MG/2ML IJ SOLN
INTRAMUSCULAR | Status: AC
  Filled 2018-01-23: qty 2

## 2018-01-23 MED ORDER — FENTANYL CITRATE (PF) 100 MCG/2ML IJ SOLN
INTRAMUSCULAR | Status: AC
  Filled 2018-01-23: qty 2

## 2018-01-23 MED ORDER — FENTANYL CITRATE (PF) 100 MCG/2ML IJ SOLN
INTRAMUSCULAR | Status: AC | PRN
  Administered 2018-01-23 (×2): 25 ug via INTRAVENOUS

## 2018-01-23 MED ORDER — SODIUM CHLORIDE 0.9 % IV SOLN
INTRAVENOUS | Status: DC
  Administered 2018-01-23: 09:00:00 via INTRAVENOUS

## 2018-01-23 MED ORDER — SELINEXOR (100 MG ONCE WEEKLY) 20 MG PO TBPK: 100.0000 mg | ORAL_TABLET | ORAL | 2 refills | Status: AC

## 2018-01-23 NOTE — Procedures (Signed)
Interventional Radiology Procedure Note  Procedure: CT guided bone marrow aspiration and biopsy  Complications: None  EBL: < 10 mL  Findings: Aspirate and core biopsy performed of bone marrow in right iliac bone.  Plan: Bedrest supine x 1 hrs  Dj Senteno T. Vaibhav Fogleman, M.D Pager:  [phone]   

## 2018-01-23 NOTE — H&P (Signed)
Referring Physician(s): [place] M/Ennever,P  Supervising Physician: Aletta Edouard  Patient Status:  [facility]  Chief Complaint:  "I'm having a bone marrow biopsy"  Subjective: Patient familiar to IR service from prior bone marrow biopsy on 01/23/2017 and Port-A-Cath placement on 09/19/2017.  She has a history of progressive multiple myeloma and presents again today for repeat CT-guided bone marrow biopsy for further evaluation. She denies fever, headache, chest pain, dyspnea, cough, abdominal pain, nausea, vomiting or bleeding.  She does have occasional back pain. Past Medical History:  Diagnosis Date  . Anemia of chronic renal failure, stage 3 (moderate) ([place]) 08/08/2017  . Cancer ([place])    multiple myeloma  . Counseling regarding goals of care 08/08/2017  . Hypertension    patient denies  . 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 Renal disorder    acute renal failure due to myeloma   Past Surgical History:  Procedure Laterality Date  . IR IMAGING GUIDED PORT INSERTION  09/19/2017  . NO PAST SURGERIES        Allergies: Patient has no known allergies.  Medications: Prior to Admission medications   Medication Sig Start Date End Date Taking? Authorizing Provider  famciclovir (FAMVIR) 250 MG tablet TAKE 1 TABLET BY MOUTH EVERY DAY 10/02/17  Yes Ennever, Rudell Cobb, MD  MAGNESIUM SULFATE PO Take 800 mg by mouth 2 (two) times daily.   Yes [provider]  Multiple Vitamin (MULTIVITAMIN WITH MINERALS) TABS tablet Take 1 tablet by mouth daily.   Yes [provider]  potassium chloride SA (K-DUR,KLOR-CON) 20 MEQ tablet Take 2 tablets (40 mEq total) by mouth daily. 01/05/18  Yes Ennever, Rudell Cobb, MD  Selinexor, 100 MG Once Weekly, 20 MG TBPK Take 100 mg by mouth once a week. 01/22/18  Yes Ennever, Rudell Cobb, MD  antiseptic oral rinse (BIOTENE) LIQD 15 mLs by Mouth Rinse route every 4 (four) hours. 10/17/17   Volanda Napoleon, MD  ciprofloxacin  (CIPRO) 500 MG tablet Take 1 tablet (500 mg total) by mouth daily with breakfast. 12/24/17   Volanda Napoleon, MD  fluconazole (DIFLUCAN) 100 MG tablet Take 1 tablet (100 mg total) by mouth daily. 12/24/17   Volanda Napoleon, MD  lidocaine-prilocaine (EMLA) cream Apply 1 application topically as needed. Apply to port one hour before appointment 09/01/17   Volanda Napoleon, MD  LORazepam (ATIVAN) 0.5 MG tablet Take 1 tablet (0.5 mg total) by mouth every 6 (six) hours as needed (Nausea or vomiting). Patient not taking: Reported on 01/05/2018 10/18/17   Volanda Napoleon, MD  ondansetron (ZOFRAN) 8 MG tablet Take 1 tablet (8 mg total) by mouth 2 (two) times daily as needed for refractory nausea / vomiting. Start on the second day after inpatient chemo completed. Patient not taking: Reported on 12/19/2017 10/18/17   Volanda Napoleon, MD  prochlorperazine (COMPAZINE) 10 MG tablet Take 1 tablet (10 mg total) by mouth every 6 (six) hours as needed (Nausea or vomiting). Patient not taking: Reported on 12/19/2017 10/18/17   Volanda Napoleon, MD  sodium bicarbonate/sodium chloride SOLN 1 application by Mouth Rinse route every 4 (four) hours. 12/24/17   Volanda Napoleon, MD     Vital Signs: BP 105/60 (BP Location: Right Arm)   Pulse (!) 104   Temp 97.8 F (36.6 C) (Oral)   Resp 18   SpO2 100%   Physical Exam awake, alert.  Chest clear to auscultation bilaterally.  Clean, intact right chest wall  Port-A-Cath.  Heart with regular rate and rhythm.  Abdomen soft, positive bowel sounds, nontender.  Bilateral lower extremity edema noted.  Imaging: No results found.  Labs:  CBC: Recent Labs    01/15/18 0845 01/17/18 1005 01/19/18 1027 01/22/18 0920  WBC 6.7 8.9 7.3 8.4  HGB 6.5* 6.7* 5.8* 6.2*  HCT 20.9* 21.5* 18.6* 19.2*  PLT 29* 33* 15* 8*    COAGS: Recent Labs    09/13/17 1044 09/19/17 1127 11/06/17 1225  INR 1.05 1.07 1.09  APTT  --   --  28    BMP: Recent Labs    12/21/17 0633  12/22/17 0601 12/23/17 0500 12/24/17 0637  01/15/18 0845 01/17/18 1005 01/19/18 1027 01/22/18 1120  NA 140 140 139 140   < > 140 135 134 139  K 4.5 4.7 4.8 4.8   < > 3.9 5.0* 4.8* 5.6*  CL 118* 120* 121* 116*   < > 116* 108 108 113*  CO2 13* 12* 13* 15*   < > 23 24 22 21   GLUCOSE 167* 184* 109* 111*   < > 113 119* 123* 120*  BUN 60* 61* 68* 72*   < > 19 19 30* 39*  CALCIUM 7.8* 7.8* 7.6* 8.1*   < > 9.5 9.6 9.4 10.9*  CREATININE 2.67* 2.23* 1.94* 1.82*   < > 1.60* 1.80* 2.90* 3.20*  GFRNONAA 17* 22* 26* 28*  --   --   --   --   --   GFRAA 20* 25* 30* 32*  --   --   --   --   --    < > = values in this interval not displayed.    LIVER FUNCTION TESTS: Recent Labs    01/15/18 0845 01/17/18 1005 01/19/18 1027 01/22/18 1120  BILITOT 0.5 0.5 0.5 0.6  AST 34 38 36 36  ALT 33 32 38 28  ALKPHOS 71 65 56 63  PROT 5.5* 5.9* 5.6* 5.9*  ALBUMIN 2.8* 3.0* 2.7* 2.7*    Assessment and Plan:  Pt with history of progressive multiple myeloma ; presents  today for  CT-guided bone marrow biopsy for further evaluation.Risks and benefits discussed with the patient/spouse including, but not limited to bleeding, infection, damage to adjacent structures or low yield requiring additional tests.  All of the patient's questions were answered, patient is agreeable to proceed. Consent signed and in chart.     Electronically Signed: D. Rowe Robert, PA-C 01/23/2018, 9:28 AM   I spent a total of 20 minutes at the the patient's bedside AND on the patient's hospital floor or unit, greater than 50% of which was counseling/coordinating care for CT guided bone marrow biopsy

## 2018-01-23 NOTE — Telephone Encounter (Signed)
Oral Oncology Pharmacist Encounter  Received new prescription for Xpovio (selinexor) for the treatment of refractory multiple myeloma in conjunction with dexamethasone, planned duration until disease progression or unacceptable drug toxicity.  BMP/CBC from 01/23/18 assessed, no relevant lab abnormalities. Prescription dose and frequency assessed. Patient starting on reduced dose of selinexor which is appropriate based on the hgb and pltc.  Current medication list in Epic reviewed, no relevant DDIs with selinexar identified.  Prescription has been e-scribed to the [place] for benefits analysis and approval.  Oral Oncology Clinic will continue to follow for insurance authorization, copayment issues, initial counseling and start date.  Darl Pikes, PharmD, BCPS, [facility] Hematology/Oncology Clinical Pharmacist [facility] [phone]  01/23/2018 10:12 AM

## 2018-01-23 NOTE — Discharge Instructions (Signed)
Moderate Conscious Sedation, Adult, Care After These instructions provide you with information about caring for yourself after your procedure. Your health care provider may also give you more specific instructions. Your treatment has been planned according to current medical practices, but problems sometimes occur. Call your health care provider if you have any problems or questions after your procedure. What can I expect after the procedure? After your procedure, it is common:  To feel sleepy for several hours.  To feel clumsy and have poor balance for several hours.  To have poor judgment for several hours.  To vomit if you eat too soon.  Follow these instructions at home: For at least 24 hours after the procedure:   Do not: ? Participate in activities where you could fall or become injured. ? Drive. ? Use heavy machinery. ? Drink alcohol. ? Take sleeping pills or medicines that cause drowsiness. ? Make important decisions or sign legal documents. ? Take care of children on your own.  Rest. Eating and drinking  Follow the diet recommended by your health care provider.  If you vomit: ? Drink water, juice, or soup when you can drink without vomiting. ? Make sure you have little or no nausea before eating solid foods. General instructions  Have a responsible adult stay with you until you are awake and alert.  Take over-the-counter and prescription medicines only as told by your health care provider.  If you smoke, do not smoke without supervision.  Keep all follow-up visits as told by your health care provider. This is important. Contact a health care provider if:  You keep feeling nauseous or you keep vomiting.  You feel light-headed.  You develop a rash.  You have a fever. Get help right away if:  You have trouble breathing. This information is not intended to replace advice given to you by your health care provider. Make sure you discuss any questions you have  with your health care provider. Document Released: 01/02/2013 Document Revised: 08/17/2015 Document Reviewed: 07/04/2015 Elsevier Interactive Patient Education  2018 [place] [employer]. Bone Marrow Aspiration and Bone Marrow Biopsy, Adult, Care After This sheet gives you information about how to care for yourself after your procedure. Your health care provider may also give you more specific instructions. If you have problems or questions, contact your health care provider. What can I expect after the procedure? After the procedure, it is common to have:  Mild pain and tenderness.  Swelling.  Bruising.  Follow these instructions at home:  Take over-the-counter or prescription medicines only as told by your health care provider.  Do not take baths, swim, or use a hot tub until your health care provider approves. Ask if you can take a shower or have a sponge bath.  Follow instructions from your health care provider about how to take care of the puncture site. Make sure you: ? Wash your hands with soap and water before you change your bandage (dressing). If soap and water are not available, use hand sanitizer. ? Change your dressing as told by your health care provider.  Check your puncture siteevery day for signs of infection. Check for: ? More redness, swelling, or pain. ? More fluid or blood. ? Warmth. ? Pus or a bad smell.  Return to your normal activities as told by your health care provider. Ask your health care provider what activities are safe for you.  Do not drive for 24 hours if you were given a medicine to help you relax (sedative).  Keep all follow-up visits as told by your health care provider. This is important. °Contact a health care provider if: °· You have more redness, swelling, or pain around the puncture site. °· You have more fluid or blood coming from the puncture site. °· Your puncture site feels warm to the touch. °· You have pus or a bad smell coming from the  puncture site. °· You have a fever. °· Your pain is not controlled with medicine. °This information is not intended to replace advice given to you by your health care provider. Make sure you discuss any questions you have with your health care provider. °Document Released: 10/01/2004 Document Revised: 10/02/2015 Document Reviewed: 08/26/2015 °Elsevier Interactive Patient Education © 2018 [employer] ° °

## 2018-01-24 ENCOUNTER — Other Ambulatory Visit: Payer: Self-pay | Admitting: Hematology & Oncology

## 2018-01-24 ENCOUNTER — Other Ambulatory Visit: Payer: Self-pay | Admitting: *Deleted

## 2018-01-24 ENCOUNTER — Encounter (HOSPITAL_COMMUNITY): Payer: Self-pay | Admitting: *Deleted

## 2018-01-24 ENCOUNTER — Inpatient Hospital Stay (HOSPITAL_COMMUNITY): Payer: Medicare Other

## 2018-01-24 ENCOUNTER — Inpatient Hospital Stay: Payer: Medicare Other

## 2018-01-24 ENCOUNTER — Inpatient Hospital Stay (HOSPITAL_COMMUNITY)
Admission: AD | Admit: 2018-01-24 | Discharge: 2018-02-25 | DRG: 840 | Disposition: E | Payer: Medicare Other | Attending: Pulmonary Disease | Admitting: Pulmonary Disease

## 2018-01-24 DIAGNOSIS — I5030 Unspecified diastolic (congestive) heart failure: Secondary | ICD-10-CM | POA: Diagnosis present

## 2018-01-24 DIAGNOSIS — D61818 Other pancytopenia: Secondary | ICD-10-CM | POA: Diagnosis present

## 2018-01-24 DIAGNOSIS — E874 Mixed disorder of acid-base balance: Secondary | ICD-10-CM | POA: Diagnosis not present

## 2018-01-24 DIAGNOSIS — D631 Anemia in chronic kidney disease: Secondary | ICD-10-CM | POA: Diagnosis present

## 2018-01-24 DIAGNOSIS — Z66 Do not resuscitate: Secondary | ICD-10-CM | POA: Diagnosis not present

## 2018-01-24 DIAGNOSIS — Z681 Body mass index (BMI) 19 or less, adult: Secondary | ICD-10-CM | POA: Diagnosis not present

## 2018-01-24 DIAGNOSIS — E440 Moderate protein-calorie malnutrition: Secondary | ICD-10-CM | POA: Diagnosis present

## 2018-01-24 DIAGNOSIS — G9341 Metabolic encephalopathy: Secondary | ICD-10-CM | POA: Diagnosis present

## 2018-01-24 DIAGNOSIS — C9000 Multiple myeloma not having achieved remission: Secondary | ICD-10-CM

## 2018-01-24 DIAGNOSIS — Z515 Encounter for palliative care: Secondary | ICD-10-CM

## 2018-01-24 DIAGNOSIS — I130 Hypertensive heart and chronic kidney disease with heart failure and stage 1 through stage 4 chronic kidney disease, or unspecified chronic kidney disease: Secondary | ICD-10-CM | POA: Diagnosis present

## 2018-01-24 DIAGNOSIS — E8770 Fluid overload, unspecified: Secondary | ICD-10-CM | POA: Diagnosis present

## 2018-01-24 DIAGNOSIS — R413 Other amnesia: Secondary | ICD-10-CM | POA: Diagnosis present

## 2018-01-24 DIAGNOSIS — R0603 Acute respiratory distress: Secondary | ICD-10-CM | POA: Diagnosis not present

## 2018-01-24 DIAGNOSIS — E878 Other disorders of electrolyte and fluid balance, not elsewhere classified: Secondary | ICD-10-CM | POA: Diagnosis not present

## 2018-01-24 DIAGNOSIS — N183 Chronic kidney disease, stage 3 (moderate): Secondary | ICD-10-CM | POA: Diagnosis present

## 2018-01-24 DIAGNOSIS — N179 Acute kidney failure, unspecified: Secondary | ICD-10-CM | POA: Diagnosis present

## 2018-01-24 DIAGNOSIS — D696 Thrombocytopenia, unspecified: Secondary | ICD-10-CM

## 2018-01-24 DIAGNOSIS — J9601 Acute respiratory failure with hypoxia: Secondary | ICD-10-CM | POA: Diagnosis not present

## 2018-01-24 DIAGNOSIS — E875 Hyperkalemia: Secondary | ICD-10-CM | POA: Diagnosis not present

## 2018-01-24 DIAGNOSIS — D649 Anemia, unspecified: Secondary | ICD-10-CM

## 2018-01-24 DIAGNOSIS — J9600 Acute respiratory failure, unspecified whether with hypoxia or hypercapnia: Secondary | ICD-10-CM

## 2018-01-24 DIAGNOSIS — D8989 Other specified disorders involving the immune mechanism, not elsewhere classified: Secondary | ICD-10-CM

## 2018-01-24 DIAGNOSIS — R0602 Shortness of breath: Secondary | ICD-10-CM

## 2018-01-24 DIAGNOSIS — Z95828 Presence of other vascular implants and grafts: Secondary | ICD-10-CM

## 2018-01-24 DIAGNOSIS — E872 Acidosis, unspecified: Secondary | ICD-10-CM

## 2018-01-24 DIAGNOSIS — Z807 Family history of other malignant neoplasms of lymphoid, hematopoietic and related tissues: Secondary | ICD-10-CM

## 2018-01-24 DIAGNOSIS — D808 Other immunodeficiencies with predominantly antibody defects: Secondary | ICD-10-CM | POA: Diagnosis not present

## 2018-01-24 DIAGNOSIS — D46Z Other myelodysplastic syndromes: Secondary | ICD-10-CM

## 2018-01-24 DIAGNOSIS — N189 Chronic kidney disease, unspecified: Secondary | ICD-10-CM | POA: Diagnosis not present

## 2018-01-24 DIAGNOSIS — F0390 Unspecified dementia without behavioral disturbance: Secondary | ICD-10-CM | POA: Diagnosis not present

## 2018-01-24 DIAGNOSIS — R0902 Hypoxemia: Secondary | ICD-10-CM

## 2018-01-24 LAB — CBC WITH DIFFERENTIAL (CANCER CENTER ONLY)
Abs Immature Granulocytes: 0.51 10*3/uL — ABNORMAL HIGH (ref 0.00–0.07)
BASOS ABS: 0 10*3/uL (ref 0.0–0.1)
Basophils Relative: 0 %
EOS PCT: 0 %
Eosinophils Absolute: 0 10*3/uL (ref 0.0–0.5)
HEMATOCRIT: 21.3 % — AB (ref 36.0–46.0)
Hemoglobin: 7.1 g/dL — ABNORMAL LOW (ref 12.0–15.0)
IMMATURE GRANULOCYTES: 6 %
LYMPHS PCT: 22 %
Lymphs Abs: 1.8 10*3/uL (ref 0.7–4.0)
MCH: 27.1 pg (ref 26.0–34.0)
MCHC: 33.3 g/dL (ref 30.0–36.0)
MCV: 81.3 fL (ref 80.0–100.0)
MONOS PCT: 28 %
Monocytes Absolute: 2.3 10*3/uL — ABNORMAL HIGH (ref 0.1–1.0)
NEUTROS PCT: 44 %
NRBC: 0 % (ref 0.0–0.2)
Neutro Abs: 3.4 10*3/uL (ref 1.7–7.7)
Platelet Count: 21 10*3/uL — ABNORMAL LOW (ref 150–400)
RBC: 2.62 MIL/uL — ABNORMAL LOW (ref 3.87–5.11)
RDW: 19.5 % — ABNORMAL HIGH (ref 11.5–15.5)
WBC Count: 8 10*3/uL (ref 4.0–10.5)

## 2018-01-24 LAB — CMP (CANCER CENTER ONLY)
ALBUMIN: 2.8 g/dL — AB (ref 3.5–5.0)
ALK PHOS: 73 U/L (ref 26–84)
ALT: 33 U/L (ref 10–47)
AST: 44 U/L — AB (ref 11–38)
Anion gap: 7 (ref 5–15)
BILIRUBIN TOTAL: 0.6 mg/dL (ref 0.2–1.6)
BUN: 53 mg/dL — ABNORMAL HIGH (ref 7–22)
CALCIUM: 10.6 mg/dL — AB (ref 8.0–10.3)
CO2: 19 mmol/L (ref 18–33)
CREATININE: 3.4 mg/dL — AB (ref 0.60–1.20)
Chloride: 111 mmol/L — ABNORMAL HIGH (ref 98–108)
Glucose, Bld: 127 mg/dL — ABNORMAL HIGH (ref 73–118)
Potassium: 4.5 mmol/L (ref 3.3–4.7)
Sodium: 137 mmol/L (ref 128–145)
Total Protein: 5.9 g/dL — ABNORMAL LOW (ref 6.4–8.1)

## 2018-01-24 MED ORDER — SODIUM CHLORIDE 0.9 % IV SOLN
INTRAVENOUS | Status: DC
  Administered 2018-01-24: 17:00:00 via INTRAVENOUS

## 2018-01-24 MED ORDER — SODIUM BICARBONATE/SODIUM CHLORIDE MOUTHWASH
1.0000 "application " | OROMUCOSAL | Status: DC
  Administered 2018-01-24 – 2018-01-28 (×18): 1 via OROMUCOSAL
  Filled 2018-01-24: qty 1000

## 2018-01-24 MED ORDER — BIOTENE DRY MOUTH MT LIQD
15.0000 mL | OROMUCOSAL | Status: DC
  Administered 2018-01-24 – 2018-01-29 (×25): 15 mL via OROMUCOSAL

## 2018-01-24 MED ORDER — LIDOCAINE-PRILOCAINE 2.5-2.5 % EX CREA
1.0000 "application " | TOPICAL_CREAM | CUTANEOUS | Status: DC | PRN
  Filled 2018-01-24: qty 5

## 2018-01-24 MED ORDER — FAMCICLOVIR 500 MG PO TABS
250.0000 mg | ORAL_TABLET | Freq: Every day | ORAL | Status: DC
  Administered 2018-01-25 – 2018-01-28 (×4): 250 mg via ORAL
  Filled 2018-01-24 (×6): qty 0.5

## 2018-01-24 MED ORDER — SODIUM CHLORIDE 0.9% FLUSH
10.0000 mL | INTRAVENOUS | Status: DC | PRN
  Filled 2018-01-24: qty 10

## 2018-01-24 MED ORDER — SODIUM CHLORIDE 0.9 % IJ SOLN
10.0000 mL | Freq: Once | INTRAMUSCULAR | Status: AC
  Administered 2018-01-24: 10 mL via INTRAVENOUS
  Filled 2018-01-24: qty 10

## 2018-01-24 MED ORDER — HEPARIN SOD (PORK) LOCK FLUSH 100 UNIT/ML IV SOLN
500.0000 [IU] | Freq: Once | INTRAVENOUS | Status: DC
  Filled 2018-01-24: qty 5

## 2018-01-24 NOTE — Progress Notes (Signed)
Pt is a risk  For falls and bleeding due to low platelets. Bed and chair alarms are on . PT has been instructed  nd agrees to call for help

## 2018-01-25 ENCOUNTER — Inpatient Hospital Stay: Payer: Medicare Other

## 2018-01-25 ENCOUNTER — Other Ambulatory Visit: Payer: Self-pay

## 2018-01-25 DIAGNOSIS — C9000 Multiple myeloma not having achieved remission: Principal | ICD-10-CM

## 2018-01-25 DIAGNOSIS — D808 Other immunodeficiencies with predominantly antibody defects: Secondary | ICD-10-CM

## 2018-01-25 LAB — CBC
HCT: 16.8 % — ABNORMAL LOW (ref 36.0–46.0)
Hemoglobin: 5.5 g/dL — CL (ref 12.0–15.0)
MCH: 27.2 pg (ref 26.0–34.0)
MCHC: 32.7 g/dL (ref 30.0–36.0)
MCV: 83.2 fL (ref 80.0–100.0)
NRBC: 0 % (ref 0.0–0.2)
PLATELETS: 10 10*3/uL — AB (ref 150–400)
RBC: 2.02 MIL/uL — AB (ref 3.87–5.11)
RDW: 19.6 % — ABNORMAL HIGH (ref 11.5–15.5)
WBC: 5.5 10*3/uL (ref 4.0–10.5)

## 2018-01-25 LAB — COMPREHENSIVE METABOLIC PANEL
ALT: 28 U/L (ref 0–44)
AST: 33 U/L (ref 15–41)
Albumin: 2.6 g/dL — ABNORMAL LOW (ref 3.5–5.0)
Alkaline Phosphatase: 52 U/L (ref 38–126)
Anion gap: 8 (ref 5–15)
BUN: 66 mg/dL — AB (ref 8–23)
CHLORIDE: 111 mmol/L (ref 98–111)
CO2: 18 mmol/L — ABNORMAL LOW (ref 22–32)
CREATININE: 3.89 mg/dL — AB (ref 0.44–1.00)
Calcium: 10.3 mg/dL (ref 8.9–10.3)
GFR calc Af Amer: 13 mL/min — ABNORMAL LOW (ref >60)
GFR calc non Af Amer: 11 mL/min — ABNORMAL LOW (ref >60)
Glucose, Bld: 90 mg/dL (ref 70–99)
Potassium: 4.4 mmol/L (ref 3.5–5.1)
SODIUM: 137 mmol/L (ref 135–145)
Total Bilirubin: 0.6 mg/dL (ref 0.3–1.2)
Total Protein: 4.9 g/dL — ABNORMAL LOW (ref 6.5–8.1)

## 2018-01-25 LAB — FERRITIN: FERRITIN: 8345 ng/mL — AB (ref 11–307)

## 2018-01-25 LAB — IRON AND TIBC
IRON: 78 ug/dL (ref 28–170)
SATURATION RATIOS: 41 % — AB (ref 10.4–31.8)
TIBC: 189 ug/dL — ABNORMAL LOW (ref 250–450)
UIBC: 111 ug/dL

## 2018-01-25 LAB — PREPARE RBC (CROSSMATCH)

## 2018-01-25 MED ORDER — FUROSEMIDE 10 MG/ML IJ SOLN
20.0000 mg | Freq: Once | INTRAMUSCULAR | Status: AC
  Administered 2018-01-25: 20 mg via INTRAVENOUS

## 2018-01-25 MED ORDER — SODIUM CHLORIDE 0.9% FLUSH: 10.0000 mL | INTRAVENOUS | Status: DC | PRN

## 2018-01-25 MED ORDER — SODIUM CHLORIDE 0.9 % IV SOLN
7.5000 mg/m2 | Freq: Once | INTRAVENOUS | Status: AC
  Administered 2018-01-25: 10 mg via INTRAVENOUS
  Filled 2018-01-25: qty 5

## 2018-01-25 MED ORDER — SODIUM CHLORIDE 0.9% IV SOLUTION: 250.0000 mL | Freq: Once | INTRAVENOUS | Status: DC

## 2018-01-25 MED ORDER — EPOETIN ALFA 20000 UNIT/ML IJ SOLN
40000.0000 [IU] | INTRAMUSCULAR | Status: DC
  Administered 2018-01-25: 40000 [IU] via SUBCUTANEOUS
  Filled 2018-01-25: qty 1

## 2018-01-25 MED ORDER — HEPARIN SOD (PORK) LOCK FLUSH 100 UNIT/ML IV SOLN
250.0000 [IU] | INTRAVENOUS | Status: DC | PRN
  Filled 2018-01-25: qty 2.5

## 2018-01-25 MED ORDER — METHYLPREDNISOLONE SODIUM SUCC 40 MG IJ SOLR
40.0000 mg | Freq: Once | INTRAMUSCULAR | Status: AC
  Administered 2018-01-25: 40 mg via INTRAVENOUS

## 2018-01-25 MED ORDER — SODIUM CHLORIDE 0.9 % IV SOLN: INTRAVENOUS | Status: DC

## 2018-01-25 MED ORDER — DEXAMETHASONE 4 MG PO TABS
40.0000 mg | ORAL_TABLET | Freq: Once | ORAL | Status: AC
  Administered 2018-01-25: 40 mg via ORAL
  Filled 2018-01-25: qty 10

## 2018-01-25 MED ORDER — POTASSIUM CHLORIDE 2 MEQ/ML IV SOLN
Freq: Once | INTRAVENOUS | Status: AC
  Administered 2018-01-25: 18:00:00 via INTRAVENOUS
  Filled 2018-01-25: qty 10

## 2018-01-25 MED ORDER — FUROSEMIDE 10 MG/ML IJ SOLN
20.0000 mg | Freq: Once | INTRAMUSCULAR | Status: AC
  Filled 2018-01-25: qty 2

## 2018-01-25 MED ORDER — PALONOSETRON HCL INJECTION 0.25 MG/5ML
0.2500 mg | Freq: Once | INTRAVENOUS | Status: AC
  Administered 2018-01-25: 0.25 mg via INTRAVENOUS
  Filled 2018-01-25: qty 5

## 2018-01-25 MED ORDER — SODIUM CHLORIDE 0.9% FLUSH: 3.0000 mL | INTRAVENOUS | Status: DC | PRN

## 2018-01-25 MED ORDER — BORTEZOMIB CHEMO IV INJECTION 3.5 MG(1MG/ML)
1.3000 mg/m2 | Freq: Once | INTRAMUSCULAR | Status: AC
  Administered 2018-01-25: 1.8 mg via INTRAVENOUS
  Filled 2018-01-25: qty 1.8

## 2018-01-25 MED ORDER — HEPARIN SOD (PORK) LOCK FLUSH 100 UNIT/ML IV SOLN
500.0000 [IU] | Freq: Every day | INTRAVENOUS | Status: DC | PRN
  Filled 2018-01-25: qty 5

## 2018-01-25 MED ORDER — ACETAMINOPHEN 325 MG PO TABS
650.0000 mg | ORAL_TABLET | Freq: Once | ORAL | Status: AC
  Administered 2018-01-25: 650 mg via ORAL

## 2018-01-25 MED ORDER — SODIUM CHLORIDE 0.9 % IV SOLN
Freq: Once | INTRAVENOUS | Status: AC
  Administered 2018-01-25: 18:00:00 via INTRAVENOUS
  Filled 2018-01-25: qty 5

## 2018-01-25 MED ORDER — SODIUM CHLORIDE 0.9 % IV SOLN
Freq: Once | INTRAVENOUS | Status: AC
  Administered 2018-01-25: 7 mg via INTRAVENOUS
  Filled 2018-01-25: qty 7

## 2018-01-25 MED ORDER — SODIUM CHLORIDE 0.9% IV SOLUTION
Freq: Once | INTRAVENOUS | Status: AC
  Administered 2018-01-25: 17:00:00 via INTRAVENOUS

## 2018-01-25 MED ORDER — SODIUM CHLORIDE 0.9% IV SOLUTION: Freq: Once | INTRAVENOUS | Status: DC

## 2018-01-25 MED ORDER — SODIUM CHLORIDE 0.9% IV SOLUTION
Freq: Once | INTRAVENOUS | Status: AC
  Administered 2018-01-25: 13:00:00 via INTRAVENOUS

## 2018-01-25 NOTE — Progress Notes (Signed)
CRITICAL VALUE ALERT  Critical Value: hgb 5.5 / plt 10  Date & Time Notied:  01/25/18 5:55am  Provider Notified: Irene Limbo, oncologist on-call  Orders Received/Actions taken: pending

## 2018-01-25 NOTE — Progress Notes (Signed)
Oncology Short note  Called about patient AM labs by RN. Hgb down to 5.5 and platelets of 10k. Patient has been transfusion dependent recently. Informed Dr Jonette Eva about the labs. Ordered PRBC and PLT transfusion. Dr Marin Olp to continue inpatient followup.  Sullivan Lone MD MS

## 2018-01-25 NOTE — Progress Notes (Signed)
Notified by Jenny Reichmann, RN for inpatient unit reporting pts UOP = 200 mL I have contacted Dr. Marin Olp and he gave ok for tx today w/ UOP = 200 mL Kennith Center, Pharm.D., CPP 01/25/2018@3 :29 PM

## 2018-01-25 NOTE — H&P (Signed)
Referral MD  Reason for Referral: Refractory light chain myeloma-chemotherapy  No chief complaint on file. : I need more chemotherapy.  HPI: [name] is a very charming 67 year old white female.  She is well-known to me.  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  She has had VD-PACE in the past.  She has had 3 cycles to date.  She is awaiting potential clinical trial with a monoclonal antibody down in [place].  However, we cannot get her to that trial yet as her renal function is declining.  Her renal function is declining because her light chains are going up very quickly.  A week or so ago, her light chain level was 3000.  3 days ago, the light chain level was 23,000.  When this happens, her renal function starts to decline very quickly.  She is now being admitted so that we can give her chemotherapy to try to bring down her light chain level to improve her renal function so that she can get onto clinical trial.  She has had no problems with treatment.  Despite the profound pancytopenia that she developed, she has had very little in the way of toxicity.  She has had no bleeding.  She is had weight loss which happens when her renal function declines.  She has short-term memory loss.  She just does not like to eat when her renal function declines.  Her labs this morning show white cell count of 5.5.  Hemoglobin 5.5.  Platelet count 10,000.  Her creatinine is 3.9.  BUN is 66.  Potassium 4.4.  She has had no cough.  Overall, her performance status is ECOG 1-2.   Past Medical History:  Diagnosis Date  . Anemia of chronic renal failure, stage 3 (moderate) ([place]) 08/08/2017  . Cancer ([place])    multiple myeloma  . Counseling regarding goals of care 08/08/2017  . Hypertension    patient denies  . Kappa light chain deposition disease ([place]) 08/08/2017  . Renal disorder    acute renal failure due to myeloma  :  Past Surgical History:  Procedure  Laterality Date  . IR IMAGING GUIDED PORT INSERTION  09/19/2017  . NO PAST SURGERIES    :   Current Facility-Administered Medications:  .  0.9 %  sodium chloride infusion (Manually program via Guardrails IV Fluids), 250 mL, Intravenous, Once, Kale, Cloria Spring, MD .  0.9 %  sodium chloride infusion, , Intravenous, Continuous, Ron Beske, Rudell Cobb, MD, Last Rate: 50 mL/hr at 01/25/18 0231 .  acetaminophen (TYLENOL) tablet 650 mg, 650 mg, Oral, Once, Brunetta Genera, MD .  antiseptic oral rinse (BIOTENE) solution 15 mL, 15 mL, Mouth Rinse, Q4H, Detrell Umscheid, Rudell Cobb, MD, 15 mL at 01/25/18 0404 .  famciclovir (FAMVIR) tablet 250 mg, 250 mg, Oral, Daily, Daija Routson R, MD .  furosemide (LASIX) injection 20 mg, 20 mg, Intravenous, Once, Kale, Cloria Spring, MD .  heparin lock flush 100 unit/mL, 500 Units, Intracatheter, Daily PRN, Irene Limbo, Cloria Spring, MD .  heparin lock flush 100 unit/mL, 250 Units, Intracatheter, PRN, Irene Limbo, Cloria Spring, MD .  lidocaine-prilocaine (EMLA) cream 1 application, 1 application, Topical, PRN, Volanda Napoleon, MD .  methylPREDNISolone sodium succinate (SOLU-MEDROL) 40 mg/mL injection 40 mg, 40 mg, Intravenous, Once, Brunetta Genera, MD .  sodium bicarbonate/sodium chloride mouthwash [id number], 1 application, Mouth Rinse, Q4H while awake, Volanda Napoleon, MD, 1 application at 03/47/42 0515 .  sodium chloride flush (NS) 0.9 %  injection 10 mL, 10 mL, Intracatheter, PRN, Irene Limbo, Cloria Spring, MD .  sodium chloride flush (NS) 0.9 % injection 3 mL, 3 mL, Intracatheter, PRN, Irene Limbo, Cloria Spring, MD  Facility-Administered Medications Ordered in Other Encounters:  .  sodium chloride flush (NS) 0.9 % injection 10 mL, 10 mL, Intravenous, PRN, [place], Sarah M, NP, 10 mL at 12/04/17 1201:  . sodium chloride  250 mL Intravenous Once  . acetaminophen  650 mg Oral Once  . antiseptic oral rinse  15 mL Mouth Rinse Q4H  . famciclovir  250 mg Oral Daily  . furosemide  20  mg Intravenous Once  . methylPREDNISolone (SOLU-MEDROL) injection  40 mg Intravenous Once  . sodium bicarbonate/sodium chloride  1 application Mouth Rinse Q4H while awake  :  No Known Allergies:  Family History  Problem Relation Age of Onset  . Multiple myeloma Mother   . Cancer Father   :  Social History   Socioeconomic History  . Marital status: Married    Spouse name: Not on file  . Number of children: Not on file  . Years of education: Not on file  . Highest education level: Not on file  Occupational History  . Occupation: retired    Comment: Designer, television/film set  Social Needs  . Financial resource strain: Not on file  . Food insecurity:    Worry: Not on file    Inability: Not on file  . Transportation needs:    Medical: Not on file    Non-medical: Not on file  Tobacco Use  . Smoking status: Never Smoker  . Smokeless tobacco: Never Used  Substance and Sexual Activity  . Alcohol use: No  . Drug use: No  . Sexual activity: Not on file  Lifestyle  . Physical activity:    Days per week: Not on file    Minutes per session: Not on file  . Stress: Not on file  Relationships  . Social connections:    Talks on phone: Not on file    Gets together: Not on file    Attends religious service: Not on file    Active member of club or organization: Not on file    Attends meetings of clubs or organizations: Not on file    Relationship status: Not on file  . Intimate partner violence:    Fear of current or ex partner: Not on file    Emotionally abused: Not on file    Physically abused: Not on file    Forced sexual activity: Not on file  Other Topics Concern  . Not on file  Social History Narrative  . Not on file  :  Pertinent items are noted in HPI.  Exam: Thin white female in no obvious distress.  Head neck exam shows some slight temporal muscle wasting.  She has no mucositis.  There is no thrush.  She has no adenopathy in the neck.  Lungs are clear.  Cardiac exam  regular rate and rhythm.  She has no murmurs, rubs or bruits.  Abdomen is soft.  She has no fluid wave.  There is no palpable liver or spleen tip.  Extremities shows mild chronic pitting edema in her lower legs and feet.  Skin exam shows some scattered ecchymoses.  Neurological exam shows the chronic short-term memory loss. Patient Vitals for the past 24 hrs:  BP Temp Temp src Pulse Resp SpO2 Height  01/25/18 0401 (!) 95/58 98.1 F (36.7 C) Oral (!) 102 18 94 % -  01/09/2018 2108 (!) 101/49 97.9 F (36.6 C) Oral 99 18 100 % -  12/26/2017 1552 (!) 93/58 97.7 F (36.5 C) Oral (!) 117 16 98 % _0  (1.6 m)     Recent Labs    12/30/2017 1017 01/25/18 0518  WBC 8.0 5.5  HGB 7.1* 5.5*  HCT 21.3* 16.8*  PLT 21* 10*   Recent Labs    12/31/2017 1017 01/25/18 0518  NA 137 137  K 4.5 4.4  CL 111* 111  CO2 19 18*  GLUCOSE 127* 90  BUN 53* 66*  CREATININE 3.40* 3.89*  CALCIUM 10.6* 10.3    Blood smear review: None  Pathology: None    Assessment and Plan: Ms. Higinbotham is a 68 year old white female.  She has refractory kappa light chain myeloma.  She really is getting close to not being able to have any further therapy.  Hopefully, she will be able to get onto this clinical trial.  Her renal function has to improve.  It is consistent with her in which her renal function declines as her light chains go up.  Her light chains will continue to go up very quickly.  I am afraid that without therapy, she would develop renal failure and she is not a good dialysis candidate by any means.  She tends to respond very quickly to treatment.  With that, we can try to get her onto the clinical trial.  She will need to have another IV placed.  We will see about a PICC line for her so that we can give blood products while she is getting infusional chemotherapy.  She is trying her best.  The weight loss is quite troublesome to me.  Hopefully, while in the hospital, she will be able to eat a little bit  better.  She has always tolerated treatment well.  We will proceed with treatment today.  Despite her low blood counts, we have to get started with treatment.  We will support her with transfusions.  She does get antibiotic prophylaxis with chemotherapy.  I know that the staff on [address] will do a fantastic job with her.  Lattie Haw, MD  Hubbard Robinson 1:7

## 2018-01-25 NOTE — Progress Notes (Signed)
Primary RN will call MD regarding PICC placement, RN to update IV team if needed.

## 2018-01-26 ENCOUNTER — Inpatient Hospital Stay: Payer: Medicare Other

## 2018-01-26 ENCOUNTER — Telehealth: Payer: Self-pay | Admitting: Pharmacy Technician

## 2018-01-26 DIAGNOSIS — E440 Moderate protein-calorie malnutrition: Secondary | ICD-10-CM

## 2018-01-26 LAB — CBC WITH DIFFERENTIAL/PLATELET
ABS IMMATURE GRANULOCYTES: 1.02 10*3/uL — AB (ref 0.00–0.07)
Basophils Absolute: 0 10*3/uL (ref 0.0–0.1)
Basophils Relative: 0 %
EOS ABS: 0 10*3/uL (ref 0.0–0.5)
Eosinophils Relative: 0 %
HCT: 26.7 % — ABNORMAL LOW (ref 36.0–46.0)
HEMOGLOBIN: 8.8 g/dL — AB (ref 12.0–15.0)
IMMATURE GRANULOCYTES: 12 %
LYMPHS PCT: 40 %
Lymphs Abs: 3.5 10*3/uL (ref 0.7–4.0)
MCH: 28 pg (ref 26.0–34.0)
MCHC: 33 g/dL (ref 30.0–36.0)
MCV: 85 fL (ref 80.0–100.0)
Monocytes Absolute: 0.6 10*3/uL (ref 0.1–1.0)
Monocytes Relative: 7 %
NEUTROS ABS: 3.7 10*3/uL (ref 1.7–7.7)
Neutrophils Relative %: 41 %
Platelets: 20 10*3/uL — CL (ref 150–400)
RBC: 3.14 MIL/uL — ABNORMAL LOW (ref 3.87–5.11)
RDW: 19 % — ABNORMAL HIGH (ref 11.5–15.5)
WBC: 8.3 10*3/uL (ref 4.0–10.5)
nRBC: 0 % (ref 0.0–0.2)

## 2018-01-26 LAB — COMPREHENSIVE METABOLIC PANEL
ALBUMIN: 2.5 g/dL — AB (ref 3.5–5.0)
ALT: 30 U/L (ref 0–44)
ANION GAP: 10 (ref 5–15)
AST: 30 U/L (ref 15–41)
Alkaline Phosphatase: 67 U/L (ref 38–126)
BUN: 74 mg/dL — AB (ref 8–23)
CHLORIDE: 110 mmol/L (ref 98–111)
CO2: 15 mmol/L — AB (ref 22–32)
Calcium: 9.8 mg/dL (ref 8.9–10.3)
Creatinine, Ser: 4.32 mg/dL — ABNORMAL HIGH (ref 0.44–1.00)
GFR calc Af Amer: 11 mL/min — ABNORMAL LOW (ref >60)
GFR calc non Af Amer: 10 mL/min — ABNORMAL LOW (ref >60)
GLUCOSE: 244 mg/dL — AB (ref 70–99)
POTASSIUM: 5 mmol/L (ref 3.5–5.1)
SODIUM: 135 mmol/L (ref 135–145)
Total Bilirubin: 0.6 mg/dL (ref 0.3–1.2)
Total Protein: 5.1 g/dL — ABNORMAL LOW (ref 6.5–8.1)

## 2018-01-26 LAB — PREPARE PLATELET PHERESIS: Unit division: 0

## 2018-01-26 LAB — BPAM PLATELET PHERESIS
Blood Product Expiration Date: 201910312359
ISSUE DATE / TIME: 201910311619
UNIT TYPE AND RH: 6200

## 2018-01-26 MED ORDER — DEXAMETHASONE 6 MG PO TABS: 40.0000 mg | ORAL_TABLET | Freq: Once | ORAL | Status: DC

## 2018-01-26 MED ORDER — SODIUM CHLORIDE 0.9 % IV SOLN
7.5000 mg/m2 | Freq: Once | INTRAVENOUS | Status: DC
  Filled 2018-01-26: qty 5

## 2018-01-26 MED ORDER — PALONOSETRON HCL INJECTION 0.25 MG/5ML
0.2500 mg | Freq: Once | INTRAVENOUS | Status: AC
  Administered 2018-01-27: 0.25 mg via INTRAVENOUS
  Filled 2018-01-26: qty 5

## 2018-01-26 MED ORDER — ADULT MULTIVITAMIN W/MINERALS CH
1.0000 | ORAL_TABLET | Freq: Every day | ORAL | Status: DC
  Administered 2018-01-26 – 2018-01-29 (×4): 1 via ORAL
  Filled 2018-01-26 (×4): qty 1

## 2018-01-26 MED ORDER — SODIUM CHLORIDE 0.9 % IV SOLN
Freq: Once | INTRAVENOUS | Status: DC
  Filled 2018-01-26: qty 7

## 2018-01-26 MED ORDER — SODIUM CHLORIDE 0.9 % IV SOLN
Freq: Once | INTRAVENOUS | Status: AC
  Administered 2018-01-27: 7 mg via INTRAVENOUS
  Filled 2018-01-26: qty 7

## 2018-01-26 MED ORDER — SODIUM CHLORIDE 0.9 % IV SOLN
7.5000 mg/m2 | Freq: Once | INTRAVENOUS | Status: AC
  Administered 2018-01-26: 10 mg via INTRAVENOUS
  Filled 2018-01-26: qty 5

## 2018-01-26 MED ORDER — SODIUM CHLORIDE 0.9 % IV SOLN
7.5000 mg/m2 | Freq: Once | INTRAVENOUS | Status: AC
  Administered 2018-01-27: 10 mg via INTRAVENOUS
  Filled 2018-01-26: qty 5

## 2018-01-26 MED ORDER — SODIUM CHLORIDE 0.9 % IV SOLN
Freq: Once | INTRAVENOUS | Status: AC
  Administered 2018-01-26: 595 mg via INTRAVENOUS
  Filled 2018-01-26: qty 7

## 2018-01-26 MED ORDER — POTASSIUM CHLORIDE 2 MEQ/ML IV SOLN
Freq: Once | INTRAVENOUS | Status: DC
  Filled 2018-01-26: qty 10

## 2018-01-26 MED ORDER — DEXAMETHASONE 4 MG PO TABS
40.0000 mg | ORAL_TABLET | Freq: Once | ORAL | Status: AC
  Administered 2018-01-26: 40 mg via ORAL
  Filled 2018-01-26: qty 10

## 2018-01-26 MED ORDER — ENSURE ENLIVE PO LIQD: 237.0000 mL | ORAL | Status: DC

## 2018-01-26 MED ORDER — POTASSIUM CHLORIDE 2 MEQ/ML IV SOLN
Freq: Once | INTRAVENOUS | Status: AC
  Administered 2018-01-26: 17:00:00 via INTRAVENOUS
  Filled 2018-01-26: qty 10

## 2018-01-26 MED ORDER — DEXAMETHASONE 4 MG PO TABS
40.0000 mg | ORAL_TABLET | Freq: Once | ORAL | Status: AC
  Administered 2018-01-27: 40 mg via ORAL
  Filled 2018-01-26: qty 10

## 2018-01-26 MED ORDER — POTASSIUM CHLORIDE 2 MEQ/ML IV SOLN
Freq: Once | INTRAVENOUS | Status: AC
  Administered 2018-01-27: 18:00:00 via INTRAVENOUS
  Filled 2018-01-26 (×2): qty 10

## 2018-01-26 NOTE — Progress Notes (Addendum)
Initial Nutrition Assessment  DOCUMENTATION CODES:   Non-severe (moderate) malnutrition in context of chronic illness, Underweight  INTERVENTION:  - Will order Ensure Enlive once/day, this supplement provides 350 kcal and 20 grams of protein. - Will order daily multivitamin with minerals.  - Continue to encourage PO intakes.    NUTRITION DIAGNOSIS:   Moderate Malnutrition related to chronic illness, catabolic illness, cancer and cancer related treatments as evidenced by moderate fat depletion, moderate muscle depletion.  GOAL:   Patient will meet greater than or equal to 90% of their needs  MONITOR:   PO intake, Supplement acceptance, Weight trends, Labs  REASON FOR ASSESSMENT:   Malnutrition Screening Tool  ASSESSMENT:    Jeanne Martinez with refractory light chain myeloma. She has had VD-PACE in the past. She has had 3 cycles to date. She is awaiting potential clinical trial with a monoclonal antibody in [place].  She was admitted in order to give her chemo in an effort to try to bring down her light chain level to improve her renal function so that she can get onto clinical trial.  Per chart review, patient consumed 50% of breakfast and 100% of lunch yesterday and 50% of breakfast this AM. Patient recalls eating an English muffin but is unable to recall what else she had. She reports that since beginning chemo she has had lack of taste/bland taste and things simply tasting different than usual (gives the example of previously loving chocolate but now the flavor is unappealing).   Patient reports that foods she can eat well are roast beef with mashed potatoes, [place] foods, and [place] foods. She typically eats meals but also nibbles throughout the day. She denies any chewing or swallowing issues. She denies any changes in BMs or any abdominal pain or nausea while undergoing chemo.  Patient states "I may have" when asked about weight loss. She is unable to give further  details on this. Per chart review, current weight is 90 lb and she weighed 98 lb on 11/13/17. This indicates 8 lb weight loss (8% body weight) in the past 2 months; this is significant for time frame. Degree of wasting may be more severe, but patient was not overly interested in NFPE being done and so results may not be truly reflective.    Medications reviewed; 20 mg IV Lasix x2 doses yesterday, 40 mg Solu-medrol x1 dose yesterday, Swish and Spit QID.  Labs reviewed; BUN: 74 mg/dL, creatinine: 4.32 mg/dL, GFR: 10 mL/min.  IVF; NS @ 50 mL/hr.       NUTRITION - FOCUSED PHYSICAL EXAM:    Most Recent Value  Orbital Region  Mild depletion  Upper Arm Region  Moderate depletion  Thoracic and Lumbar Region  Unable to assess  Buccal Region  Mild depletion  Temple Region  Mild depletion  Clavicle Bone Region  Moderate depletion  Clavicle and Acromion Bone Region  Moderate depletion  Scapular Bone Region  Unable to assess  Dorsal Hand  Mild depletion  Patellar Region  Unable to assess  Anterior Thigh Region  Unable to assess  Posterior Calf Region  Unable to assess  Edema (RD Assessment)  Unable to assess  Hair  Reviewed  Eyes  Reviewed  Mouth  Reviewed  Skin  Reviewed  Nails  Reviewed       Diet Order:   Diet Order            Diet regular Room service appropriate? Yes; Fluid consistency: Thin  Diet effective now  EDUCATION NEEDS:   No education needs have been identified at this time  Skin:  Skin Assessment: Reviewed RN Assessment  Last BM:  10/31  Height:   Ht Readings from Last 1 Encounters:  01/13/2018 5\' 3"  (1.6 m)    Weight:   Wt Readings from Last 1 Encounters:  01/26/18 40.7 kg    Ideal Body Weight:  52.27 kg  BMI:  Body mass index is 15.89 kg/m.  Estimated Nutritional Needs:   Kcal:  1630-1830 (40-45 kcal/kg)  Protein:  70-80 grams (1.7-2 grams/kg)  Fluid:  >/= 1.5 L/day     Jarome Matin, MS, RD, LDN, [facility] Inpatient Clinical  Dietitian Pager # [phone] After hours/weekend pager # [phone]

## 2018-01-26 NOTE — Progress Notes (Addendum)
So far, there is been no issues with respect to Jeanne Martinez and treatment.  She started her treatment.  To no surprise, the staff on [address] have done a fantastic job getting treatment started so quickly.  She got some platelets.  She got a unit of blood.  The bone marrow result that she had from Tuesday shows essentially marrow replacement by myeloma.  We will have the cytogenetics or FISH back until next week.  Her creatinine is 4.32.  She does have mild renal insufficiency which has clearly gotten worse.  There is acute renal failure is secondary to light chain deposition in her kidneys.  It is clearly on the way up.  This is no surprise given the light chain level.  I would like to hope that by Monday, we will start to see the creatinine improve.  She still has the short-term memory issues.  She says that she has been eating.  She does have moderate malnutrition.  A lot of this is from her just not remembering to eat.  It is hard to say if that is the case.  She is had no bleeding.  She is had no pain.  She had no nausea or vomiting.  There is been no fever.  Her blood sugar is 244.  I am really not too worried about this.  This is from the steroids.  Her BUN is 74.  Some of this is also from the Decadron.  Her calcium is better at 9.8.  She has normal liver function studies.  Her potassium is 5.  Her white cell count is 8.3.  Hemoglobin 8.8 and platelet count 20,000.  Her vital signs are temperature of 97.9.  Pulse 96.  Blood pressure 102/59.  Head neck exam shows no ocular or oral lesions.  There are no palpable cervical or supra clavicular lymph nodes.  Lungs are clear bilaterally.  Cardiac exam regular rate and rhythm.  Abdomen is soft.  Extremities shows no increase in mild edema in her lower legs and feet.  She has decent range of motion of her joints.  Neurological exam shows the short-term memory issues but this is chronic.  Jeanne Martinez has refractory light chain myeloma.  She is  on VD-PACE just to try to improve her renal function so she can get on clinical trial for compassionate use of a by specific antibody therapy.  Again, I would like to hope that we will start to see her renal function improve by Monday.  I very much appreciate the incredible care that the staff on [address] is giving her.  They are so compassionate and are very attentive to her memory problems.  I want to make sure that she rinses her mouth out with mouth rinse so that mucositis will not be a problem.  Lattie Haw, MD  Darlyn Chamber 33:3

## 2018-01-26 NOTE — Telephone Encounter (Signed)
Oral Oncology Patient Advocate Encounter  Received a call from [place] at [employer] regarding the status of Xpovio.  Patrice stated that they spoke to the patients husband and that he said he would contact them in 1-2 weeks to set up shipment.  Rosamaria Lints is on hold at [employer] and they will fill when patient calls back.   Patrice said that the patients copay was $282 after [employer] (grant assistance) was applied.  The grant did not have enough to cover the full amount of the drug.  She has been signed up for more assistance but it does not start until 02/21/18.  I emailed Patrice about 2 other assistance funds that are open and she said they will apply for assistance with the [employer].   [place] Patient [place] Phone [phone] Fax [phone] 01/26/2018 11:24 AM

## 2018-01-26 NOTE — Progress Notes (Signed)
See today's progess notes from Dr. Marin Olp on today's creatinine and platelet levels.  Henreitta Leber, PharmD

## 2018-01-26 DEATH — deceased

## 2018-01-27 ENCOUNTER — Inpatient Hospital Stay (HOSPITAL_COMMUNITY): Payer: Medicare Other

## 2018-01-27 LAB — COMPREHENSIVE METABOLIC PANEL
ALK PHOS: 59 U/L (ref 38–126)
ALT: 27 U/L (ref 0–44)
ANION GAP: 10 (ref 5–15)
AST: 31 U/L (ref 15–41)
Albumin: 2.2 g/dL — ABNORMAL LOW (ref 3.5–5.0)
BUN: 85 mg/dL — ABNORMAL HIGH (ref 8–23)
CALCIUM: 9.4 mg/dL (ref 8.9–10.3)
CO2: 13 mmol/L — AB (ref 22–32)
CREATININE: 4.48 mg/dL — AB (ref 0.44–1.00)
Chloride: 114 mmol/L — ABNORMAL HIGH (ref 98–111)
GFR calc Af Amer: 11 mL/min — ABNORMAL LOW (ref >60)
GFR calc non Af Amer: 9 mL/min — ABNORMAL LOW (ref >60)
Glucose, Bld: 175 mg/dL — ABNORMAL HIGH (ref 70–99)
Potassium: 5.1 mmol/L (ref 3.5–5.1)
SODIUM: 137 mmol/L (ref 135–145)
Total Bilirubin: 0.4 mg/dL (ref 0.3–1.2)
Total Protein: 4.6 g/dL — ABNORMAL LOW (ref 6.5–8.1)

## 2018-01-27 LAB — CBC WITH DIFFERENTIAL/PLATELET
ABS IMMATURE GRANULOCYTES: 1.22 10*3/uL — AB (ref 0.00–0.07)
BASOS ABS: 0 10*3/uL (ref 0.0–0.1)
Basophils Relative: 0 %
Eosinophils Absolute: 0 10*3/uL (ref 0.0–0.5)
Eosinophils Relative: 0 %
HCT: 23.9 % — ABNORMAL LOW (ref 36.0–46.0)
HEMOGLOBIN: 7.7 g/dL — AB (ref 12.0–15.0)
IMMATURE GRANULOCYTES: 10 %
LYMPHS ABS: 2.6 10*3/uL (ref 0.7–4.0)
Lymphocytes Relative: 22 %
MCH: 27.9 pg (ref 26.0–34.0)
MCHC: 32.2 g/dL (ref 30.0–36.0)
MCV: 86.6 fL (ref 80.0–100.0)
MONO ABS: 2.1 10*3/uL — AB (ref 0.1–1.0)
Monocytes Relative: 17 %
NRBC: 0 % (ref 0.0–0.2)
Neutro Abs: 6 10*3/uL (ref 1.7–7.7)
Neutrophils Relative %: 51 %
Platelets: 9 10*3/uL — CL (ref 150–400)
RBC: 2.76 MIL/uL — AB (ref 3.87–5.11)
RDW: 20.2 % — ABNORMAL HIGH (ref 11.5–15.5)
WBC: 11.9 10*3/uL — ABNORMAL HIGH (ref 4.0–10.5)

## 2018-01-27 MED ORDER — FUROSEMIDE 10 MG/ML IJ SOLN
40.0000 mg | Freq: Once | INTRAMUSCULAR | Status: AC
  Administered 2018-01-27: 40 mg via INTRAVENOUS
  Filled 2018-01-27: qty 4

## 2018-01-27 MED ORDER — SODIUM CHLORIDE 0.9% IV SOLUTION
Freq: Once | INTRAVENOUS | Status: AC
  Administered 2018-01-27: 14:00:00 via INTRAVENOUS

## 2018-01-27 NOTE — Progress Notes (Signed)
Jeanne Martinez continues to do well with chemotherapy.  As always, she just really never has much of a problem with this.  She does have a drop in her platelet count.  I will go ahead and give her a unit of platelets.  Her renal function still has not yet improved.  Her BUN is 85 and creatinine 4.48.  Her calcium is coming down.  Her blood sugar is on the higher side because of the Decadron.  Her white cell count is 11.9 and hemoglobin is 7.7.  It is still hard to tell if she is eating much.  Her intake and output put is 2.6 L ahead.  I probably will order a chest x-ray on her tomorrow.  We had to be careful with volume overload.  She does respond well to Lasix if necessary.  There is no obvious nausea or vomiting.  She is having no bleeding.  There is no reported diarrhea.  She is rinsing her mouth out.  On her physical exam, her vital signs show temperature of 97.7.  Pulse 87.  Blood pressure 124/70.  Her head and neck exam shows no mucositis.  There is no adenopathy in the neck.  Lungs are clear bilaterally.  Cardiac exam regular rate and rhythm.  Abdomen is soft.  She has decent bowel sounds.  There is no guarding or rebound tenderness.  Extremities shows no increase in her mild edema in the lower legs and feet.  Skin exam shows no rashes.  Neurological exam shows a chronic memory issues.  [name] has refractory kappa light chain myeloma.  She had impending renal failure and we brought her in for emergent chemotherapy with VD-PACE.  I would like to think that we will see her renal function began to improve on Monday.  Again, we will have to watch her intake and output.  We will give her some platelets today.  I appreciate the great care that she is getting from the staff on 6 E.  She is incredibly complicated with a very very tough medical problem.  However, the staff is doing a wonderful job with her and showing such compassion with her and trying to work with her memory  issues.  Lattie Haw, MD  1 [place] 4:16

## 2018-01-28 ENCOUNTER — Other Ambulatory Visit: Payer: Self-pay

## 2018-01-28 ENCOUNTER — Inpatient Hospital Stay (HOSPITAL_COMMUNITY): Payer: Medicare Other

## 2018-01-28 DIAGNOSIS — N189 Chronic kidney disease, unspecified: Secondary | ICD-10-CM

## 2018-01-28 DIAGNOSIS — D649 Anemia, unspecified: Secondary | ICD-10-CM

## 2018-01-28 DIAGNOSIS — N183 Chronic kidney disease, stage 3 (moderate): Secondary | ICD-10-CM

## 2018-01-28 DIAGNOSIS — E875 Hyperkalemia: Secondary | ICD-10-CM

## 2018-01-28 DIAGNOSIS — R0603 Acute respiratory distress: Secondary | ICD-10-CM

## 2018-01-28 DIAGNOSIS — F0390 Unspecified dementia without behavioral disturbance: Secondary | ICD-10-CM

## 2018-01-28 DIAGNOSIS — E8770 Fluid overload, unspecified: Secondary | ICD-10-CM

## 2018-01-28 LAB — CBC WITH DIFFERENTIAL/PLATELET
BASOS PCT: 0 %
Basophils Absolute: 0 10*3/uL (ref 0.0–0.1)
EOS ABS: 0 10*3/uL (ref 0.0–0.5)
EOS PCT: 0 %
HCT: 20.7 % — ABNORMAL LOW (ref 36.0–46.0)
Hemoglobin: 6.6 g/dL — CL (ref 12.0–15.0)
LYMPHS ABS: 1.5 10*3/uL (ref 0.7–4.0)
Lymphocytes Relative: 14 %
MCH: 27.7 pg (ref 26.0–34.0)
MCHC: 31.9 g/dL (ref 30.0–36.0)
MCV: 87 fL (ref 80.0–100.0)
MONOS PCT: 2 %
Monocytes Absolute: 0.2 10*3/uL (ref 0.1–1.0)
Neutro Abs: 9 10*3/uL — ABNORMAL HIGH (ref 1.7–7.7)
Neutrophils Relative %: 84 %
PLATELETS: 14 10*3/uL — AB (ref 150–400)
RBC: 2.38 MIL/uL — ABNORMAL LOW (ref 3.87–5.11)
RDW: 20.7 % — AB (ref 11.5–15.5)
WBC: 10.7 10*3/uL — ABNORMAL HIGH (ref 4.0–10.5)
nRBC: 0 % (ref 0.0–0.2)

## 2018-01-28 LAB — COMPREHENSIVE METABOLIC PANEL
ALBUMIN: 2.2 g/dL — AB (ref 3.5–5.0)
ALK PHOS: 61 U/L (ref 38–126)
ALT: 33 U/L (ref 0–44)
AST: 44 U/L — AB (ref 15–41)
Anion gap: 11 (ref 5–15)
BILIRUBIN TOTAL: 0.6 mg/dL (ref 0.3–1.2)
BUN: 128 mg/dL — AB (ref 8–23)
CALCIUM: 9.3 mg/dL (ref 8.9–10.3)
CO2: 10 mmol/L — ABNORMAL LOW (ref 22–32)
CREATININE: 4.63 mg/dL — AB (ref 0.44–1.00)
Chloride: 117 mmol/L — ABNORMAL HIGH (ref 98–111)
GFR calc Af Amer: 10 mL/min — ABNORMAL LOW (ref >60)
GFR, EST NON AFRICAN AMERICAN: 9 mL/min — AB (ref >60)
GLUCOSE: 173 mg/dL — AB (ref 70–99)
Potassium: 6 mmol/L — ABNORMAL HIGH (ref 3.5–5.1)
Sodium: 138 mmol/L (ref 135–145)
TOTAL PROTEIN: 4.6 g/dL — AB (ref 6.5–8.1)

## 2018-01-28 LAB — BLOOD GAS, ARTERIAL
DRAWN BY: 308601
O2 CONTENT: 2 L/min
O2 SAT: 96.7 %
PATIENT TEMPERATURE: 98.4
pH, Arterial: 7.27 — ABNORMAL LOW (ref 7.350–7.450)
pO2, Arterial: 97 mmHg (ref 83.0–108.0)

## 2018-01-28 LAB — BASIC METABOLIC PANEL
Anion gap: 13 (ref 5–15)
BUN: 147 mg/dL — ABNORMAL HIGH (ref 8–23)
CALCIUM: 9.4 mg/dL (ref 8.9–10.3)
CO2: 8 mmol/L — ABNORMAL LOW (ref 22–32)
CREATININE: 4.69 mg/dL — AB (ref 0.44–1.00)
Chloride: 118 mmol/L — ABNORMAL HIGH (ref 98–111)
GFR calc Af Amer: 10 mL/min — ABNORMAL LOW (ref >60)
GFR, EST NON AFRICAN AMERICAN: 9 mL/min — AB (ref >60)
Glucose, Bld: 198 mg/dL — ABNORMAL HIGH (ref 70–99)
Potassium: 6.4 mmol/L (ref 3.5–5.1)
SODIUM: 139 mmol/L (ref 135–145)

## 2018-01-28 LAB — TYPE AND SCREEN
ABO/RH(D): A POS
ANTIBODY SCREEN: NEGATIVE

## 2018-01-28 LAB — LACTIC ACID, PLASMA: LACTIC ACID, VENOUS: 1.6 mmol/L (ref 0.5–1.9)

## 2018-01-28 LAB — POTASSIUM: POTASSIUM: 6.5 mmol/L — AB (ref 3.5–5.1)

## 2018-01-28 MED ORDER — FUROSEMIDE 10 MG/ML IJ SOLN
40.0000 mg | Freq: Once | INTRAMUSCULAR | Status: AC
  Administered 2018-01-28: 40 mg via INTRAVENOUS
  Filled 2018-01-28: qty 4

## 2018-01-28 MED ORDER — FUROSEMIDE 10 MG/ML IJ SOLN
80.0000 mg | Freq: Once | INTRAMUSCULAR | Status: AC
  Administered 2018-01-28: 80 mg via INTRAVENOUS
  Filled 2018-01-28: qty 8

## 2018-01-28 MED ORDER — SODIUM CHLORIDE 0.9% IV SOLUTION
Freq: Once | INTRAVENOUS | Status: AC
  Administered 2018-01-28: 17:00:00 via INTRAVENOUS

## 2018-01-28 MED ORDER — SODIUM POLYSTYRENE SULFONATE 15 GM/60ML PO SUSP
30.0000 g | Freq: Once | ORAL | Status: AC
  Administered 2018-01-28: 30 g via ORAL
  Filled 2018-01-28: qty 120

## 2018-01-28 NOTE — Progress Notes (Signed)
  CRITICAL VALUE ALERT  Critical Value:  potassium 6.4  Date & Time Notied:  01/28/2018 5:29 PM   Provider Notified: Dr Audelia Hives   Orders Received/Actions taken: placed in chart

## 2018-01-28 NOTE — Progress Notes (Addendum)
Upon shift change patient appeared pale and progressively more confused.  Patient had an elevated heart rate (118 pulse) and tachypnea (RR reaching to a rate off 44 bpm).  She displayed increased work of breathing.  Patient received kayexalate for elevated K+ today of 6.4 with no results.  Patient also recently received lasix after a unit of blood transfused with no results.  Potassium rising, creatinine rising. Rapid response was notified. EKG, ABG, and chest xray were obtained.  MD on call for Dr. Johnette Abraham was notified of worsening condition.  Patient will be transferred to ICU. MD will be consulting critical care team. Roderick Pee

## 2018-01-28 NOTE — Progress Notes (Signed)
Pt screaming that she needed air on BIPAP while trying to rip it off.  RT attempted to adjust settings but pt wanted mask taken off.  Pt placed back on 2 LPM [place], Md aware.  RT to monitor and assess as needed.

## 2018-01-28 NOTE — Progress Notes (Signed)
[facility] Progress Note Patient Name: Jeanne Martinez DOB: June 14, 1950 MRN: [id number]   Date of Service  01/28/2018  HPI/Events of Note  Arrives from medical floor s/p rapid response. Multiple issues: 1. Severe Metabolic Acidosis with respiratory compensation. HCO3- = pCO2 = 10, 2. CXR looks wet, 3. Increased WOB. Compensation for metabolic acidosis vs ALI vs both and 4. Hyperkalemia - K+ = 6.4.   eICU Interventions  Will order: 1. BIPAP. 2. Lasix 80 mg IV now.  4. Lactic Acid level now.  5. Repeat CBC post PRBC transfusion. 6. Repeat BMP post kayexalate.      Intervention Category Major Interventions: Acid-Base disturbance - evaluation and management  [name] 01/28/2018, 10:30 PM

## 2018-01-28 NOTE — Consult Note (Addendum)
NAME:  Jeanne Martinez, MRN:  [id number], DOB:  September 26, 1950, LOS: 4 ADMISSION DATE:  01/07/2018, CONSULTATION DATE:  01/29/18 REFERRING MD:  Audelia Hives, CHIEF COMPLAINT:  Shortness of breath, confusion   Brief History   Jeanne Martinez is a 67 year old woman with multiple myeloma, admitted 10/31 for her 4th round chemotherapy after a relapse of her kappa light chain myeloma.  Course complicated by worsening renal failure, volume overload, combined anion gap and non anion gap acidosis, and hyperkalemia.  Goal was to to chemo, improve her renal function, and get her to a compassionate use trial in [place].   Past Medical History  Refractory Multiple myeloma dx 2018 (Refractory light chain myeloma). BM biopsy after third round chemotherapy done 01/23/18.    Anemia of chronic renal failure Hypertension CKD stage III HTN Pancytopenia malnutrition  Portacath placed 09/19/17 Significant Hospital Events   3 days 4 round chemotherapy  Severely worsening clinical status on 11/3: pulmonary edema,  Increased AMS, tachypnea, tachycardia  Hyperkalemia  Severe NAGMA, AGMA  Consults: date of consult/date signed off & final recs:  PCCM 11/3  Procedures (surgical and bedside):    Significant Diagnostic Tests:  BM biopsy 10/29 c/w multiple myeloma  Micro Data:    Antimicrobials:     Subjective:  Feels very fatigued.  Oriented but unable to speak in full sentences or interact much with out falling asleep.  Attempted bipap for assistance with work of breathing but did not tolerate (felt suffocated).    Objective   Blood pressure (!) 145/73, pulse 100, temperature 98.6 F (37 C), temperature source Oral, resp. rate (!) 26, height 5' 3"  (1.6 m), weight 40.7 kg, SpO2 96 %.    FiO2 (%):  [21 %-30 %] 30 %   Intake/Output Summary (Last 24 hours) at 01/28/2018 2328 Last data filed at 01/28/2018 1909 Gross per 24 hour  Intake 2522.07 ml  Output 950 ml  Net 1572.07 ml   Filed Weights   01/26/18 0522  Weight: 40.7 kg    Examination: General: lethargic, arousable and oriented x 2 but minimally interactive. Thinks year 2012  Appears very fatigued.  HENT: NCAT Lungs: CTAB anteriorly  Cardiovascular: tachycardic, no MGR  Abdomen: NT, ND, NBS Extremities: 2 + pitting edema to mid lower leg B Neuro:  oriented x 2, but lethargic, thought year was 2012.   Resolved Hospital Problem list     Assessment & Plan:   Multiple myeloma:  The bone marrow result that she had from Tuesday shows essentially marrow replacement by myeloma.  Followed a course of chemo in 9/19.   Acute renal failure, likely 2/2 underlying multiple myeloma: unfortunately has worsened since admission.  Volume overloaded, minimally responsive to lasix.   Hyperkalemia, uremia, and anion gap acidosis 2/2 AKI.  Anion gap met acidosis and NAGMA: CO2 has dropped to 8, AG only 13  (corrects to 18 with Albumin) .  Lactate only 1.4.    Non anion gap acidosis likely 2/2 hyperchloremia from NS infusions.  Respiratory rate likely combination of compensatory respiratory alkalosis and pulmonary edema.   Kayexelate 19m given at 6pm, now has had 2 BMs at around 1am at the time of my exam.    I discussed the details with her husband at the bedside.  He does not think hemodialysis is the right course for her.  I discussed some temporizing measures we could try tonight, but that it is unlikely her renal function will resolve now to allow correction of the above  metabolic derangements.   We will try to continue to manage for tonight with bumex, lasix infusion, insulin/d50, bicarbonate and HF nasal canula, avoiding any more aggressive uncomfortable interventions given her prognosis.  I discussed that likely we will need to transition care to making her comfortable soon.  He is not ready to change her status to DNI just yet, but we discussed it and he would like to think about it now.    Acute metabolic encephalopathy: due to uremia  and metabolic acidosis. .   Malnutrition: Albumin 2.2 today  Acute on chronic normocytic Anemia: Hb down to 6.6 this am, received 1 u PRBC   Pancytopenia: held platelet transfusion now given volume overload, although her platelets of 5 make her high risk for bleeding.    HFpEF: grade 1 diastolic dysfunction.  EF 55 - 60% in 09/2017   Disposition / Summary of Today's Plan 01/28/18   As detailed above      Diet: npo Pain/Anxiety/Delirium protocol (if indicated): na VAP protocol (if indicated): na DVT prophylaxis: held due to thrombocytopenia GI prophylaxis: na Hyperglycemia protocol:  Mobility: bedrest Code Status: full, for now Family Communication: as above, discussed plan in detail with husband.  Patient was too lethargic/fatigued to participate in conversation much.   Labs   CBC: Recent Labs  Lab 01/23/18 0915 01/12/2018 1017 01/25/18 0518 01/26/18 0500 01/27/18 0520 01/28/18 0605  WBC 9.9 8.0 5.5 8.3 11.9* 10.7*  NEUTROABS 6.6 3.4  --  3.7 6.0 9.0*  HGB 7.5* 7.1* 5.5* 8.8* 7.7* 6.6*  HCT 24.0* 21.3* 16.8* 26.7* 23.9* 20.7*  MCV 86.6 81.3 83.2 85.0 86.6 87.0  PLT 47* 21* 10* 20* 9* 14*    Basic Metabolic Panel: Recent Labs  Lab 01/25/18 0518 01/26/18 0500 01/27/18 0520 01/28/18 0605 01/28/18 1652  NA 137 135 137 138 139  K 4.4 5.0 5.1 6.0* 6.4*  CL 111 110 114* 117* 118*  CO2 18* 15* 13* 10* 8*  GLUCOSE 90 244* 175* 173* 198*  BUN 66* 74* 85* 128* 147*  CREATININE 3.89* 4.32* 4.48* 4.63* 4.69*  CALCIUM 10.3 9.8 9.4 9.3 9.4   GFR: Estimated Creatinine Clearance: 7.5 mL/min (A) (by C-G formula based on SCr of 4.69 mg/dL (H)). Recent Labs  Lab 01/25/18 0518 01/26/18 0500 01/27/18 0520 01/28/18 0605 01/28/18 2226  WBC 5.5 8.3 11.9* 10.7*  --   LATICACIDVEN  --   --   --   --  1.6    Liver Function Tests: Recent Labs  Lab 01/02/2018 1017 01/25/18 0518 01/26/18 0500 01/27/18 0520 01/28/18 0605  AST 44* 33 30 31 44*  ALT 33 28 30 27  33    ALKPHOS 73 52 67 59 61  BILITOT 0.6 0.6 0.6 0.4 0.6  PROT 5.9* 4.9* 5.1* 4.6* 4.6*  ALBUMIN 2.8* 2.6* 2.5* 2.2* 2.2*   No results for input(s): LIPASE, AMYLASE in the last 168 hours. No results for input(s): AMMONIA in the last 168 hours.  ABG    Component Value Date/Time   PHART 7.270 (L) 01/28/2018 2043   PCO2ART BELOW REPORTABLE RANGE 01/28/2018 2043   PO2ART 97.0 01/28/2018 2043   O2SAT 96.7 01/28/2018 2043     Coagulation Profile: Recent Labs  Lab 01/23/18 0915  INR 1.02    Cardiac Enzymes: No results for input(s): CKTOTAL, CKMB, CKMBINDEX, TROPONINI in the last 168 hours.  HbA1C: No results found for: HGBA1C  CBG: No results for input(s): GLUCAP in the last 168 hours.  Admitting History of Present  Illness.    Mr. Pallo is a 67 year old woman with a history of multiple myeloma diagnosed in 2018, admitted to hospital on 01/25/18 for chemotherapy (VD-PACE).  Chemotherapy held on 11/3 (5pm)    Pancytopenia, transfused PRBC and Platelets on admission.    On 11/3: developed tachycardia 106-114, tachypnea 20s, worsening AMS.  Net positive 2.2 L 11/2, 1L 11/3, including 1 U prbc today.  +6L since admission  Total UOP 1350 today, however  Only 300 since 1pm.  Minimal UOP after lasix 80    ABG shows metabolic acidosis.   Baseline Cr earlier this month 1.3-1.8, climbed to 3.89 on admission, cont to elevate to 4.69 today. BUN now up to 147 (was 66 on admission), K has steadily gone from 4.4 to 6.5.    Multiple myeloma HPI: IgA  kappa light chain myeloma (refractory) dx 07/2017.   S/p VD-PACE, 3 cycles in past (per Dr. Antonieta Pert note), last completed 12/24/17 Light chain elevated to 23000 from 3000 last week, usually coincides with decline in renal function per oncology note.    Awaiting possible clinical trial with monoclonal ab (renal function precludes) Chemo complicated by pancytopenia in past, but otherwise tolerated well.   Repeat BM biopsy 01/23/18.      Review of Systems:   Unable to fully assess due to patients mental status.  Difficult for her to interact with lengthy interview -- AMS   + Weight loss.   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   Multiple myeloma dx 2018 (Refractory light chain myeloma) CKD stage III HTN Pancytopenia malnutrition  Portacath placed 09/19/17  Surgical History    Past Surgical History:  Procedure Laterality Date  . IR IMAGING GUIDED PORT INSERTION  09/19/2017  . NO PAST SURGERIES       Social History   Social History   Socioeconomic History  . Marital status: Married    Spouse name: Not on file  . Number of children: Not on file  . Years of education: Not on file  . Highest education level: Not on file  Occupational History  . Occupation: retired    Comment: Designer, television/film set  Social Needs  . Financial resource strain: Not on file  . Food insecurity:    Worry: Not on file    Inability: Not on file  . Transportation needs:    Medical: Not on file    Non-medical: Not on file  Tobacco Use  . Smoking status: Never Smoker  . Smokeless tobacco: Never Used  Substance and Sexual Activity  . Alcohol use: No  . Drug use: No  . Sexual activity: Not on file  Lifestyle  . Physical activity:    Days per week: Not on file    Minutes per session: Not on file  . Stress: Not on file  Relationships  . Social connections:    Talks on phone: Not on file    Gets together: Not on file    Attends religious service: Not on file    Active member of club or organization: Not on file    Attends meetings of clubs or organizations: Not on file    Relationship status: Not on file  . Intimate partner violence:    Fear of  current or ex partner: Not on file    Emotionally abused: Not on file    Physically abused: Not on file    Forced sexual activity: Not on file  Other Topics Concern  . Not on file  Social History Narrative  . Not on file  ,  reports that she has never smoked. She has never used smokeless tobacco. She reports that she does not drink alcohol or use drugs.   Family History   Her family history includes Cancer in her father; Multiple myeloma in her mother.   Allergies No Known Allergies   Home Medications  Prior to Admission medications   Medication Sig Start Date End Date Taking? Authorizing Provider  antiseptic oral rinse (BIOTENE) LIQD 15 mLs by Mouth Rinse route every 4 (four) hours. 10/17/17  Yes Volanda Napoleon, MD  famciclovir (FAMVIR) 250 MG tablet TAKE 1 TABLET BY MOUTH EVERY DAY 10/02/17  Yes Ennever, Rudell Cobb, MD  lidocaine-prilocaine (EMLA) cream Apply 1 application topically as needed. Apply to port one hour before appointment 09/01/17  Yes Ennever, Rudell Cobb, MD  LORazepam (ATIVAN) 0.5 MG tablet Take 1 tablet (0.5 mg total) by mouth every 6 (six) hours as needed (Nausea or vomiting). 10/18/17  Yes Volanda Napoleon, MD  MAGNESIUM SULFATE PO Take 800 mg by mouth 2 (two) times daily.   Yes [provider]  Multiple Vitamin (MULTIVITAMIN WITH MINERALS) TABS tablet Take 1 tablet by mouth daily.   Yes [provider]  ciprofloxacin (CIPRO) 500 MG tablet Take 1 tablet (500 mg total) by mouth daily with breakfast. Patient not taking: Reported on 01/06/2018 12/24/17   Volanda Napoleon, MD  fluconazole (DIFLUCAN) 100 MG tablet Take 1 tablet (100 mg total) by mouth daily. Patient not taking: Reported on 01/11/2018 12/24/17   Volanda Napoleon, MD  ondansetron (ZOFRAN) 8 MG tablet Take 1 tablet (8 mg total) by mouth 2 (two) times daily as needed for refractory nausea / vomiting. Start on the second day after inpatient chemo completed. Patient not taking: Reported on 12/19/2017  10/18/17   Volanda Napoleon, MD  potassium chloride SA (K-DUR,KLOR-CON) 20 MEQ tablet Take 2 tablets (40 mEq total) by mouth daily. Patient not taking: Reported on 12/27/2017 01/05/18   Volanda Napoleon, MD  prochlorperazine (COMPAZINE) 10 MG tablet Take 1 tablet (10 mg total) by mouth every 6 (six) hours as needed (Nausea or vomiting). Patient not taking: Reported on 12/19/2017 10/18/17   Volanda Napoleon, MD  Selinexor, 100 MG Once Weekly, 20 MG TBPK Take 100 mg by mouth once a week. 01/23/18   Volanda Napoleon, MD  sodium bicarbonate/sodium chloride SOLN 1 application by Mouth Rinse route every 4 (four) hours. Patient not taking: Reported on 12/27/2017 12/24/17   Volanda Napoleon, MD    MEDS:  Famciclovir 270m daily  Selinexor 1083mq week Cipro, diflucan (past) Mouth rinse   Emend 15071m0/31 Epoetin alfa 10/31  Cisplatin, cytoxan, etoposide: 10/31-11/2 Doxorubicin 42m37m 10/31-11/2 Decadron 40mg40m10/31-11/2 Lasix 40mg 71m 2 11/3,  80mg I88m15 MVI Aloxi 10/31 Kayexelate 30gm x 1 11/3  Critical care time: 75 min direct patient care, including time spent meeting with family at bedside.

## 2018-01-28 NOTE — Progress Notes (Signed)
[facility] Hematology/Oncology Inpatient Progress Note  Patient Name:  Jeanne Martinez DOB: 1950-09-09  Date of Service: January 28, 2018  Referring Provider: Burney Gauze MD  Consulting Physician: Jeanne Leber MD Hematology/Oncology Weekend Coverage  Reason for Visit: Pretreatment assessment and evaluation prior to her last day, cycle 2 of VD-PACE in the setting of refractory kappa light chain multiple myeloma.  Brief History: Jeanne Martinez is a 67 year old resident of [place] whose past medical history is significant for refractory kappa light chain myeloma identified initially in May, 2019.  She has had progressive renal failure refractory to conventional chemotherapy/immunotherapy.  She was admitted on October 31 for her second cycle of VD-PACE chemotherapy under the direction of Dr. Burney Martinez.  She tolerated cycle 1 of therapy fairly well.  At the time of her admission, however, her renal function was declining.  Her light chain reached a low of 3000.  Several days thereafter however it increased significantly to 23,000.  There was the potential of obtaining a clinical trial with a monoclonal antibody by way protocol in [place]. Unfortunately her renal function and light chain burden and fitness have prevented her from enrolling. She has received aggressive transfusion support both with packed red blood cells and platelets.  Her most recent chest x-ray suggested volume overload and early pulmonary edema.  She was given furosemide which has had modest benefit.  It is with this background she anticipates her final cycle of VD-PACE in the setting of refractory kappa light chain myeloma as outlined above.  Interval History: In the interim since yesterday's visit, she is still short of breath when talking.  Her appetite is poor.  She eats very little.  She reports no unusual headache, dizziness, lightheadedness, syncope, or near syncopal episodes.  She  has no rash or itching.  There is no nausea, vomiting, diarrhea, or constipation.  She has no chest or abdominal pain.  She reports no cough, sore throat, or orthopnea. There is no pain or difficulty in swallowing.  She has no fever, shaking chills, sweats, or flulike symptoms.  She reports no pain syndrome.  Past Medical History:  Diagnosis Date  . Anemia of chronic renal failure, stage 3 (moderate) ([place]) 08/08/2017  . Cancer ([place])    multiple myeloma  . Counseling regarding goals of care 08/08/2017  . Hypertension    patient denies  . 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 Renal disorder    acute renal failure due to myeloma       :   Past Medical History Reviewed        Family History Reviewed       Social History Reviewed  Allergies:  She has no known medical allergies  Current Medications: Current Facility-Administered Medications:  .  0.9 %  sodium chloride infusion (Manually program via Guardrails IV Fluids), 250 mL, Intravenous, Once, Jeanne Martinez, Jeanne Spring, MD .  0.9 %  sodium chloride infusion, , Intravenous, Continuous, Jeanne Martinez, Jeanne Cobb, MD, Last Rate: 50 mL/hr at 01/25/18 0231 .  acetaminophen (TYLENOL) tablet 650 mg, 650 mg, Oral, Once, Jeanne Genera, MD .  antiseptic oral rinse (BIOTENE) solution 15 mL, 15 mL, Mouth Rinse, Q4H, Jeanne Martinez, Jeanne Cobb, MD, 15 mL at 01/25/18 0404 .  famciclovir [facility]) tablet 250 mg, 250 mg, Oral, Daily, Jeanne Martinez, Jeanne R, MD .  furosemide (LASIX) injection 20 mg, 20 mg, Intravenous, Once, Jeanne Martinez, Jeanne Spring, MD .  heparin lock flush 100 unit/mL, 500 Units, Intracatheter, Daily  PRN, Jeanne Genera, MD .  heparin lock flush 100 unit/mL, 250 Units, Intracatheter, PRN, Jeanne Martinez, Jeanne Spring, MD .  lidocaine-prilocaine (EMLA) cream 1 application, 1 application, Topical, PRN, Jeanne Martinez, Jeanne Cobb, MD .  methylPREDNISolone sodium succinate (SOLU-MEDROL) 40 mg/mL injection 40 mg, 40 mg, Intravenous, Once, Jeanne Genera, MD .  sodium bicarbonate/sodium chloride mouthwash [id number], 1 application, Mouth Rinse, Q4H while awake, Jeanne Napoleon, MD, 1 application at 30/86/57 0515 .  sodium chloride flush (NS) 0.9 % injection 10 mL, 10 mL, Intracatheter, PRN, Jeanne Martinez, Jeanne Spring, MD .  sodium chloride flush (NS) 0.9 % injection 3 mL, 3 mL, Intracatheter, PRN, Jeanne Genera, MD  Review of Systems: Constitutional: No fever, sweats, or shaking chills.  Poor appetite and weight deficit. Skin: No rash, scaling, sores, lumps, or jaundice; pallor. HEENT: No visual changes or hearing deficit. Pulmonary: No unusual cough, sore throat, or orthopnea. Breasts: No complaints. Cardiovascular: No coronary artery disease, angina, or myocardial infarction.  No cardiac dysrhythmia, essential hypertension, or dyslipidemia. Gastrointestinal: No indigestion, dysphagia, abdominal pain, diarrhea, or constipation.  No change in bowel habits. Genitourinary: No urinary frequency, urgency, hematuria, or dysuria. Musculoskeletal: No arthralgias or myalgias; no joint swelling, pain, or instability. Hematologic: No bleeding tendency or easy bruisability. Endocrine: No intolerance to heat or cold; no thyroid disease or diabetes mellitus. Vascular: No peripheral arterial or venous thromboembolic disease. Psychological: No anxiety, depression, or mood changes; no mental health illnesses. Neurological: No dizziness, lightheadedness, syncope, or near syncopal episodes; no numbness or tingling in the fingers or toes.  Physical Examination: Vital Signs: BP 148/72    HR 118    RR 20    T 98.6     O2 Sat. 96% Jem Schultheisis thin and frail but fully developed. She looks chronically ill.  She is friendly and cooperative without respiratory compromise at rest. Skin: No rashes, scaling, dryness, jaundice, or itching. HEENT: Head is normocephalic and atraumatic.  Pupils are equal round and reactive to light and accommodation.  Sclerae  are anicteric.  Conjunctivae are pale.  No sinus tenderness nor oropharyngeal lesions.  Lips without cracking or peeling; tongue without mass, inflammation, or nodularity.  Mucous membranes are moist. Neck: Supple and symmetric.  No jugular venous distention or thyromegaly.  Trachea is midline. Lymphatics: No cervical or supraclavicular lymphadenopathy.  No epitrochlear, axillary, or inguinal lymphadenopathy is appreciated. Respiratory/chest: Thorax is symmetrical.  She has bibasilar dry crackles.  Normal excursion and respiratory effort. Back: Symmetric without deformity or tenderness. Cardiovascular: Heart rate and rhythm are regular. Gastrointestinal: Abdomen is soft, nontender; no organomegaly.  Bowel sounds are normoactive.  No masses are appreciated. Extremities: In the lower extremities, there is no asymmetric swelling, erythema, tenderness, or cord formation.  No clubbing, cyanosis, nor edema. Hematologic: No petechiae, hematomas, or ecchymoses. Psychological: She is oriented to person, place, and time; flat affect; memory and cognitive deficit Neurological: Examination was limited.  Gait was not assessed.  She moves all extremities.  No sensation deficit.  Laboratory Results: January 28, 2018  Ref Range & Units 06:05 1d ago  WBC 4.0 - 10.5 K/uL 10.7High   11.9High    RBC 3.87 - 5.11 MIL/uL 2.38Low   2.76Low    Hemoglobin 12.0 - 15.0 g/dL 6.6Low Panic   7.7Low    Comment: This critical result has verified and been called to D TORRES,RN by Marnee Martinez on 11 03 2019 at 0713, and has been read back. CRITICAL RESULTS VERIFIED  HCT 36.0 - 46.0 %  20.7Low   23.9Low    MCV 80.0 - 100.0 fL 87.0  86.6   MCH 26.0 - 34.0 pg 27.7  27.9   MCHC 30.0 - 36.0 g/dL 31.9  32.2   RDW 11.5 - 15.5 % 20.7High   20.2High    Platelets 150 - 400 K/uL 14Low Panic   9Low Panic  CM  Comment: Immature Platelet Fraction may be  clinically indicated, consider  ordering this additional test  [id number]  THIS  CRITICAL RESULT HAS VERIFIED AND BEEN CALLED TO D TORRES,RN BY JACQUELYN HOLMES ON 11 03 2019 AT 0713, AND HAS BEEN READ BACK. CRITICAL RESULTS VERIFIED   nRBC 0.0 - 0.2 % 0.0  0.0   Neutrophils Relative % % 84  51   Neutro Abs 1.7 - 7.7 K/uL 9.0High   6.0   Lymphocytes Relative % 14  22   Lymphs Abs 0.7 - 4.0 K/uL 1.5  2.6   Monocytes Relative % 2  17   Monocytes Absolute 0.1 - 1.0 K/uL 0.2  2.1High    Eosinophils Relative % 0  0   Eosinophils Absolute 0.0 - 0.5 K/uL 0.0  0.0   Basophils Relative % 0  0   Basophils Absolute 0.0 - 0.1 K/uL 0.0  0.0   WBC Morphology  PLASMACYTOID LYMPHS    Comment: Performed at [facility], [place] [address] [place], [place] [address]  Immature Granulocytes   10   Abs Immature Granulocytes       Ref Range & Units 06:05  Sodium 135 - 145 mmol/L 138   Potassium 3.5 - 5.1 mmol/L 6.0High    Chloride 98 - 111 mmol/L 117High    CO2 22 - 32 mmol/L 10Low    Glucose, Bld 70 - 99 mg/dL 173High    BUN 8 - 23 mg/dL 128High    Comment: RESULTS CONFIRMED BY MANUAL DILUTION  Creatinine, Ser 0.44 - 1.00 mg/dL 4.63High    Calcium 8.9 - 10.3 mg/dL 9.3   Total Protein 6.5 - 8.1 g/dL 4.6Low    Albumin 3.5 - 5.0 g/dL 2.2Low    AST 15 - 41 U/L 44High    ALT 0 - 44 U/L 33   Alkaline Phosphatase 38 - 126 U/L 61   Total Bilirubin 0.3 - 1.2 mg/dL 0.6   GFR calc non Af Amer >60 mL/min 9Low    GFR calc Af Amer >60 mL/min 10Low     Diagnostic/Imaging Studies: CHEST - 2 VIEW  COMPARISON:  01/15/2018, 12/22/2017  FINDINGS: Cardiomediastinal silhouette unchanged with the heart borders partially obscured.  Unchanged right IJ port catheter with the tip appearing to terminate superior vena cava.  Low lung volumes. Reticular pattern of opacity with interlobular septal thickening and thickening of the minor fissure. Lateral view demonstrates meniscus in the sulcus.  Osteopenia with no acute displaced fracture identified.  No pneumothorax.  IMPRESSION: Evidence of pulmonary edema and bilateral pleural effusions superimposed on chronic lung changes.  Right IJ port catheter.  Corrie Mckusick D.O. 01/27/2018 15:30  Summary/Assessment: Pretreatment assessment and evaluation prior to her last day, cycle 2 of VD-PACE in the setting of refractory kappa light chain multiple myeloma. Worsening renal failure Volume overload Hyperkalemia Dyspnea on minimal exertion Early dementia and memory deficit  Recommendation/Plan: I discussed the laboratory studies and my findings today with Dr. Marin Martinez. We both agreed that the final day of cytotoxic chemotherapy with RD-PACE should be held. She was transfused PRBCs due to worsening anemia Because of worsening  renal failure, she was given furosemide: 40 mg twice Because of her low platelets, she was transfused one single donor platelet pack. In addition to furosemide, she was given Kayexalate: 30 g once. Salvage hemodialysis was considered previously and felt to be not feasible. Repeat laboratory studies will be obtained in the morning and further decision regarding her disposition by Dr. Marin Martinez. A two-view chest x-ray was requested in the a.m. to assess her volume expansion and role for further diuresis. Her prognosis is poor.  The total time spent discussing the laboratory studies, rationale for holding her final day of chemotherapy, importance of aggressive transfusion support, need for diuresis, and discussion was 30 minutes. At least 50% of that time was spent in face-to-face discussion and answering questions.  This note was dictated using voice activated technology/software.  Unfortunately, typographical errors are not uncommon, and transcription is subject to mistakes and regrettably misinterpretation.  If necessary, clarification of the above information can be discussed with me at any time.   Jeanne Leber MD Hematology/Oncology [place] [address] [place], [place] [address] Office: [phone] HERD: [phone]

## 2018-01-28 NOTE — Progress Notes (Signed)
Follow-up phone call from on call MD received stating that he spoke with primary MD and they are in agreement to hold the chemothrapy infusion scheduled for today until further notice.

## 2018-01-28 NOTE — Significant Event (Signed)
Rapid Response Event Note  Overview: Time Called: 2007 Arrival Time: 2015 Event Type: (elevated K level)  Initial Focused Assessment:  Called by primary nurse due to concern about [place] [facility].  Patient's Potassium levels have continued to climb, with latest being 6.4.  Patient has received Kayexalate but has not had any stools.  Primary nurse also stated that patient has gotten progressively more confused.  Past Medical History:  Anemia, Chronic Renal Failure, Multiple Myeloma, HTN, Kappa light chain deposition disease  On my arrival, pt resting in bed, alert, oriented to name, disoriented to place and time, able to talk in three to four word sentences.  Mild respiratory distress noted with use of accessory muscles.  Respirations at times as high as 36.  Other VS stable with HR 111, and BP 159/86, with O2 Sats 93% on 3L-[place].  Patient able to follow simple commands and has equal grip strengths.  12-lead EKG done which showed sinus tach.  Patient at first stated that she wasn't short of breath but within 5 to 10 minutes when asked again did state that she was having a little difficulty.  Lung sounds diminished with some scattered crackles in the bases.  ABG and CXR ordered.  ABG resulted with a pH of 7.26, pO2 of 97, CO2 was too low to be reported and due to that a Bicarb can not be calculated.  CXR report pending.  Interventions:  Rapid Response Nurse assessment, 12-lead EKG, ABG, CXR  Plan of Care (if not transferred):  Plan to move to SDU d/t respiratory distress  Event Summary:  Dr. Truddie Coco notified at [place], [place] ICU/SD Care Coordinator / Rapid Response Nurse Rapid Response Number:  [phone]

## 2018-01-28 NOTE — Progress Notes (Signed)
Per rounding on call  MD, chemotherapy to be held until primary MD can see the patient in AM.

## 2018-01-29 ENCOUNTER — Other Ambulatory Visit: Payer: Medicare Other

## 2018-01-29 ENCOUNTER — Ambulatory Visit: Payer: Medicare Other | Admitting: Hematology & Oncology

## 2018-01-29 ENCOUNTER — Inpatient Hospital Stay: Payer: Medicare Other

## 2018-01-29 DIAGNOSIS — Z66 Do not resuscitate: Secondary | ICD-10-CM

## 2018-01-29 DIAGNOSIS — N183 Chronic kidney disease, stage 3 (moderate): Secondary | ICD-10-CM

## 2018-01-29 DIAGNOSIS — E872 Acidosis, unspecified: Secondary | ICD-10-CM

## 2018-01-29 DIAGNOSIS — R0603 Acute respiratory distress: Secondary | ICD-10-CM

## 2018-01-29 DIAGNOSIS — E875 Hyperkalemia: Secondary | ICD-10-CM

## 2018-01-29 DIAGNOSIS — D696 Thrombocytopenia, unspecified: Secondary | ICD-10-CM

## 2018-01-29 DIAGNOSIS — R0602 Shortness of breath: Secondary | ICD-10-CM

## 2018-01-29 LAB — TYPE AND SCREEN
ABO/RH(D): A POS
Antibody Screen: NEGATIVE
Unit division: 0
Unit division: 0

## 2018-01-29 LAB — CBC WITH DIFFERENTIAL/PLATELET
Abs Immature Granulocytes: 0.83 10*3/uL — ABNORMAL HIGH (ref 0.00–0.07)
BASOS ABS: 0 10*3/uL (ref 0.0–0.1)
BASOS PCT: 0 %
Basophils Absolute: 0 10*3/uL (ref 0.0–0.1)
Basophils Relative: 0 %
EOS ABS: 0 10*3/uL (ref 0.0–0.5)
EOS PCT: 0 %
Eosinophils Absolute: 0 10*3/uL (ref 0.0–0.5)
Eosinophils Relative: 0 %
HEMATOCRIT: 22 % — AB (ref 36.0–46.0)
HEMATOCRIT: 23.7 % — AB (ref 36.0–46.0)
HEMOGLOBIN: 7.1 g/dL — AB (ref 12.0–15.0)
Hemoglobin: 7.6 g/dL — ABNORMAL LOW (ref 12.0–15.0)
Immature Granulocytes: 9 %
LYMPHS ABS: 1.2 10*3/uL (ref 0.7–4.0)
LYMPHS ABS: 1.3 10*3/uL (ref 0.7–4.0)
LYMPHS PCT: 13 %
Lymphocytes Relative: 13 %
MCH: 28.2 pg (ref 26.0–34.0)
MCH: 28.5 pg (ref 26.0–34.0)
MCHC: 32.1 g/dL (ref 30.0–36.0)
MCHC: 32.3 g/dL (ref 30.0–36.0)
MCV: 87.3 fL (ref 80.0–100.0)
MCV: 88.8 fL (ref 80.0–100.0)
MONOS PCT: 10 %
Monocytes Absolute: 0 10*3/uL — ABNORMAL LOW (ref 0.1–1.0)
Monocytes Absolute: 1 10*3/uL (ref 0.1–1.0)
Monocytes Relative: 0 %
NEUTROS PCT: 87 %
NRBC: 0 % (ref 0.0–0.2)
Neutro Abs: 6.3 10*3/uL (ref 1.7–7.7)
Neutro Abs: 8.7 10*3/uL — ABNORMAL HIGH (ref 1.7–7.7)
Neutrophils Relative %: 68 %
PLATELETS: 5 10*3/uL — AB (ref 150–400)
Platelets: <5 10*3/uL — CL (ref 150–400)
RBC: 2.52 MIL/uL — ABNORMAL LOW (ref 3.87–5.11)
RBC: 2.67 MIL/uL — AB (ref 3.87–5.11)
RDW: 19.6 % — AB (ref 11.5–15.5)
RDW: 19.9 % — ABNORMAL HIGH (ref 11.5–15.5)
WBC: 10 10*3/uL (ref 4.0–10.5)
WBC: 9.2 10*3/uL (ref 4.0–10.5)
nRBC: 0 % (ref 0.0–0.2)

## 2018-01-29 LAB — COMPREHENSIVE METABOLIC PANEL
ALBUMIN: 2.4 g/dL — AB (ref 3.5–5.0)
ALK PHOS: 57 U/L (ref 38–126)
ALT: 26 U/L (ref 0–44)
ALT: 28 U/L (ref 0–44)
ANION GAP: 15 (ref 5–15)
AST: 30 U/L (ref 15–41)
AST: 26 U/L (ref 15–41)
Albumin: 2.3 g/dL — ABNORMAL LOW (ref 3.5–5.0)
Alkaline Phosphatase: 52 U/L (ref 38–126)
Anion gap: 11 (ref 5–15)
BILIRUBIN TOTAL: 0.5 mg/dL (ref 0.3–1.2)
BUN: 167 mg/dL — ABNORMAL HIGH (ref 8–23)
BUN: 144 mg/dL — ABNORMAL HIGH (ref 8–23)
CALCIUM: 9.2 mg/dL (ref 8.9–10.3)
CALCIUM: 9.4 mg/dL (ref 8.9–10.3)
CO2: 10 mmol/L — AB (ref 22–32)
CO2: 10 mmol/L — ABNORMAL LOW (ref 22–32)
CREATININE: 4.81 mg/dL — AB (ref 0.44–1.00)
Chloride: 118 mmol/L — ABNORMAL HIGH (ref 98–111)
Chloride: 118 mmol/L — ABNORMAL HIGH (ref 98–111)
Creatinine, Ser: 4.83 mg/dL — ABNORMAL HIGH (ref 0.44–1.00)
GFR calc Af Amer: 10 mL/min — ABNORMAL LOW (ref >60)
GFR calc non Af Amer: 9 mL/min — ABNORMAL LOW (ref >60)
GFR, EST AFRICAN AMERICAN: 10 mL/min — AB (ref >60)
GFR, EST NON AFRICAN AMERICAN: 8 mL/min — AB (ref >60)
GLUCOSE: 172 mg/dL — AB (ref 70–99)
GLUCOSE: 187 mg/dL — AB (ref 70–99)
Potassium: 6.5 mmol/L (ref 3.5–5.1)
Potassium: 6 mmol/L — ABNORMAL HIGH (ref 3.5–5.1)
SODIUM: 139 mmol/L (ref 135–145)
Sodium: 143 mmol/L (ref 135–145)
TOTAL PROTEIN: 4.6 g/dL — AB (ref 6.5–8.1)
Total Bilirubin: 0.7 mg/dL (ref 0.3–1.2)
Total Protein: 4.5 g/dL — ABNORMAL LOW (ref 6.5–8.1)

## 2018-01-29 LAB — BPAM RBC
Blood Product Expiration Date: 201912012359
Blood Product Expiration Date: 201912082359
ISSUE DATE / TIME: 201910311306
ISSUE DATE / TIME: 201911031653
UNIT TYPE AND RH: 5100
UNIT TYPE AND RH: 600

## 2018-01-29 LAB — BPAM PLATELET PHERESIS
BLOOD PRODUCT EXPIRATION DATE: 201911022359
Blood Product Expiration Date: 201911032359
ISSUE DATE / TIME: 201911021441
UNIT TYPE AND RH: 600
Unit Type and Rh: 5100

## 2018-01-29 LAB — PREPARE PLATELET PHERESIS
UNIT DIVISION: 0
Unit division: 0

## 2018-01-29 LAB — MRSA PCR SCREENING: MRSA by PCR: NEGATIVE

## 2018-01-29 LAB — PREPARE RBC (CROSSMATCH)

## 2018-01-29 MED ORDER — INSULIN REGULAR BOLUS VIA INFUSION
10.0000 [IU] | Freq: Once | INTRAVENOUS | Status: DC
  Filled 2018-01-29: qty 10

## 2018-01-29 MED ORDER — BUMETANIDE 0.25 MG/ML IJ SOLN
1.0000 mg | Freq: Once | INTRAMUSCULAR | Status: AC
  Administered 2018-01-29: 1 mg via INTRAVENOUS
  Filled 2018-01-29: qty 4

## 2018-01-29 MED ORDER — ALBUTEROL SULFATE (2.5 MG/3ML) 0.083% IN NEBU
2.5000 mg | INHALATION_SOLUTION | Freq: Three times a day (TID) | RESPIRATORY_TRACT | Status: DC
  Administered 2018-01-29 (×2): 2.5 mg via RESPIRATORY_TRACT
  Filled 2018-01-29 (×2): qty 3

## 2018-01-29 MED ORDER — MORPHINE SULFATE (PF) 2 MG/ML IV SOLN
1.0000 mg | INTRAVENOUS | Status: DC | PRN
  Administered 2018-01-29 (×3): 2 mg via INTRAVENOUS
  Filled 2018-01-29 (×3): qty 1

## 2018-01-29 MED ORDER — ALBUTEROL SULFATE (2.5 MG/3ML) 0.083% IN NEBU
2.5000 mg | INHALATION_SOLUTION | RESPIRATORY_TRACT | Status: DC
  Administered 2018-01-29: 2.5 mg via RESPIRATORY_TRACT
  Filled 2018-01-29: qty 3

## 2018-01-29 MED ORDER — DEXTROSE 50 % IV SOLN
1.0000 | Freq: Once | INTRAVENOUS | Status: AC
  Administered 2018-01-29: 50 mL via INTRAVENOUS
  Filled 2018-01-29: qty 50

## 2018-01-29 MED ORDER — INSULIN REGULAR HUMAN 100 UNIT/ML IJ SOLN
10.0000 [IU] | INTRAMUSCULAR | Status: AC
  Administered 2018-01-29: 10 [IU] via INTRAVENOUS
  Filled 2018-01-29: qty 10

## 2018-01-29 MED ORDER — ORAL CARE MOUTH RINSE
15.0000 mL | Freq: Two times a day (BID) | OROMUCOSAL | Status: DC
  Administered 2018-01-29: 15 mL via OROMUCOSAL

## 2018-01-29 MED ORDER — LIP MEDEX EX OINT
TOPICAL_OINTMENT | CUTANEOUS | Status: AC
  Administered 2018-01-29: 04:00:00
  Filled 2018-01-29: qty 7

## 2018-01-29 MED ORDER — FUROSEMIDE 10 MG/ML IJ SOLN
8.0000 mg/h | INTRAVENOUS | Status: DC
  Administered 2018-01-29: 8 mg/h via INTRAVENOUS
  Filled 2018-01-29: qty 25
  Filled 2018-01-29: qty 20

## 2018-01-29 MED ORDER — CHLORHEXIDINE GLUCONATE CLOTH 2 % EX PADS: 6.0000 | MEDICATED_PAD | Freq: Every day | CUTANEOUS | Status: DC

## 2018-01-29 MED ORDER — SODIUM BICARBONATE 8.4 % IV SOLN
25.0000 meq | Freq: Once | INTRAVENOUS | Status: AC
  Administered 2018-01-29: 25 meq via INTRAVENOUS
  Filled 2018-01-29: qty 50

## 2018-01-29 NOTE — Progress Notes (Signed)
Orders received from Dr. Duwayne Heck at bedside.  Dr. Duwayne Heck spoke extensively with pt husband.

## 2018-01-29 NOTE — Progress Notes (Signed)
Dr. Oletta Darter aware that Dr. Levin Bacon would like CCM to take full care of patient while in ICU.  Thank you.

## 2018-01-29 NOTE — Progress Notes (Signed)
   01/29/18 1200  Clinical Encounter Type  Visited With Family  Visit Type Initial;Psychological support;Spiritual support;Patient actively dying  Referral From Nurse  Consult/Referral To Chaplain  Spiritual Encounters  Spiritual Needs Emotional;Other (Comment) (Spiritual Care Conversation/Support)  Stress Factors  Patient Stress Factors Not reviewed  Family Stress Factors Major life changes;Loss   I visited with the patient and her family per spiritual care consult. The patient was not awake during my visit. The family stated that they were Catholic and were asking about a [employer]. They decided to contact their [employer]. I informed them of the Chaplain services that we provide.  Please, contact Spiritual Care for further assistance.   Chaplain Shanon Ace M.Div., [facility]

## 2018-01-29 NOTE — Progress Notes (Signed)
 NAME:  Jeanne Martinez, MRN:  [id number], DOB:  03/17/1951, LOS: 5 ADMISSION DATE:  01/11/2018, CONSULTATION DATE:  01/29/18 REFERRING MD:  Ruben, CHIEF COMPLAINT:  Shortness of breath, confusion   Brief History   Jeanne Martinez is a 67 year old woman with multiple myeloma, admitted 10/31 for her 4th round chemotherapy after a relapse of her kappa light chain myeloma.  Course complicated by worsening renal failure, volume overload, combined anion gap and non anion gap acidosis, and hyperkalemia.  Goal was for chemo, improve her renal function, and get her to a compassionate use trial in [place].   Past Medical History  Refractory Multiple myeloma dx 2018 (Refractory light chain myeloma). BM biopsy after third round chemotherapy done 01/23/18.    Anemia of chronic renal failure Hypertension CKD stage III HTN Pancytopenia malnutrition Portacath placed 09/19/17 Significant Hospital Events   3 days 4 round chemotherapy 11/3: Severely worsening clinical status on 11/3: pulmonary edema, Increased AMS, tachypnea, tachycardia, Hyperkalemia, Severe NAGMA, AGMA->critical care consulted 11/4: Family requesting transition to comfort Consults: date of consult/date signed off & final recs:  PCCM 11/3  Procedures (surgical and bedside):    Significant Diagnostic Tests:  BM biopsy 10/29 c/w multiple myeloma  Micro Data:    Antimicrobials:     Subjective:  Appears very labored when awake  Objective   Blood pressure 123/83, pulse (Abnormal) 120, temperature 98.2 F (36.8 C), temperature source Oral, resp. rate (Abnormal) 23, height 5' 3" (1.6 m), weight 40.7 kg, SpO2 94 %.    FiO2 (%):  [21 %-40 %] 40 %   Intake/Output Summary (Last 24 hours) at 01/29/2018 0943 Last data filed at 01/29/2018 0800 Gross per 24 hour  Intake 2619.51 ml  Output 1500 ml  Net 1119.51 ml   Filed Weights   01/26/18 0522  Weight: 40.7 kg    Examination: General: 67-year-old white female currently  resting in bed.  When she is awake she is quite short of breath and anxious HEENT normocephalic atraumatic she does exhibit some temporal wasting mucous membranes are moist no JVD Pulmonary: Tachypneic, basilar rales, mild accessory use with any exertion Cardiac: Tachycardic regular rate Abdomen: Soft nontender Extremities: Lower extremity edema brisk capillary refill strong pulses Neuro: Awakens to verbal request moves extremities generalized weakness GU: Clear yellow urine  Resolved Hospital Problem list     Assessment & Plan:   Multiple myeloma: The bone marrow result that she had from Tuesday shows essentially marrow replacement by myeloma.  Followed a course of chemo in 9/19.  Plan Supportive care DO NOT RESUSCITATE  Acute renal failure, likely 2/2 underlying multiple myeloma -Serum creatinine continues to climb  -Urinary output:  Plan Continuing Lasix drip Strict intake output  Mixed anion gap and non-anion gap metabolic acidosis -Non-anion gap component likely due to hyperchloremia and volume resuscitation efforts -Anion gap component due to renal injury -Deemed not a dialysis candidate Plan No further escalation   Hyperkalemia -She received Kayexalate, potassium has dropped from 6.5-6 Plan Am chemistry   Acute hypoxic respiratory failure in setting of volume overload and pulmonary edema Plan Supplemental oxygen  Cont lasix gtt Morphine for comfort.   Acute encephalopathy: likely 2/2 uremia.  Plan Supportive care  Malnutrition Plan Supportive care   Acute on chronic normocytic Anemia, Pancytopenia Plan No further transfusions  HFpEF: grade 1 diastolic dysfunction.  EF 55 - 60% in 09/2017 Plan Cont lasix   Disposition / Summary of Today's Plan 01/29/18   Long discussion with family   at bedside.  Family requests focus on comfort at this point.  We will offer pastoral care, continue Lasix and oxygen, no escalation.  They understand she will likely pass  away this hospitalization our focus will be primarily comfort     Diet: npo Pain/Anxiety/Delirium protocol (if indicated): na VAP protocol (if indicated): na DVT prophylaxis: held due to thrombocytopenia GI prophylaxis: na Hyperglycemia protocol:  Mobility: bedrest Code Status: full, for now Family Communication: Full DO NOT RESUSCITATE  l Labs   CBC: Recent Labs  Lab 01/26/18 0500 01/27/18 0520 01/28/18 0605 01/29/18 0003 01/29/18 0321  WBC 8.3 11.9* 10.7* 10.0 9.2  NEUTROABS 3.7 6.0 9.0* 8.7* 6.3  HGB 8.8* 7.7* 6.6* 7.6* 7.1*  HCT 26.7* 23.9* 20.7* 23.7* 22.0*  MCV 85.0 86.6 87.0 88.8 87.3  PLT 20* 9* 14* 5* <5*    Basic Metabolic Panel: Recent Labs  Lab 01/27/18 0520 01/28/18 0605 01/28/18 1652 01/28/18 2200 01/29/18 0003 01/29/18 0321  NA 137 138 139  --  139 143  K 5.1 6.0* 6.4* 6.5* 6.5* 6.0*  CL 114* 117* 118*  --  118* 118*  CO2 13* 10* 8*  --  10* 10*  GLUCOSE 175* 173* 198*  --  187* 172*  BUN 85* 128* 147*  --  167* 144*  CREATININE 4.48* 4.63* 4.69*  --  4.81* 4.83*  CALCIUM 9.4 9.3 9.4  --  9.4 9.2   GFR: Estimated Creatinine Clearance: 7.3 mL/min (A) (by C-G formula based on SCr of 4.83 mg/dL (H)). Recent Labs  Lab 01/27/18 0520 01/28/18 0605 01/28/18 2226 01/29/18 0003 01/29/18 0321  WBC 11.9* 10.7*  --  10.0 9.2  LATICACIDVEN  --   --  1.6  --   --     Liver Function Tests: Recent Labs  Lab 01/26/18 0500 01/27/18 0520 01/28/18 0605 01/29/18 0003 01/29/18 0321  AST 30 31 44* 30 26  ALT 30 27 33 28 26  ALKPHOS 67 59 61 57 52  BILITOT 0.6 0.4 0.6 0.7 0.5  PROT 5.1* 4.6* 4.6* 4.5* 4.6*  ALBUMIN 2.5* 2.2* 2.2* 2.4* 2.3*   No results for input(s): LIPASE, AMYLASE in the last 168 hours. No results for input(s): AMMONIA in the last 168 hours.  ABG    Component Value Date/Time   PHART 7.270 (L) 01/28/2018 2043   PCO2ART BELOW REPORTABLE RANGE 01/28/2018 2043   PO2ART 97.0 01/28/2018 2043   O2SAT 96.7 01/28/2018 2043      Coagulation Profile: Recent Labs  Lab 01/23/18 0915  INR 1.02    Cardiac Enzymes: No results for input(s): CKTOTAL, CKMB, CKMBINDEX, TROPONINI in the last 168 hours.  HbA1C: No results found for: HGBA1C  CBG: No results for input(s): GLUCAP in the last 168 hours.  Critical care time:         

## 2018-01-29 NOTE — Progress Notes (Signed)
Unfortunately, Jeanne Martinez has declined significantly over the weekend.  I suppose this should not be all that unexpected.  Her bone marrow biopsy showed that she had 95% replacement of marrow by myeloma.  I think this clearly explains the rapid rise of her Kappa light chain.  Her BUN and creatinine have been going up quickly.  Her potassium has also gone up.  This I think is highly indicative of renal failure.  Despite having some renal insufficiency in the past, her potassium has never been a problem.  In fact, potassium has typically been low.  I think the fact that she cannot excrete potassium is a clear indicator that her kidneys are shutting down because of light chain deposition.  Her labs show her sodium be 143 potassium 6 her bicarb is 10.  Her BUN is 144 and creatinine 4.83.  Her albumin is 2.3.  Her white cell count is 9.2 with a hemoglobin of 7.1 and platelet count less than 5.  I had a long talk with her son and husband.  I told him that I just did not think that her kidney function was going to improve.  In the past, her renal function has ALWAYS gotten better after 3 days of treatment.  The fact that it has not this time is quite concerning and the fact that she now has hyperkalemia is I think indicative of renal failure that is permanent.  I talked to them about end-of-life issues.  I really believe that she would not benefit from being put on life support.  She would not survive CPR if she had a arrhythmia.  Given her frail status and her marked thrombocytopenia, she would have internal bleeding that we would not be able to stop.  After a lengthy discussion, the did not want her to be kept alive heroically.  They want her to be kept comfortable.  I told them that even though we are not going to pursue aggressive life-sustaining measures, that we would still do what we would need to do for her comfort.  As such, she is a DO NOT RESUSCITATE  I was honest with him and told him that I  would be incredibly surprised if she made it this week given the rapid decline in her kidney function.  Since I do not have attending privileges in the ICU, I will see if critical care can take over as attending.  I would still be more than happy to do the death summary when the time comes.  I will have to speak with critical care today to see if they can take over her case.  I think her family would feel very comfortable if she were in the intensive care unit/stepdown unit for the last few days that she is with this.  I will stop any further chemotherapy.  We will D/C the medications that she does not need in my opinion.  I know that Ms. Ventress has done everything that we have asked her to do and then some.  She just has developed incredibly aggressive disease as indicated by the rapid rise of her light chains.  I just want to make sure that she has respect, dignity, and comfort.  I know that the staff in the ICU will do a fantastic job with her.  I know that they will attend to her and also attend to her family.  I just am very sad that we could not get her to clinical trial therapy which might have  helped.  Lattie Haw, MD  2 Timothy 4:6-8

## 2018-01-29 NOTE — Progress Notes (Signed)
Critical potassium notified ccm dr at bedside.

## 2018-01-30 ENCOUNTER — Inpatient Hospital Stay (HOSPITAL_COMMUNITY): Payer: Medicare Other

## 2018-01-30 LAB — BLOOD GAS, ARTERIAL
DRAWN BY: 42246
FIO2: 100
O2 Saturation: 71.8 %
PATIENT TEMPERATURE: 98.6
PCO2 ART: 28.5 mmHg — AB (ref 32.0–48.0)
pO2, Arterial: 71.4 mmHg — ABNORMAL LOW (ref 83.0–108.0)

## 2018-01-30 LAB — KAPPA/LAMBDA LIGHT CHAINS
Kappa free light chain: 24282 mg/L — ABNORMAL HIGH (ref 3.3–19.4)
Kappa, lambda light chain ratio: UNDETERMINED
Lambda free light chains: <1.5 mg/L — ABNORMAL LOW (ref 5.7–26.3)

## 2018-01-30 MED ORDER — MORPHINE SULFATE (PF) 2 MG/ML IV SOLN: 1.0000 mg | INTRAVENOUS | Status: DC | PRN

## 2018-01-30 MED ORDER — MORPHINE SULFATE (PF) 2 MG/ML IV SOLN
2.0000 mg | Freq: Once | INTRAVENOUS | Status: AC
  Administered 2018-01-30: 2 mg via INTRAVENOUS
  Filled 2018-01-30: qty 1

## 2018-01-30 MED ORDER — SODIUM BICARBONATE 8.4 % IV SOLN: 200.0000 meq | Freq: Once | INTRAVENOUS | Status: DC

## 2018-01-30 MED ORDER — FUROSEMIDE 10 MG/ML IJ SOLN: 160.0000 mg | Freq: Once | INTRAVENOUS | Status: DC

## 2018-01-31 ENCOUNTER — Other Ambulatory Visit: Payer: Medicare Other

## 2018-01-31 ENCOUNTER — Encounter (HOSPITAL_COMMUNITY): Payer: Self-pay | Admitting: Hematology & Oncology

## 2018-02-02 ENCOUNTER — Other Ambulatory Visit: Payer: Medicare Other

## 2018-02-15 ENCOUNTER — Encounter: Payer: Self-pay | Admitting: Hematology & Oncology

## 2018-02-25 NOTE — Progress Notes (Signed)
[name] Progress Note Patient Name: Jeanne Martinez DOB: Sep 14, 1950 MRN: [id number]   Date of Service  2018/02/01  HPI/Events of Note  Patient c/o pain - Not due to scheduled dose of Morphine yet. Patient is a DNR.  eICU Interventions  Will order: 1. Morphine 2 mg IV now.      Intervention Category Intermediate Interventions: Pain - evaluation and management  Chanler Mendonca Eugene 2018/02/01, 12:27 AM

## 2018-02-25 NOTE — Progress Notes (Signed)
Spoke with family. They are waiting to see if CDS calls back to say if patient qualifies as an organ donor.

## 2018-02-25 NOTE — Progress Notes (Signed)
Pt's family has all left at this time. Pt is being transported to the morgue.

## 2018-02-25 NOTE — Death Summary Note (Signed)
This is a death summary note for [employer].  She was admitted on 01/23/2018.  Her date of passing was January 31, 2018.  Diagnosis upon death:   1.  Acute renal failure secondary to light chain myeloma 2.  Refractory kappa light chain myeloma 3.  Pancytopenia secondary to marrow infiltration by myeloma 4.  Hyperkalemia secondary to renal failure 5.  Malnutrition secondary to lack of caloric intake 6.  Status post cycle #4 of chemotherapy with VD-PACE  Hospital course:  Ms. Mcnear was admitted to [facility]. on 12/30/2017.  She was admitted for salvage chemotherapy with cycle #4 of VD-PACE.  This was to try to reverse the impending renal failure secondary to rapid light chain accumulation from her refractory kappa light chain myeloma.  She already had her Port-A-Cath accessed in our office.  When she was admitted, she had pancytopenia secondary to myeloma.  She had a bone marrow biopsy done on 29 October.  This showed 95% replacement of her bone marrow by myeloma.  She had complex cytogenetics.  We are we are trying to improve her renal function so that she could qualify for a compassionate use medication.  Her BUN and creatinine when she came in was 53 and 3.53.  Unfortunately, her renal function continued to deteriorate.  All the 31st, her BUN was 66 and creatinine 3.89.  On November 2, her BUN was 85 and creatinine 4.48.  Her potassium started to go up.  On admission, potassium is 4.5.  On 2 November potassium was 5.1.  We started her chemotherapy on the 31st..  We gave her some dose reductions as we had previously.  She required a peripheral IV for blood transfusions.  She was given packed red cells and platelets as needed.  She never had any problems with pain.  There is no nausea or vomiting.  On November 3, her condition deteriorated quickly.  Her labs that morning show potassium is 6.  Her BUN was 128 and creatinine 4.63.  Her bicarb was down to 10.  Her albumin was  2.2.  Thankfully, our on-call doctor, Dr. Ladona Ridgel did a fantastic job.  He helped out tremendously.  He talked to the family.  He got her down to the ICU on the night of the third.  The critical care team saw her on admission.  I came in on the morning of the fourth.  It was apparent that her prognosis was very very grim.  I told the family that I do not think she would make it through the week.  Her electrolytes showed a sodium of 139 potassium 6.5.  Bicarb 10.  BUN was 167 and creatinine 4.81.  Calcium was 9.4 with albumin of 2.4.  Her white cell count was 10.  Her hemoglobin 7.6.  Platelet count was 5000.  I had a long talk with her son and husband.  I thought it was apparent that there was nothing else that we could do to try to reverse her situation.  She was not a candidate for dialysis as she had a condition that was not curable and was progressing rapidly.  Her husband and son said that she would not want to be kept alive by heroic measures.  She would not want to be put on life support.  I thought that if CPR was try, this would cause significant bony fractures given that she was so frail and was losing weight.  As such, we made her a DO NOT RESUSCITATE.  Critical care  took over as attending.  I very much appreciate their input.  She was on a Lasix drip to try to help get rid of some of the fluid.  I do not think this really was all that effective.  Her decline was very quick.  She was made comfortable.  Again, critical care did a wonderful job with her.  She subsequently passed away peacefully at 3 AM on the morning of 02-21-18.  The nurses said that her family was with her.  I am just very disappointed that we could not do more for her to try to get her to the new medication that could help her.  She did everything that we asked her to do.  Overall, she had a decent quality of life while she was getting treated.  With significant sorrow, this is a death summary for [employer].  Lattie Haw, MD  2 Timothy 4:16-18

## 2018-02-25 NOTE — Progress Notes (Signed)
Called E-Link MD to report ABG results.

## 2018-02-25 NOTE — Progress Notes (Signed)
Spoke with [facility]-Link RN re: pt's deteriorating respiratory status and cont exhibits of discomfort. Requested E-Link MD address respiratory concerns. Possibly shoot a CXR to r/o potential reasons for worsening respiratory status for purposes of better management.

## 2018-02-25 NOTE — Progress Notes (Signed)
[facility] Progress Note Patient Name: Jeanne Martinez DOB: 02-22-1951 MRN: [id number]   Date of Service  02-22-2018  HPI/Events of Note  Increased WOB and RR = 32. Oximetry probe being repositioned.   eICU Interventions  Will order: 1. Portable CXR STAT. 2. ABG STAT. 3. Beside nurse will try to get oximetry probe functioning.  4. Will ask ground team to assess at bedside.      Intervention Category Major Interventions: Other:  Sommer,Steven Cornelia Copa February 22, 2018, 1:32 AM

## 2018-02-25 NOTE — Progress Notes (Signed)
Pt's family on phone with [email or web address].

## 2018-02-25 NOTE — Progress Notes (Signed)
Pt in asystole. Pt deceased. Verified by RNx2. Thalia Party, RN and Fara Olden, RN. Jennye Moccasin Charge RN notified. ELink MD paged and notified.

## 2018-02-25 NOTE — Progress Notes (Signed)
Referral made to CDS.

## 2018-02-25 NOTE — Progress Notes (Signed)
E-Link MD Dr. Oletta Darter returned call regarding ABG results, discussed ideas for further management. RN spoke with family re: rapid decline in patient status. New orders received at this time.

## 2018-02-25 NOTE — Progress Notes (Signed)
Elink MD notified that pt death was immanent.

## 2018-02-25 NOTE — Progress Notes (Signed)
Pt with cont c/o pain and discomfort. Pt grimacing and groaning when repositioning. Pt refusing to take po fluids due to discomfort. Paged on call to request one time order for Morphine 2mg  IV once.

## 2018-02-25 DEATH — deceased

## 2018-04-05 ENCOUNTER — Other Ambulatory Visit: Payer: Self-pay | Admitting: Hematology & Oncology

## 2019-10-18 IMAGING — US IR FLUORO GUIDE CV LINE*L*
1 series · 2 of 2 positions shown · non-contrast
Comparison: None

INDICATION: History of multiple myeloma, in need of durable intravenous access
for chemotherapy administration.

EXAM:
IMPLANTED PORT A CATH PLACEMENT WITH ULTRASOUND AND FLUOROSCOPIC
GUIDANCE

[Series 1: ir fluoro guide cv line*left* · 2 of 2 slices shown]
[im 1/2]
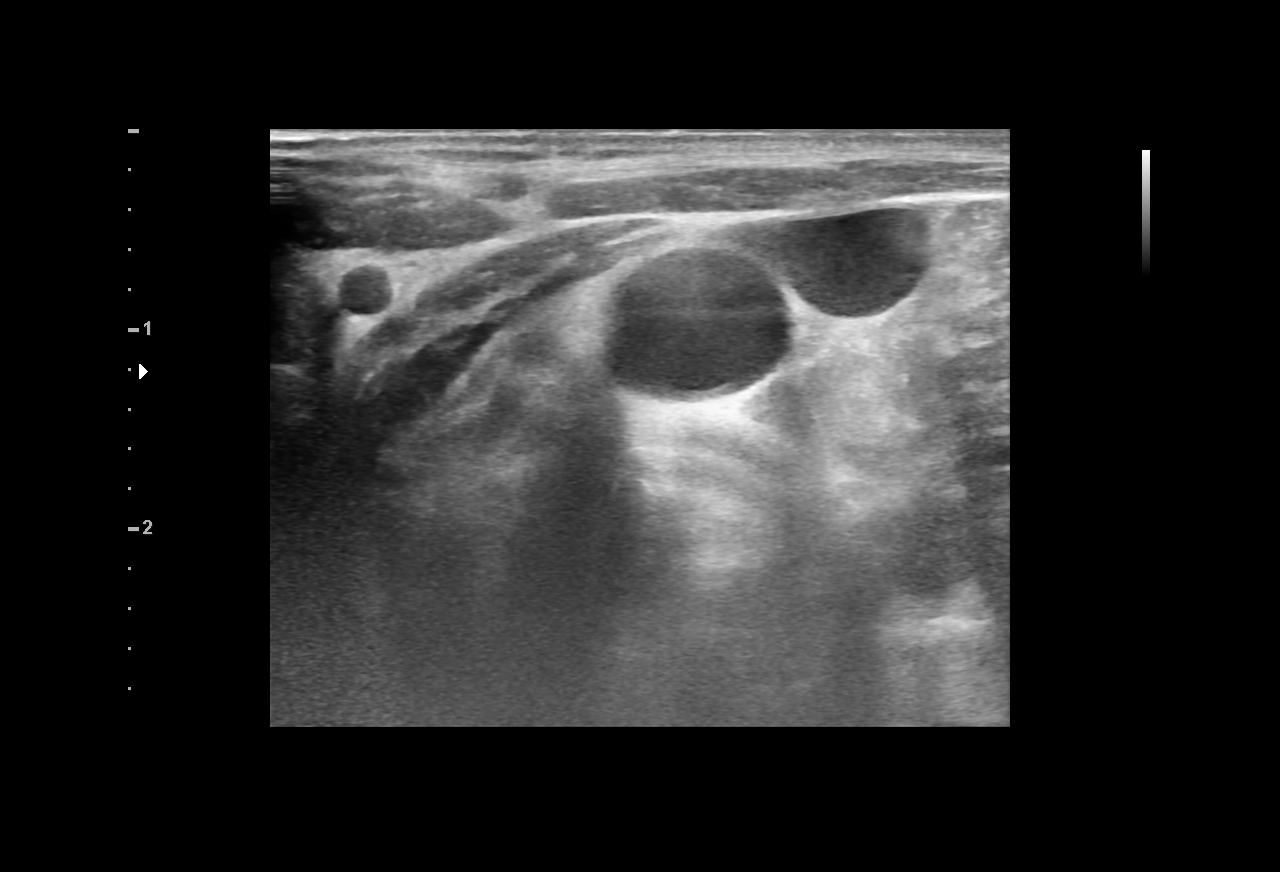
[im 2/2]
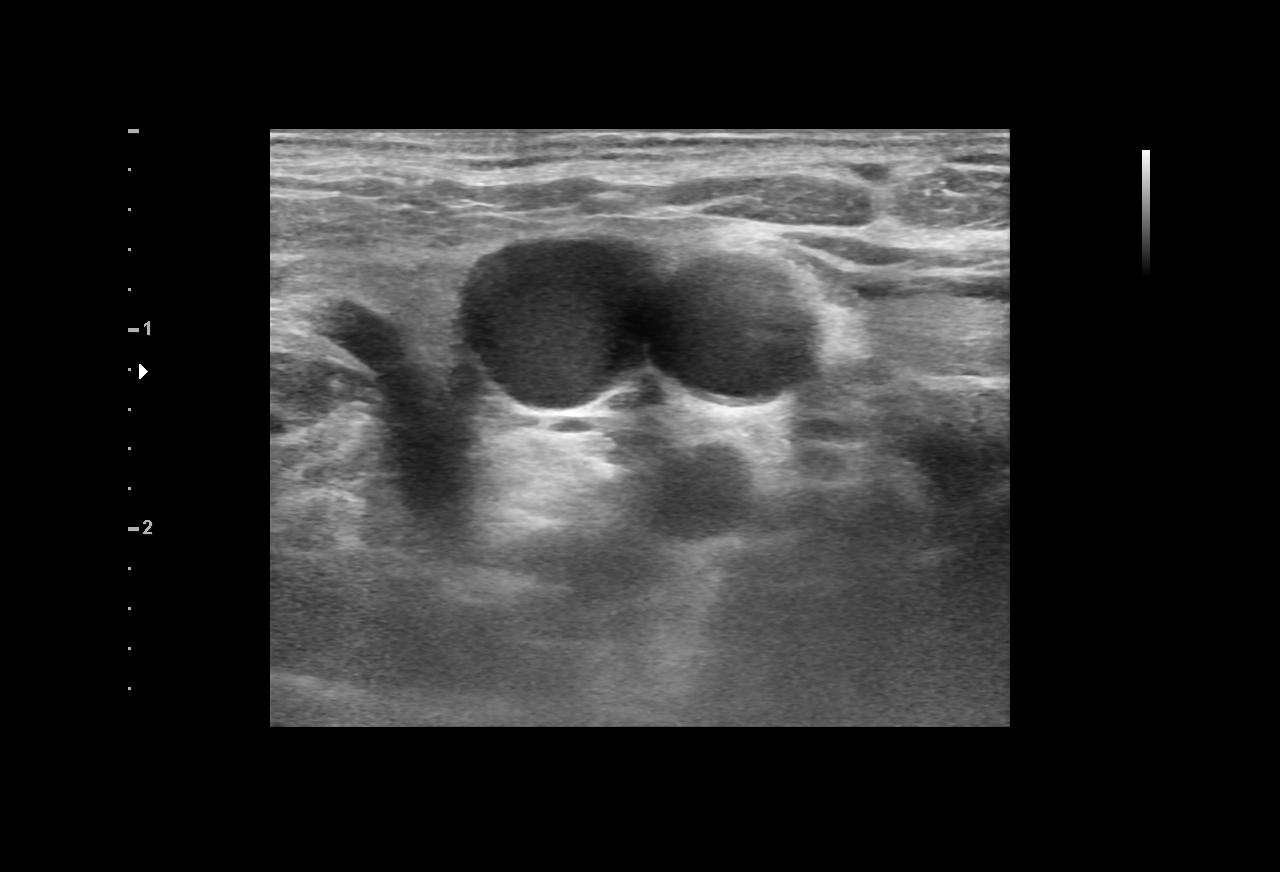

[2 of 2 positions shown; findings below may reference images not displayed]

MEDICATIONS:
Ancef 2 gm IV; The antibiotic was administered within an appropriate
time interval prior to skin puncture.

ANESTHESIA/SEDATION:
Moderate (conscious) sedation was employed during this procedure. A
total of Versed 50 mg and Fentanyl 2 mcg was administered
intravenously.

Moderate Sedation Time: 15 minutes. The patient's level of
consciousness and vital signs were monitored continuously by
radiology nursing throughout the procedure under my direct
supervision.

CONTRAST:  None

FLUOROSCOPY TIME:  18 seconds (1 mGy)

COMPLICATIONS:
None immediate.

PROCEDURE:
The procedure, risks, benefits, and alternatives were explained to
the patient. Questions regarding the procedure were encouraged and
answered. The patient understands and consents to the procedure.

The right neck and chest were prepped with chlorhexidine in a
sterile fashion, and a sterile drape was applied covering the
operative field. Maximum barrier sterile technique with sterile
gowns and gloves were used for the procedure. A timeout was
performed prior to the initiation of the procedure. Local anesthesia
was provided with 1% lidocaine with epinephrine.

After creating a small venotomy incision, a micropuncture kit was
utilized to access the internal jugular vein. Real-time ultrasound
guidance was utilized for vascular access including the acquisition
of a permanent ultrasound image documenting patency of the accessed
vessel. The microwire was utilized to measure appropriate catheter
length.

A subcutaneous port pocket was then created along the upper chest
wall utilizing a combination of sharp and blunt dissection. The
pocket was irrigated with sterile saline. A single lumen thin power
injectable port was chosen for placement. The 8 Fr catheter was
tunneled from the port pocket site to the venotomy incision. The
port was placed in the pocket. The external catheter was trimmed to
appropriate length. At the venotomy, an 8 Fr peel-away sheath was
placed over a guidewire under fluoroscopic guidance. The catheter
was then placed through the sheath and the sheath was removed. Final
catheter positioning was confirmed and documented with a
fluoroscopic spot radiograph. The port was accessed with Don Lolito Onacram
needle, aspirated and flushed with heparinized saline.

The venotomy site was closed with an interrupted 4-0 Vicryl suture.
The port pocket incision was closed with interrupted 2-0 Vicryl
suture. Dermabond and Blain were applied to both incisions.
Dressings were placed. The patient tolerated the procedure well
without immediate post procedural complication.
FINDINGS: After catheter placement, the tip lies within the superior
cavoatrial junction. The catheter aspirates and flushes normally and
is ready for immediate use.
IMPRESSION: Successful placement of a right internal jugular approach power
injectable Port-A-Cath. The catheter is ready for immediate use.

## 2019-11-12 IMAGING — DX DG CHEST 2V
2 series · 2 of 2 positions shown · non-contrast
Comparison: None.

CLINICAL DATA: Shortness of breath.  History of multiple myeloma.

EXAM:
CHEST - 2 VIEW

[chest pa]
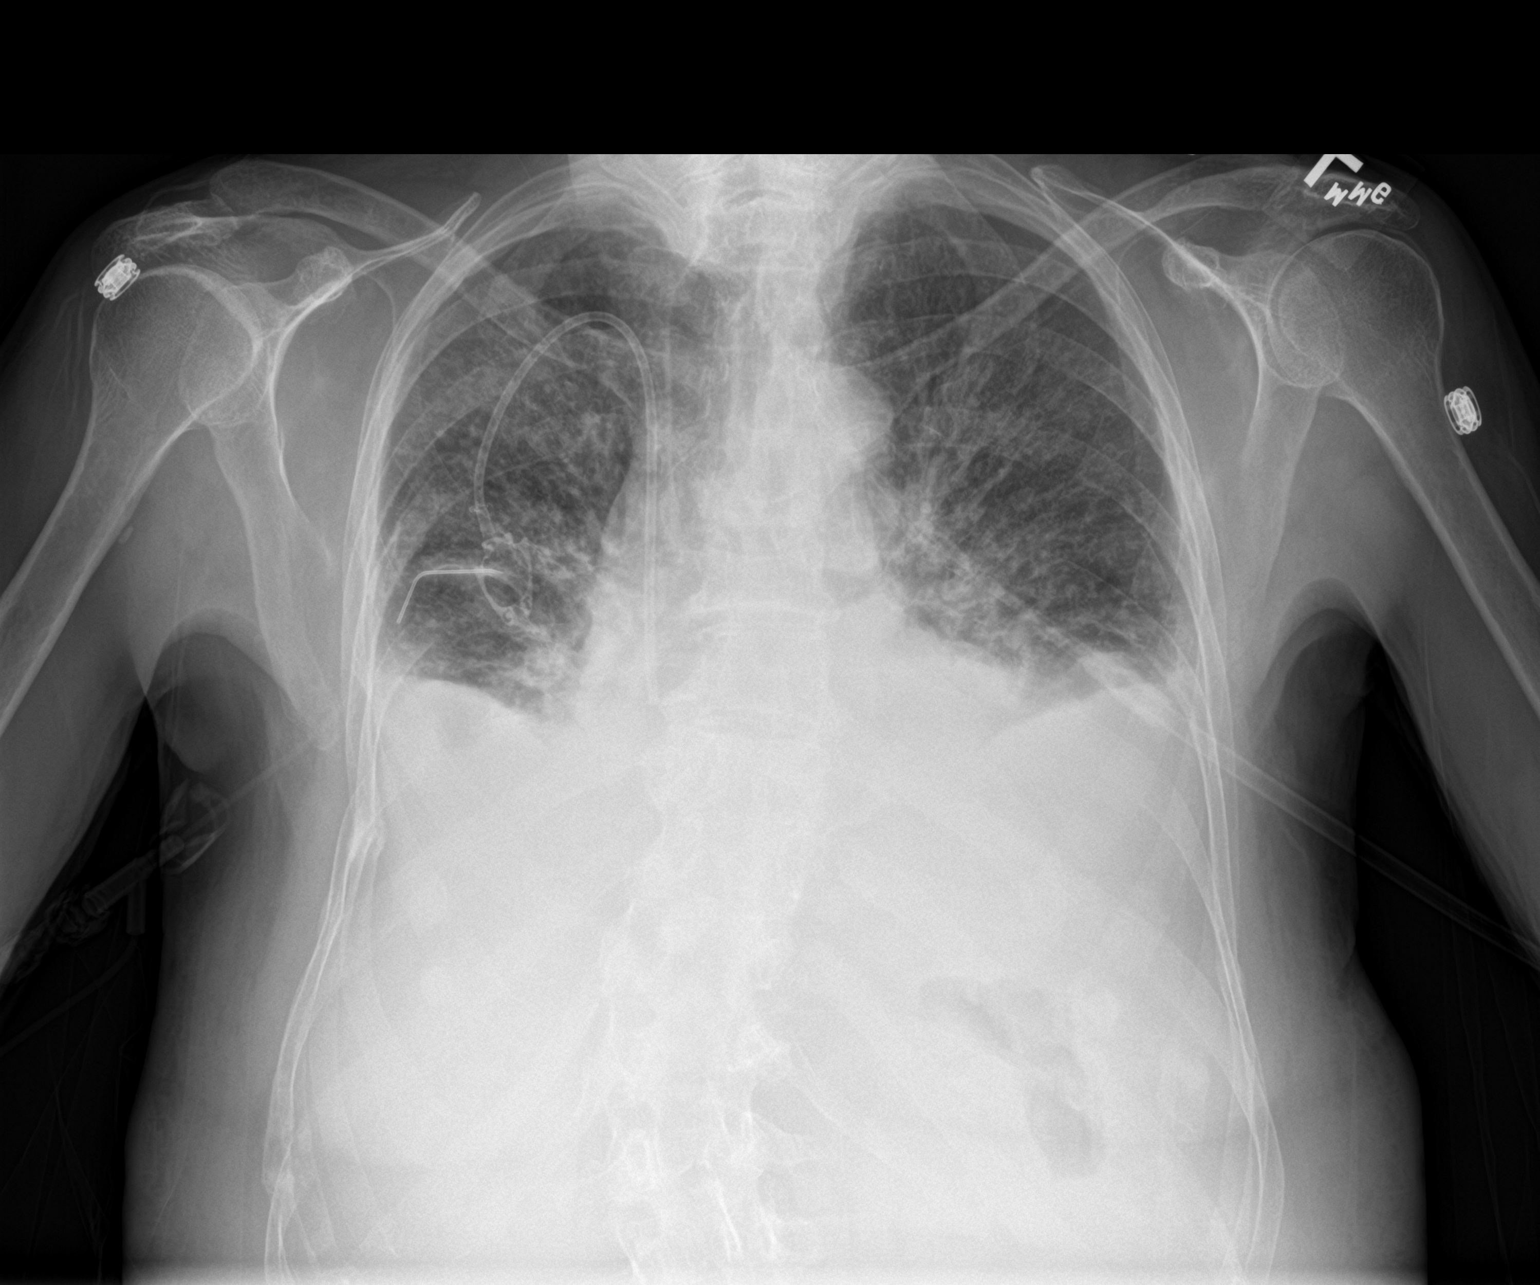

[chest lat]
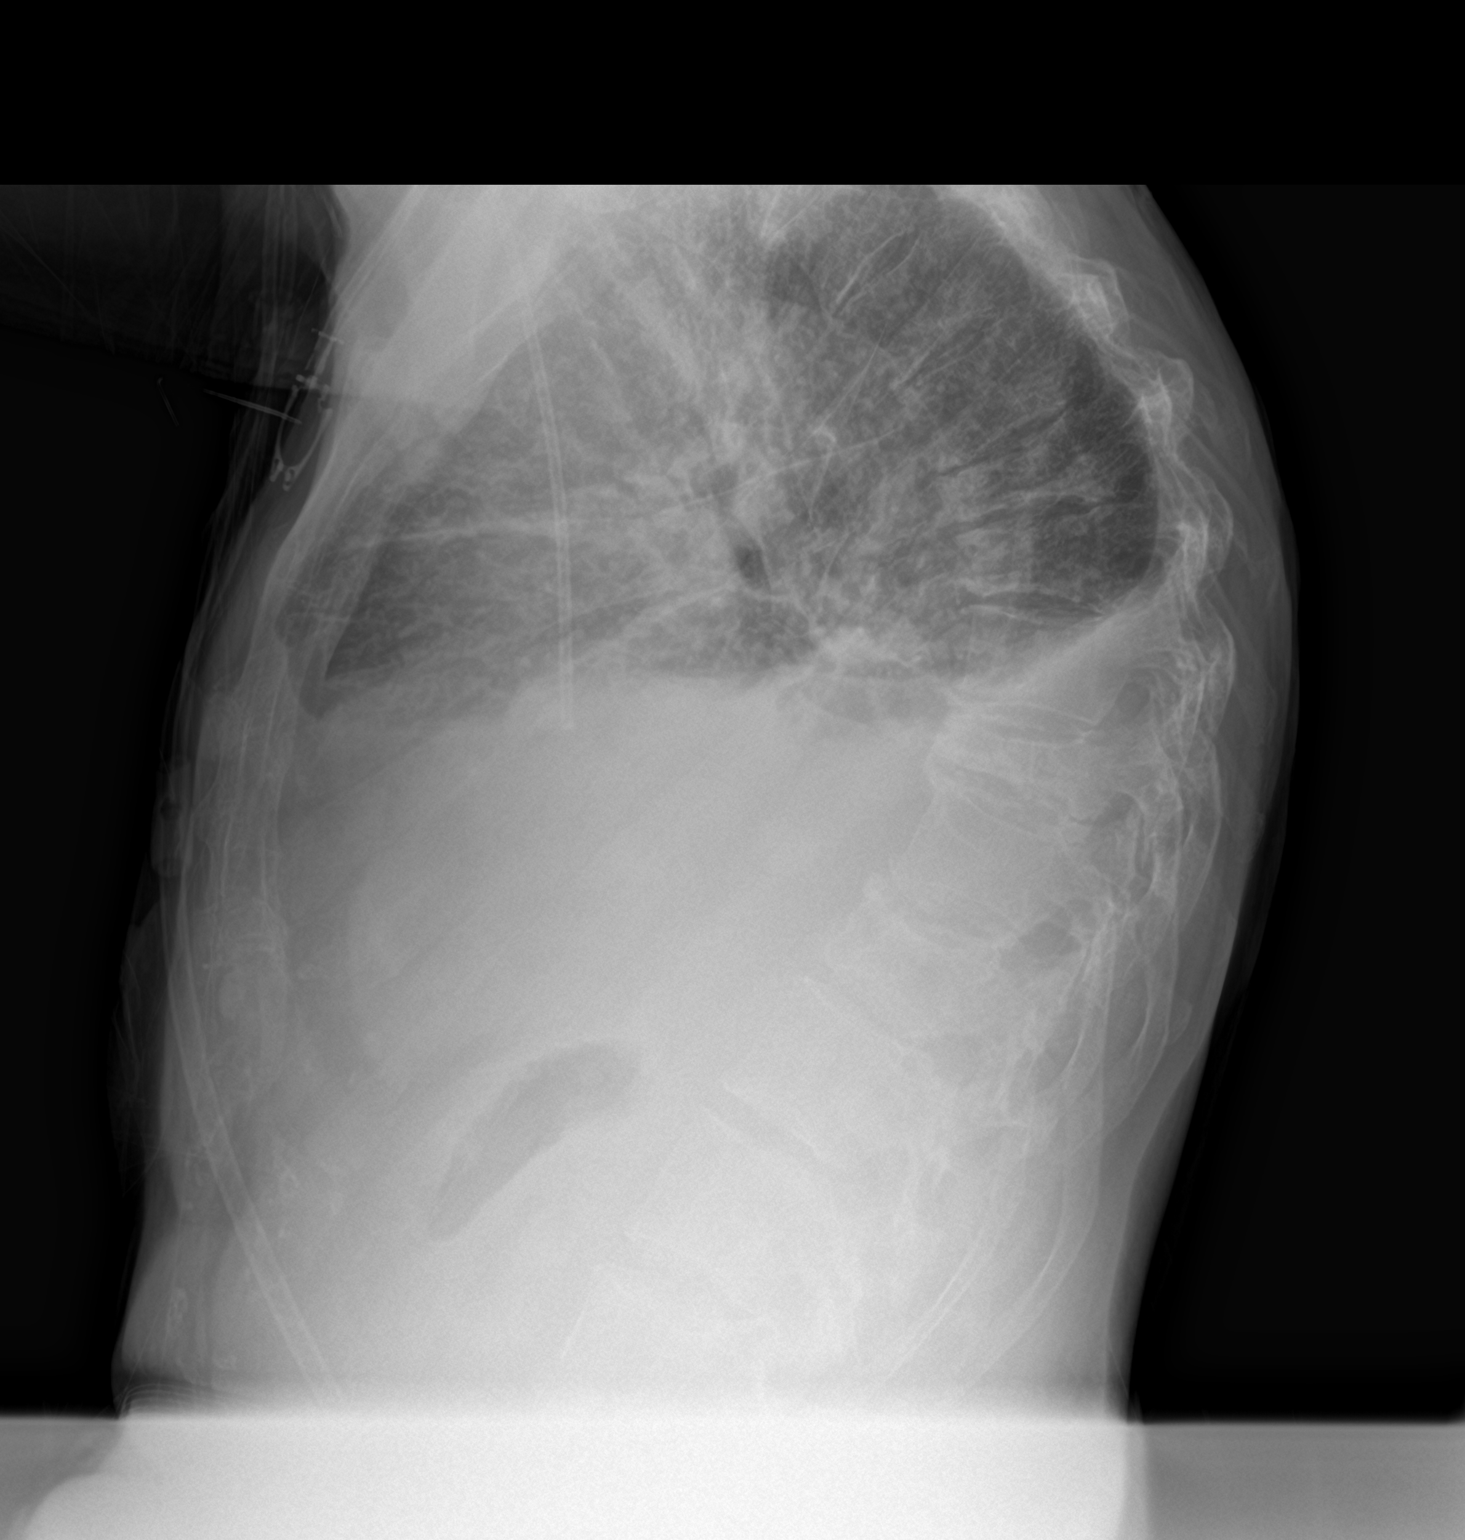

[2 of 2 positions shown; findings below may reference images not displayed]

FINDINGS: The heart is mildly enlarged. There is moderate tortuosity of the
thoracic aorta. The Port-A-Cath is in good position without
complicating features. Low lung volumes with vascular crowding,
streaky bibasilar atelectasis and small pleural effusions. Prominent
vascular markings and interstitial markings along with peribronchial
thickening suggesting an interstitial process such as bronchitis or
pulmonary edema. No focal infiltrates.

Numerous thoracic compression fractures are noted.
IMPRESSION: Cardiac enlargement with probable interstitial edema and pleural
effusions.

Low lung volumes with vascular crowding and basilar atelectasis.
